# Patient Record
Sex: Female | Born: 1950
Health system: Southern US, Community
[De-identification: ages and names within clinical notes are randomized; demographics above are authoritative.]

## PROBLEM LIST (undated history)

## (undated) DIAGNOSIS — H905 Unspecified sensorineural hearing loss: Secondary | ICD-10-CM

## (undated) DIAGNOSIS — Z9889 Other specified postprocedural states: Secondary | ICD-10-CM

## (undated) DIAGNOSIS — K648 Other hemorrhoids: Secondary | ICD-10-CM

## (undated) DIAGNOSIS — K759 Inflammatory liver disease, unspecified: Secondary | ICD-10-CM

## (undated) DIAGNOSIS — K824 Cholesterolosis of gallbladder: Secondary | ICD-10-CM

## (undated) DIAGNOSIS — Z974 Presence of external hearing-aid: Secondary | ICD-10-CM

## (undated) DIAGNOSIS — M1990 Unspecified osteoarthritis, unspecified site: Secondary | ICD-10-CM

## (undated) DIAGNOSIS — K219 Gastro-esophageal reflux disease without esophagitis: Secondary | ICD-10-CM

## (undated) DIAGNOSIS — N8110 Cystocele, unspecified: Secondary | ICD-10-CM

## (undated) DIAGNOSIS — I471 Supraventricular tachycardia, unspecified: Secondary | ICD-10-CM

## (undated) DIAGNOSIS — I10 Essential (primary) hypertension: Secondary | ICD-10-CM

## (undated) DIAGNOSIS — Z86010 Personal history of colonic polyps: Secondary | ICD-10-CM

## (undated) DIAGNOSIS — F419 Anxiety disorder, unspecified: Secondary | ICD-10-CM

## (undated) DIAGNOSIS — G43909 Migraine, unspecified, not intractable, without status migrainosus: Secondary | ICD-10-CM

## (undated) DIAGNOSIS — T7840XA Allergy, unspecified, initial encounter: Secondary | ICD-10-CM

## (undated) DIAGNOSIS — R768 Other specified abnormal immunological findings in serum: Secondary | ICD-10-CM

## (undated) DIAGNOSIS — E162 Hypoglycemia, unspecified: Secondary | ICD-10-CM

## (undated) DIAGNOSIS — R7689 Other specified abnormal immunological findings in serum: Secondary | ICD-10-CM

## (undated) DIAGNOSIS — G4730 Sleep apnea, unspecified: Secondary | ICD-10-CM

## (undated) DIAGNOSIS — N83209 Unspecified ovarian cyst, unspecified side: Secondary | ICD-10-CM

## (undated) DIAGNOSIS — I4729 Other ventricular tachycardia: Secondary | ICD-10-CM

## (undated) DIAGNOSIS — R112 Nausea with vomiting, unspecified: Secondary | ICD-10-CM

## (undated) HISTORY — DX: Unspecified osteoarthritis, unspecified site: M19.90

## (undated) HISTORY — DX: Cholesterolosis of gallbladder: K82.4

## (undated) HISTORY — DX: Supraventricular tachycardia, unspecified: I47.10

## (undated) HISTORY — DX: Other ventricular tachycardia: I47.29

## (undated) HISTORY — DX: Gastro-esophageal reflux disease without esophagitis: K21.9

## (undated) HISTORY — DX: Essential (primary) hypertension: I10

## (undated) HISTORY — DX: Other specified abnormal immunological findings in serum: R76.89

## (undated) HISTORY — DX: Personal history of colonic polyps: Z86.010

## (undated) HISTORY — DX: Sleep apnea, unspecified: G47.30

## (undated) HISTORY — DX: Cystocele, unspecified: N81.10

## (undated) HISTORY — DX: Other hemorrhoids: K64.8

## (undated) HISTORY — DX: Allergy, unspecified, initial encounter: T78.40XA

## (undated) HISTORY — DX: Migraine, unspecified, not intractable, without status migrainosus: G43.909

## (undated) HISTORY — DX: Unspecified ovarian cyst, unspecified side: N83.209

## (undated) HISTORY — PX: SIGMOIDOSCOPY: SUR1295

## (undated) HISTORY — PX: OTHER SURGICAL HISTORY: SHX169

## (undated) HISTORY — DX: Other specified abnormal immunological findings in serum: R76.8

## (undated) HISTORY — PX: COLONOSCOPY: SHX174

---

## 1998-08-03 ENCOUNTER — Encounter: Admission: RE | Admit: 1998-08-03 | Discharge: 1998-11-01 | Payer: Self-pay | Admitting: Family Medicine

## 1999-04-02 ENCOUNTER — Emergency Department (HOSPITAL_COMMUNITY): Admission: EM | Admit: 1999-04-02 | Discharge: 1999-04-03 | Payer: Self-pay

## 1999-04-03 ENCOUNTER — Encounter: Payer: Self-pay | Admitting: Emergency Medicine

## 1999-04-03 DIAGNOSIS — N83209 Unspecified ovarian cyst, unspecified side: Secondary | ICD-10-CM

## 1999-04-03 HISTORY — DX: Unspecified ovarian cyst, unspecified side: N83.209

## 2000-01-16 HISTORY — PX: OOPHORECTOMY: SHX86

## 2000-02-05 ENCOUNTER — Encounter: Admission: RE | Admit: 2000-02-05 | Discharge: 2000-02-05 | Payer: Self-pay | Admitting: *Deleted

## 2000-02-14 ENCOUNTER — Encounter: Admission: RE | Admit: 2000-02-14 | Discharge: 2000-02-14 | Payer: Self-pay | Admitting: *Deleted

## 2000-09-23 ENCOUNTER — Other Ambulatory Visit: Admission: RE | Admit: 2000-09-23 | Discharge: 2000-09-23 | Payer: Self-pay | Admitting: *Deleted

## 2001-08-04 ENCOUNTER — Encounter: Admission: RE | Admit: 2001-08-04 | Discharge: 2001-08-04 | Payer: Self-pay | Admitting: *Deleted

## 2001-09-25 ENCOUNTER — Other Ambulatory Visit: Admission: RE | Admit: 2001-09-25 | Discharge: 2001-09-25 | Payer: Self-pay | Admitting: *Deleted

## 2002-08-14 ENCOUNTER — Encounter: Admission: RE | Admit: 2002-08-14 | Discharge: 2002-08-14 | Payer: Self-pay | Admitting: *Deleted

## 2003-01-06 ENCOUNTER — Other Ambulatory Visit: Admission: RE | Admit: 2003-01-06 | Discharge: 2003-01-06 | Payer: Self-pay | Admitting: *Deleted

## 2004-12-04 ENCOUNTER — Encounter: Payer: Self-pay | Admitting: Cardiovascular Disease

## 2005-03-22 ENCOUNTER — Encounter: Payer: Self-pay | Admitting: Cardiovascular Disease

## 2007-07-18 ENCOUNTER — Encounter: Payer: Self-pay | Admitting: Cardiovascular Disease

## 2009-11-24 ENCOUNTER — Encounter: Admission: RE | Admit: 2009-11-24 | Discharge: 2009-11-24 | Payer: Self-pay | Admitting: Obstetrics & Gynecology

## 2009-11-29 ENCOUNTER — Encounter: Admission: RE | Admit: 2009-11-29 | Discharge: 2009-11-29 | Payer: Self-pay | Admitting: Obstetrics & Gynecology

## 2010-05-03 ENCOUNTER — Encounter: Payer: Self-pay | Admitting: *Deleted

## 2010-05-04 ENCOUNTER — Ambulatory Visit (INDEPENDENT_AMBULATORY_CARE_PROVIDER_SITE_OTHER): Payer: BC Managed Care – PPO | Admitting: Cardiovascular Disease

## 2010-05-04 ENCOUNTER — Encounter: Payer: Self-pay | Admitting: Cardiovascular Disease

## 2010-05-04 DIAGNOSIS — R079 Chest pain, unspecified: Secondary | ICD-10-CM | POA: Insufficient documentation

## 2010-05-04 NOTE — Patient Instructions (Signed)
Your physician has requested that you have a stress echocardiogram. For further information please visit [email or web address]. Please follow instruction sheet as given.   

## 2010-05-04 NOTE — Progress Notes (Signed)
60 yo seen at the request of Shaune Pollack for SSCP.  Reviewed extensive records from [facility].  CRF;s elevated lipids.  Does not want to be on statins because of history of hep C and previously on Zetia.  No trying to manage with diet and exercise.  Indicates family history of CAD but fathers CABG was in 70s/80s.  Records indicate normal funtional study in 2005, 2006, 2009 including cardiac CT 2007 with left dominant circulation and calcium score of 0.  Pain is atypical right sided radiating to neck.  Occurs usually at rest.  Rides a bicycle and no pains.  Has had lots of anxiety in post regarding caring for family members and settling an estate in [place].  No dyspnea, palpiations, syncope or edema.    ROS: Denies fever, malais, weight loss, blurry vision, decreased visual acuity, cough, sputum, SOB, hemoptysis, pleuritic pain, palpitaitons, heartburn, abdominal pain, melena, lower extremity edema, claudication, or rash.   General: Affect appropriate Healthy:  appears stated age HEENT: normal Neck supple with no adenopathy JVP normal no bruits no thyromegaly Lungs clear with no wheezing and good diaphragmatic motion Heart:  S1/S2 no murmur,rub, gallop or click PMI normal Abdomen: benighn, BS positve, no tenderness, no AAA no bruit.  No HSM or HJR Distal pulses intact with no bruits No edema Neuro non-focal Skin warm and dry No muscular weakness  Medications Current Outpatient Prescriptions  Medication Sig Dispense Refill  . acyclovir (ZOVIRAX) 200 MG capsule Take by mouth as needed.        . ALPRAZolam (XANAX) 0.5 MG tablet Take 0.5 mg by mouth at bedtime as needed.        Marland Kitchen aspirin 81 MG tablet Take 81 mg by mouth daily.        Marland Kitchen atenolol (TENORMIN) 50 MG tablet 1/2 tab po bid      . celecoxib (CELEBREX) 100 MG capsule Take 100 mg by mouth daily.        . Flaxseed, Linseed, (FLAX SEEDS) POWD As directed       . Omega-3 Fatty Acids (FISH OIL) 1000 MG CAPS 1 tab po qd       .  SUMAtriptan (IMITREX) 100 MG tablet Take 100 mg by mouth every 2 (two) hours as needed.        . traZODone (DESYREL) 150 MG tablet Take 150 mg by mouth as needed.        . zolpidem (AMBIEN) 10 MG tablet Take 10 mg by mouth at bedtime as needed.        Marland Kitchen DISCONTD: b complex vitamins tablet Take 1 tablet by mouth daily.        Marland Kitchen DISCONTD: Bioflavonoid Products (ESTER C PO) Take by mouth daily.        Marland Kitchen DISCONTD: Coenzyme Q10 (CO Q 10 PO) Take by mouth daily.        Marland Kitchen DISCONTD: FOLIC ACID PO Take by mouth daily.        Marland Kitchen DISCONTD: GARLIC PO Take by mouth daily.        Marland Kitchen DISCONTD: levOCARNitine (CARNITOR) 330 MG tablet Take 330 mg by mouth daily.        Marland Kitchen DISCONTD: milk thistle 175 MG tablet Take 175 mg by mouth daily.        Marland Kitchen DISCONTD: MILK THISTLE PO Take by mouth daily.        Marland Kitchen DISCONTD: Multiple Vitamins-Minerals (ZINC PO) Take by mouth.        . DISCONTD:  Red Yeast Rice Extract (RED YEAST RICE PO) Take by mouth daily.        Marland Kitchen DISCONTD: TRYPTOPHAN PO Take by mouth daily.         Single: No family in area [place] sister.  Mom and dad passed Not working  Exercises regularly.   Former smoker No ETOH  History of anxiety  Allergies Statins  Family History: History reviewed. No pertinent family history.  Social History:   Electrocardiogram:  Sinus bradycardia 55 normal ECG   Assessment and Plan

## 2010-05-04 NOTE — Progress Notes (Signed)
Addended by: Deliah Goody on: 05/04/2010 03:26 PM   Modules accepted: Orders

## 2010-05-04 NOTE — Progress Notes (Signed)
Addended by: Deliah Goody on: 05/04/2010 03:00 PM   Modules accepted: Orders

## 2010-05-04 NOTE — Assessment & Plan Note (Signed)
Atypical pain, with normal ECG.  Multiple previous normal functional studies including CT, myovue and stress echo.  Try to enroll in Promise trial.  If not in CT arm consdier stress echo or ETT

## 2010-05-05 LAB — BASIC METABOLIC PANEL
GFR: 94.57 mL/min (ref >60.00)
Potassium: 3.9 mEq/L (ref 3.5–5.1)
Sodium: 137 mEq/L (ref 135–145)

## 2010-05-08 ENCOUNTER — Other Ambulatory Visit: Payer: Self-pay | Admitting: Cardiovascular Disease

## 2010-05-10 ENCOUNTER — Encounter: Payer: Self-pay | Admitting: Cardiovascular Disease

## 2010-05-19 ENCOUNTER — Encounter: Payer: Self-pay | Admitting: Cardiovascular Disease

## 2010-05-22 ENCOUNTER — Inpatient Hospital Stay (HOSPITAL_COMMUNITY): Admission: RE | Admit: 2010-05-22 | Payer: BC Managed Care – PPO | Source: Ambulatory Visit

## 2010-06-06 ENCOUNTER — Other Ambulatory Visit (HOSPITAL_COMMUNITY): Payer: BC Managed Care – PPO | Admitting: Radiology

## 2010-06-06 ENCOUNTER — Ambulatory Visit (HOSPITAL_COMMUNITY)
Admission: RE | Admit: 2010-06-06 | Discharge: 2010-06-06 | Disposition: A | Discharge status: Home / Self Care | Payer: BC Managed Care – PPO | Source: Ambulatory Visit | Attending: Cardiovascular Disease | Admitting: Cardiovascular Disease

## 2010-06-06 DIAGNOSIS — R079 Chest pain, unspecified: Secondary | ICD-10-CM

## 2010-06-06 DIAGNOSIS — R0989 Other specified symptoms and signs involving the circulatory and respiratory systems: Secondary | ICD-10-CM

## 2010-06-06 MED ORDER — IOHEXOL 350 MG/ML SOLN
80.0000 mL | Freq: Once | INTRAVENOUS | Status: AC | PRN
  Administered 2010-06-06: 80 mL via INTRAVENOUS

## 2010-06-08 ENCOUNTER — Other Ambulatory Visit (HOSPITAL_COMMUNITY): Payer: BC Managed Care – PPO | Admitting: Radiology

## 2011-02-12 ENCOUNTER — Encounter: Payer: Self-pay | Admitting: *Deleted

## 2011-02-21 ENCOUNTER — Ambulatory Visit (INDEPENDENT_AMBULATORY_CARE_PROVIDER_SITE_OTHER): Payer: BC Managed Care – PPO | Admitting: Internal Medicine

## 2011-02-21 ENCOUNTER — Encounter: Payer: Self-pay | Admitting: Internal Medicine

## 2011-02-21 ENCOUNTER — Other Ambulatory Visit (INDEPENDENT_AMBULATORY_CARE_PROVIDER_SITE_OTHER): Payer: BC Managed Care – PPO

## 2011-02-21 DIAGNOSIS — R1319 Other dysphagia: Secondary | ICD-10-CM

## 2011-02-21 DIAGNOSIS — K219 Gastro-esophageal reflux disease without esophagitis: Secondary | ICD-10-CM

## 2011-02-21 LAB — COMPREHENSIVE METABOLIC PANEL
Albumin: 4.2 g/dL (ref 3.5–5.2)
Alkaline Phosphatase: 63 U/L (ref 39–117)
BUN: 22 mg/dL (ref 6–23)
Creatinine, Ser: 0.8 mg/dL (ref 0.4–1.2)
Glucose, Bld: 98 mg/dL (ref 70–99)
Total Bilirubin: 0.3 mg/dL (ref 0.3–1.2)

## 2011-02-21 LAB — CBC WITH DIFFERENTIAL/PLATELET
Basophils Relative: 0.5 % (ref 0.0–3.0)
Eosinophils Relative: 6.3 % — ABNORMAL HIGH (ref 0.0–5.0)
HCT: 37.3 % (ref 36.0–46.0)
MCV: 90.6 fl (ref 78.0–100.0)
Monocytes Absolute: 0.5 10*3/uL (ref 0.1–1.0)
Neutrophils Relative %: 47.7 % (ref 43.0–77.0)
RBC: 4.11 Mil/uL (ref 3.87–5.11)
WBC: 6 10*3/uL (ref 4.5–10.5)

## 2011-02-21 MED ORDER — PEG-KCL-NACL-NASULF-NA ASC-C 100 G PO SOLR: 1.0000 | Freq: Once | ORAL | Status: DC

## 2011-02-21 NOTE — Patient Instructions (Signed)
You have been scheduled for an endoscopy and colonoscopy with propofol. Please follow the written instructions given to you at your visit today. Please pick up your prep at the pharmacy within the next 2-3 days. Your physician has requested that you go to the basement for the following lab work before leaving today: Celiac Panel, CMET, CBC CC: Dr Shaune Pollack

## 2011-02-21 NOTE — Progress Notes (Signed)
Alexis Braun 05/01/1950 MRN [id number]   History of Present Illness:  This is a 61 year old white female who has chronic gastroesophageal reflux for which she takes over-the-counter antacids, Prilosec and ranitidine. She has occasional dysphagia. Her brother-in-law was just diagnosed with stage 3 esophageal cancer and the patient wanted to have an endoscopy to rule out Barrett's esophagus. She used to take up to 6-8 Advil a day while doing her sports. She has crampy abdominal pain and her bowel habits are regular. She thinks she may have irritable bowel syndrome because of excess gas. She takes psyilium fiber daily. She had a colonoscopy in [place] in 2008 but the exam was incomplete per her report. Her reflux occurs daily and is mostly postprandially. She has occasional hoarseness and coughing. Wine and coffee make her symptoms worse. She has a history of hepatitis C at age 4. She has positive hepatitis C antibody but no active virus. Her liver function tests have been repeatedly normal.  Past Medical History  Diagnosis Date  . Chest pain   . Migraines   . Ovarian cyst 04/03/1999  . Positive hepatitis C antibody test     at age 83; has been told that she "self cured" No current or past treatment.  Marland Kitchen GERD (gastroesophageal reflux disease)    Past Surgical History  Procedure Date  . Oophorectomy     Right     reports that she has never smoked. She has never used smokeless tobacco. She reports that she drinks alcohol. She reports that she does not use illicit drugs. family history is negative for Colon cancer. Allergies  Allergen Reactions  . Statins     History of Hepatitis C Patient will not take them        Review of Systems: Positive for intermittent dysphagia. Chest pain. Positive for abdominal pain negative for rectal bleeding  The remainder of the 10 point ROS is negative except as outlined in H&P   Physical Exam: General appearance  Well developed, in no distress. Eyes-  non icteric. HEENT nontraumatic, normocephalic. Mouth no lesions, tongue papillated, no cheilosis. Neck supple without adenopathy, thyroid not enlarged, no carotid bruits, no JVD. Lungs Clear to auscultation bilaterally. Cor normal S1, normal S2, regular rhythm, no murmur,  quiet precordium. Abdomen: Soft, nontender abdomen with normoactive bowel sounds. No distention. Liver edge at costal margin. Rectal: Soft Hemoccult negative stool Extremities no pedal edema. Skin no lesions. No stigmata of chronic liver disease Neurological alert and oriented x 3. Psychological normal mood and affect.  Assessment and Plan:  Problem #1 Chronic gastroesophageal reflux with suggestion of dysphagia may be due to esophagitis or possibly a mild esophageal stricture. She has not taken her medications consistently. It is appropriate to go ahead with an upper endoscopy and biopsies to rule out Barrett's esophagus. I have given her samples of Nexium 40 mg daily. We will plan to check for H. pylori and will also complete small bowel biopsies for villous atrophy.  Problem #2 Colorectal screening. She has symptoms suggestive of irritable bowel syndrome. Because of her age of 32 and history of incomplete colonoscopy, we will proceed with a screening colonoscopy. She will continue her fiber supplements.   02/21/2011 Lina Sar

## 2011-02-22 ENCOUNTER — Telehealth: Payer: Self-pay | Admitting: *Deleted

## 2011-02-22 LAB — CELIAC PANEL 10
Endomysial Screen: NEGATIVE
Gliadin IgG: 4.2 U/mL (ref <20)
IgA: 73 mg/dL (ref 69–380)
Tissue Transglutaminase Ab, IgA: 2.7 U/mL (ref <20)

## 2011-02-22 NOTE — Telephone Encounter (Signed)
Message copied by Daphine Deutscher on Thu Feb 22, 2011  2:36 PM ------      Message from: Hart Carwin      Created: Wed Feb 21, 2011 11:22 PM       Please call pt with normal blood tests

## 2011-02-22 NOTE — Telephone Encounter (Signed)
Spoke with patient and gave her results.

## 2011-02-23 ENCOUNTER — Telehealth: Payer: Self-pay | Admitting: *Deleted

## 2011-02-23 NOTE — Telephone Encounter (Signed)
Message copied by Daphine Deutscher on Fri Feb 23, 2011  3:09 PM ------      Message from: Hart Carwin      Created: Thu Feb 22, 2011  5:54 PM       Please call pt with negative celiac panel

## 2011-02-23 NOTE — Telephone Encounter (Signed)
Patient notified of results as per Dr. Brodie. 

## 2011-03-14 ENCOUNTER — Telehealth: Payer: Self-pay | Admitting: Internal Medicine

## 2011-03-14 NOTE — Telephone Encounter (Signed)
Spoke with patient and advised her that this is okay, just no more of those things until after her procedure on Friday. Patient verbalizes understanding.

## 2011-03-16 ENCOUNTER — Ambulatory Visit (AMBULATORY_SURGERY_CENTER): Payer: BC Managed Care – PPO | Admitting: Internal Medicine

## 2011-03-16 ENCOUNTER — Encounter: Payer: Self-pay | Admitting: Internal Medicine

## 2011-03-16 DIAGNOSIS — R1319 Other dysphagia: Secondary | ICD-10-CM

## 2011-03-16 DIAGNOSIS — K2100 Gastro-esophageal reflux disease with esophagitis, without bleeding: Secondary | ICD-10-CM

## 2011-03-16 DIAGNOSIS — Z1211 Encounter for screening for malignant neoplasm of colon: Secondary | ICD-10-CM

## 2011-03-16 DIAGNOSIS — K219 Gastro-esophageal reflux disease without esophagitis: Secondary | ICD-10-CM

## 2011-03-16 MED ORDER — SODIUM CHLORIDE 0.9 % IV SOLN: 500.0000 mL | INTRAVENOUS | Status: DC

## 2011-03-16 MED ORDER — ESOMEPRAZOLE MAGNESIUM 40 MG PO CPDR: DELAYED_RELEASE_CAPSULE | ORAL | Status: DC

## 2011-03-16 NOTE — Progress Notes (Signed)
Pt passed large amount of air while in the RR  Patient did not experience any of the following events: a burn prior to discharge; a fall within the facility; wrong site/side/patient/procedure/implant event; or a hospital transfer or hospital admission upon discharge from the facility. [phone]) Patient did not have preoperative order for IV antibiotic SSI prophylaxis. [phone])

## 2011-03-16 NOTE — Progress Notes (Signed)
Propofol was administered by Shon Hough, CRNA. Maw  The pt tolerated the egd with dilatation very well. Maw  Pt's heart rate dropped to 32 per [facility], CRNA and she gave the pt Robinal 0.2mg  IV.  Pt's heart increased with in couple of mins to upper 40's. Maw  [phone] hung second bag on normal saline 0.9% 500 ml. Maw  The pt tolerated the colonoscopy very well. Maw

## 2011-03-16 NOTE — Patient Instructions (Signed)
YOU HAD AN ENDOSCOPIC PROCEDURE TODAY AT THE [facility]: Refer to the procedure report that was given to you for any specific questions about what was found during the examination.  If the procedure report does not answer your questions, please call your gastroenterologist to clarify.  If you requested that your care partner not be given the details of your procedure findings, then the procedure report has been included in a sealed envelope for you to review at your convenience later.  YOU SHOULD EXPECT: Some feelings of bloating in the abdomen. Passage of more gas than usual.  Walking can help get rid of the air that was put into your GI tract during the procedure and reduce the bloating. If you had a lower endoscopy (such as a colonoscopy or flexible sigmoidoscopy) you may notice spotting of blood in your stool or on the toilet paper. If you underwent a bowel prep for your procedure, then you may not have a normal bowel movement for a few days.  DIET: FOLLOW DILATION DIET TODAY- SEE HANDOUT.  Drink plenty of fluids but you should avoid alcoholic beverages for 24 hours.  ACTIVITY: Your care partner should take you home directly after the procedure.  You should plan to take it easy, moving slowly for the rest of the day.  You can resume normal activity the day after the procedure however you should NOT DRIVE or use heavy machinery for 24 hours (because of the sedation medicines used during the test).    SYMPTOMS TO REPORT IMMEDIATELY: A gastroenterologist can be reached at any hour.  During normal business hours, 8:30 AM to 5:00 PM Monday through Friday, call [phone].  After hours and on weekends, please call the GI answering service at [phone] who will take a message and have the physician on call contact you.   Following lower endoscopy (colonoscopy or flexible sigmoidoscopy):  Excessive amounts of blood in the stool  Significant tenderness or worsening of abdominal  pains  Swelling of the abdomen that is new, acute  Fever of 100F or higher  Following upper endoscopy (EGD)  Vomiting of blood or coffee ground material  New chest pain or pain under the shoulder blades  Painful or persistently difficult swallowing  New shortness of breath  Fever of 100F or higher  Black, tarry-looking stools  FOLLOW UP: If any biopsies were taken you will be contacted by phone or by letter within the next 1-3 weeks.  Call your gastroenterologist if you have not heard about the biopsies in 3 weeks.  Our staff will call the home number listed on your records the next business day following your procedure to check on you and address any questions or concerns that you may have at that time regarding the information given to you following your procedure. This is a courtesy call and so if there is no answer at the home number and we have not heard from you through the emergency physician on call, we will assume that you have returned to your regular daily activities without incident.  SIGNATURES/CONFIDENTIALITY: You and/or your care partner have signed paperwork which will be entered into your electronic medical record.  These signatures attest to the fact that that the information above on your After Visit Summary has been reviewed and is understood.  Full responsibility of the confidentiality of this discharge information lies with you and/or your care-partner.   Follow up colonoscopy in 10 years  Follow dilation diet today

## 2011-03-16 NOTE — Op Note (Signed)
[facility] [address] [place], [place]  [address]  ENDOSCOPY PROCEDURE REPORT  PATIENT:  Alexis Braun, Alexis Braun  MR#:  [id number] BIRTHDATE:  05-18-1950, 60 yrs. old  GENDER:  female  ENDOSCOPIST:  Hedwig Morton. Juanda Chance, MD Referred by:  Shaune Pollack, M.D.  PROCEDURE DATE:  03/16/2011 PROCEDURE:  EGD with biopsy, 43239, Maloney Dilation of Esophagus ASA CLASS:  Class I INDICATIONS:  dysphagia, cough, heartburn refractory to PPI  MEDICATIONS:   MAC sedation, administered by CRNA, propofol (Diprivan) 200 mg TOPICAL ANESTHETIC:  none  DESCRIPTION OF PROCEDURE:   After the risks benefits and alternatives of the procedure were thoroughly explained, informed consent was obtained.  The LB [id number]-H180 [id number] endoscope was introduced through the mouth and advanced to the second portion of the duodenum, without limitations.  The instrument was slowly withdrawn as the mucosa was fully examined. <<PROCEDUREIMAGES>>  Esophagitis was found in the distal esophagus. Grade 1 esophagitis, 3 mm erosions at g-e junction With standard forceps, a biopsy was obtained and sent to pathology (see image2, image6, and image7).  A hiatal hernia was found (see image1 and image2). 2 cm hh, reducible maloney dilator 84F Maloney dil passed without difficulty  Otherwise the examination was normal (see image3, image4, and image5).    Retroflexed views revealed no abnormalities.    The scope was then withdrawn from the patient and the procedure completed.  COMPLICATIONS:  None  ENDOSCOPIC IMPRESSION: 1) Esophagitis in the distal esophagus 2) Hiatal hernia 3) Otherwise normal examination Grade 1 esophagitis, no stricture, s/p passage of 84F Maloney dilator RECOMMENDATIONS: 1) Anti-reflux regimen to be follow 2) Await biopsy results Nexiem 40 mg 1 po qd  REPEAT EXAM:  In 0 year(s) for.  ______________________________ Hedwig Morton. Juanda Chance, MD  CC:  n. eSIGNED:   Hedwig Morton. Kilani Joffe at 03/16/2011 10:00  AM  Signe Colt, [phone]

## 2011-03-16 NOTE — Op Note (Addendum)
[facility] [address] [place], [place]  [address]  COLONOSCOPY PROCEDURE REPORT  PATIENT:  Alexis, Braun  MR#:  [id number] BIRTHDATE:  07-20-1950, 60 yrs. old  GENDER:  female ENDOSCOPIST:  Hedwig Morton. Juanda Chance, MD REF. BY:  Shaune Pollack, M.D. PROCEDURE DATE:  03/16/2011 PROCEDURE:  Colonoscopy 62952 ASA CLASS:  Class I INDICATIONS:  colorectal cancer screening, average risk pt had an incomplete colon in 2008 in [place] MEDICATIONS:   MAC sedation, administered by CRNA, propofol (Diprivan) 300 mg, Robinul .2 mg IV  DESCRIPTION OF PROCEDURE:   After the risks and benefits and of the procedure were explained, informed consent was obtained. Digital rectal exam was performed and revealed no rectal masses. The LB [id number] endoscope was introduced through the anus and advanced to the cecum, which was identified by both the appendix and ileocecal valve.  The quality of the prep was good, using MoviPrep.  The instrument was then slowly withdrawn as the colon was fully examined. <<PROCEDUREIMAGES>>  FINDINGS:  No polyps or cancers were seen (see image1, image2, image3, image4, and image5).   Retroflexed views in the rectum revealed no abnormalities.    The scope was then withdrawn from the patient and the procedure completed.  COMPLICATIONS:  None ENDOSCOPIC IMPRESSION: 1) No polyps or cancers 2) Normal colonoscopy RECOMMENDATIONS: 1) High fiber diet.  REPEAT EXAM:  In 10 year(s) for.  ______________________________ Hedwig Morton. Juanda Chance, MD  CC:  n. REVISED:  03/16/2011 10:27 AM eSIGNED:   Hedwig Morton. Merton Wadlow at 03/16/2011 10:27 AM  Signe Colt, [phone]

## 2011-03-19 ENCOUNTER — Telehealth: Payer: Self-pay

## 2011-03-19 NOTE — Telephone Encounter (Signed)
Left message on answering machine. 

## 2011-03-27 ENCOUNTER — Encounter: Payer: Self-pay | Admitting: Internal Medicine

## 2011-04-22 ENCOUNTER — Telehealth: Payer: Self-pay | Admitting: Physician Assistant

## 2011-04-22 NOTE — Telephone Encounter (Signed)
Pt called because she had some chest pain. It started after exertion and lasted about 5 minutes. She took an aspirin and the pain resolved without further intervention. She was concerned about her pain and wanted to know if she should come to the ER.  Advised pt that with a recent calcium score of zero and a cardiac CT that did not show any stenosis, the likelihood this is cardiac pain is almost zero. Advised her that pain should be treated and if she wanted further evaluation, she should come to the ER but it would be hard to determine the cause of her pain since it has resolved. Discussed risk factors for DVT/PE but the pt is at very low risk for these. Pt was reassured that her pain was unlikely to be coming from anything life-threatening but still would appreciate a call in am.

## 2011-04-26 ENCOUNTER — Telehealth: Payer: Self-pay | Admitting: Cardiovascular Disease

## 2011-04-26 NOTE — Telephone Encounter (Signed)
Patient called, stating she has been having rt sided chest pain off and on since yesterday 04/25/11.States chest pain is a dull pain and sometimes radiates up into jaw.States she walked 3 miles this morning and noticed chest pain x 2.No sob.States has appointment with Dr.Nishan 05/01/11.No chest pain at present.Advised to keep appointment with Dr.Nishan 05/01/11.Advised to go to ER if needed.

## 2011-04-26 NOTE — Telephone Encounter (Signed)
New msg Pt said she is having chest pain

## 2011-05-01 ENCOUNTER — Encounter: Payer: Self-pay | Admitting: Cardiovascular Disease

## 2011-05-01 ENCOUNTER — Ambulatory Visit (INDEPENDENT_AMBULATORY_CARE_PROVIDER_SITE_OTHER): Payer: BC Managed Care – PPO | Admitting: Cardiovascular Disease

## 2011-05-01 DIAGNOSIS — R079 Chest pain, unspecified: Secondary | ICD-10-CM

## 2011-05-01 DIAGNOSIS — E782 Mixed hyperlipidemia: Secondary | ICD-10-CM | POA: Insufficient documentation

## 2011-05-01 NOTE — Patient Instructions (Signed)
Your physician wants you to follow-up in: YEAR WITH DR Haywood Filler will receive a reminder letter in the mail two months in advance. If you don't receive a letter, please call our office to schedule the follow-up appointment. Your physician recommends that you continue on your current medications as directed. Please refer to the Current Medication list given to you today. Your physician recommends that you return for lab work in: FASTING LIPID LIVER  WILL DO  AT [facility] OFF[facility]  DX 272.4 [phone]

## 2011-05-01 NOTE — Assessment & Plan Note (Signed)
Will get labs this week.  She has chose not to be on statins in the past

## 2011-05-01 NOTE — Assessment & Plan Note (Signed)
Atypical.  Multiple previously normal nuclear studies and cardiac CT most recent 4/12.  ECG reviewed today normal  Observe

## 2011-05-01 NOTE — Progress Notes (Signed)
61 yo seen at the request of Shaune Pollack for SSCP. Reviewed extensive records from [facility]. CRF;s elevated lipids. Does not want to be on statins because of history of hep C and previously on Zetia. No trying to manage with diet and exercise. Indicates family history of CAD but fathers CABG was in 70s/80s. Records indicate normal funtional study in 2005, 2006, 2009 including cardiac CT 2007 with left dominant circulation and calcium score of 0.    Had cardiac CT here 4/12 normal left dominant circulation with calcium score of 0  ROS: Denies fever, malais, weight loss, blurry vision, decreased visual acuity, cough, sputum, SOB, hemoptysis, pleuritic pain, palpitaitons, heartburn, abdominal pain, melena, lower extremity edema, claudication, or rash.  All other systems reviewed and negative  General: Affect appropriate Healthy:  appears stated age HEENT: normal Neck supple with no adenopathy JVP normal no bruits no thyromegaly Lungs clear with no wheezing and good diaphragmatic motion Heart:  S1/S2 no murmur, no rub, gallop or click PMI normal Abdomen: benighn, BS positve, no tenderness, no AAA no bruit.  No HSM or HJR Distal pulses intact with no bruits No edema Neuro non-focal Skin warm and dry No muscular weakness   Current Outpatient Prescriptions  Medication Sig Dispense Refill  . acyclovir (ZOVIRAX) 200 MG capsule Take by mouth as needed.        . ALPRAZolam (XANAX) 0.5 MG tablet Take 0.5 mg by mouth at bedtime as needed.        Marland Kitchen aspirin 81 MG tablet Take 81 mg by mouth daily.        Marland Kitchen atenolol (TENORMIN) 50 MG tablet 1/2 tab po bid      . desoximetasone (TOPICORT) 0.05 % cream Apply topically as directed.      Marland Kitchen esomeprazole (NEXIUM) 40 MG capsule Nexium as directed  30 capsule  10  . estradiol (ESTRACE) 0.1 MG/GM vaginal cream Place vaginally as directed.       . Flaxseed, Linseed, (FLAX SEEDS) POWD Take 1 tablet by mouth daily. As directed      . Omega-3 Fatty Acids  (FISH OIL) 1000 MG CAPS 1 tab po qd       . SUMAtriptan (IMITREX) 100 MG tablet Take 100 mg by mouth as directed.       . traZODone (DESYREL) 50 MG tablet Take 50 mg by mouth as needed.       . zolpidem (AMBIEN) 10 MG tablet Take 10 mg by mouth at bedtime as needed.          Allergies  Statins and Sulfa antibiotics  Electrocardiogram:  NSR rate 61 normal ECG  Assessment and Plan

## 2011-06-04 ENCOUNTER — Other Ambulatory Visit: Payer: Self-pay | Admitting: Dermatology

## 2011-09-21 ENCOUNTER — Other Ambulatory Visit: Payer: Self-pay | Admitting: Obstetrics & Gynecology

## 2011-09-21 DIAGNOSIS — Z1231 Encounter for screening mammogram for malignant neoplasm of breast: Secondary | ICD-10-CM

## 2011-09-24 ENCOUNTER — Ambulatory Visit
Admission: RE | Admit: 2011-09-24 | Discharge: 2011-09-24 | Disposition: A | Discharge status: Home / Self Care | Payer: BC Managed Care – PPO | Source: Ambulatory Visit | Attending: Obstetrics & Gynecology | Admitting: Obstetrics & Gynecology

## 2011-09-24 DIAGNOSIS — Z1231 Encounter for screening mammogram for malignant neoplasm of breast: Secondary | ICD-10-CM

## 2012-01-04 ENCOUNTER — Other Ambulatory Visit: Payer: Self-pay | Admitting: Family Medicine

## 2012-01-04 DIAGNOSIS — R109 Unspecified abdominal pain: Secondary | ICD-10-CM

## 2012-01-16 DIAGNOSIS — K824 Cholesterolosis of gallbladder: Secondary | ICD-10-CM

## 2012-01-16 HISTORY — DX: Cholesterolosis of gallbladder: K82.4

## 2012-01-24 ENCOUNTER — Ambulatory Visit
Admission: RE | Admit: 2012-01-24 | Discharge: 2012-01-24 | Disposition: A | Discharge status: Home / Self Care | Payer: BC Managed Care – PPO | Source: Ambulatory Visit | Attending: Family Medicine | Admitting: Family Medicine

## 2012-01-24 DIAGNOSIS — R109 Unspecified abdominal pain: Secondary | ICD-10-CM

## 2012-01-25 ENCOUNTER — Telehealth: Payer: Self-pay | Admitting: Internal Medicine

## 2012-01-25 NOTE — Telephone Encounter (Signed)
Left a message for patient to call me. 

## 2012-01-29 NOTE — Telephone Encounter (Signed)
Spoke with patient and she is willing to see an extender for abdominal pain, polyp in gall bladder. Scheduled with Mike Gip, PA on 01/29/14 at 10:00 AM

## 2012-01-30 ENCOUNTER — Ambulatory Visit: Payer: BC Managed Care – PPO

## 2012-01-30 ENCOUNTER — Ambulatory Visit (INDEPENDENT_AMBULATORY_CARE_PROVIDER_SITE_OTHER): Payer: BC Managed Care – PPO | Admitting: Physician Assistant

## 2012-01-30 ENCOUNTER — Encounter: Payer: Self-pay | Admitting: Physician Assistant

## 2012-01-30 VITALS — BP 120/70 | HR 68 | Ht 65.0 in | Wt 195.0 lb

## 2012-01-30 DIAGNOSIS — Z8619 Personal history of other infectious and parasitic diseases: Secondary | ICD-10-CM

## 2012-01-30 DIAGNOSIS — K219 Gastro-esophageal reflux disease without esophagitis: Secondary | ICD-10-CM

## 2012-01-30 NOTE — Patient Instructions (Addendum)
Please go to the basement level to have your labs drawn.   We will get back to you with the results.  Make a follow up with Dr. Juanda Chance in 6 months out, ( July 2014) for repeat Ultrasound.

## 2012-01-30 NOTE — Progress Notes (Signed)
Reviewed, it seems like pt ought to be on an daily acid suppressing meds. I will see her in follow up.

## 2012-01-30 NOTE — Progress Notes (Signed)
Subjective:    Patient ID: Alexis Braun, female    DOB: 10-07-1950, 62 y.o.   MRN: [id number]  HPI Alexis Braun is a pleasant 62 year old white female known to Dr. Lina Sar who had undergone colonoscopy in March of 2013 for screening and this was a normal exam. She also had upper endoscopy done to 2 complaints of dysphagia and heartburn was found to have a 2 cm hiatal hernia and mild distal esophagitis with tiny erosions. No stricture was seen but dilated #48 Jerene Dilling due  complaints of dysphagia. Patient took a course of Nexium but does not want  to stay on Nexium due to potential long-term side effects and says she has not been bothered by any heartburn or indigestion recently . She says she has made some lifestyle changes and feels that that has helped. She was seen by her primary care provider within the past month due to complaints of change in her bowel habits with loose her stools change in color of her stool with yellow her stool gas and some urgency For the most part her symptoms have resolved at this point. As part of that workup she underwent an upper abdominal ultrasound which showed a 6 mm gallbladder polyp versus non-shadowing adherent stone was no evidence of gallbladder wall thickening common bile duct normal at 3 mm liver appears normal, pancreas incompletely visualized. Patient also reports that she had history of hepatitis C as a teenager, was not treated as there was no treatment available at that time but had been told many years ago that she cleared th virus. She says she has not had any antibody testing etc. for at least 20 years and is concerned about her liver. She reports that she did have a metabolic panel done about a month ago and was told that she had normal liver enzymes. She is concerned about the possible gallbladder polyp and whether she needs any further treatment    Review of Systems  Constitutional: Negative.   HENT: Negative.   Eyes: Negative.   Respiratory:  Negative.   Cardiovascular: Negative.   Gastrointestinal: Negative.   Genitourinary: Negative.   Musculoskeletal: Negative.   Neurological: Negative.   Hematological: Negative.   Psychiatric/Behavioral: Negative.    Outpatient Encounter Prescriptions as of 01/30/2012  Medication Sig Dispense Refill  . acyclovir (ZOVIRAX) 200 MG capsule Take by mouth as needed.        . ALPRAZolam (XANAX) 0.5 MG tablet Take 0.5 mg by mouth at bedtime as needed.        Marland Kitchen aspirin 81 MG tablet Take 81 mg by mouth daily.        Marland Kitchen atenolol (TENORMIN) 50 MG tablet 1/2 tab po bid      . desoximetasone (TOPICORT) 0.05 % cream Apply topically as directed.      Marland Kitchen esomeprazole (NEXIUM) 40 MG capsule Nexium as directed  30 capsule  10  . estradiol (ESTRACE) 0.1 MG/GM vaginal cream Place vaginally as directed.       . Flaxseed, Linseed, (FLAX SEEDS) POWD Take 1 tablet by mouth daily. As directed      . Omega-3 Fatty Acids (FISH OIL) 1000 MG CAPS 1 tab po qd       . SUMAtriptan (IMITREX) 100 MG tablet Take 100 mg by mouth as directed.       . traZODone (DESYREL) 50 MG tablet Take 50 mg by mouth as needed.       . zolpidem (AMBIEN) 10 MG tablet Take 10  mg by mouth at bedtime as needed.         Allergies  Allergen Reactions  . Statins     History of Hepatitis C Patient will not take them  . Sulfa Antibiotics Other (See Comments)    Pt. Does not remember what happened, just that she had a "reaction"   Patient Active Problem List  Diagnosis  . Chest pain  . Mixed hyperlipidemia  . GERD (gastroesophageal reflux disease)   History  Substance Use Topics  . Smoking status: Never Smoker   . Smokeless tobacco: Never Used  . Alcohol Use: Yes     Comment: occ   family history is negative for Colon cancer.     Objective:   Physical Exam well-developed older white female in no acute distress, pleasant blood pressure 120/70 pulse 68 height 5 foot 5 weight 195. HEENT; nontraumatic normocephalic EOMI PERRLA sclera  anicteric,Neck; Supple; no JVD, Cardiovascular ;regular rate and rhythm with S1-S2, Pulmonary clear bilaterally, Abdomen ;soft nontender nondistended bowel sounds are active there is no palpable mass or hepatosplenomegaly, Rectal ;exam not done, Extremities; no clubbing cyanosis or edema skin warm dry, Psych; mood and affect normal and appropriate        Assessment & Plan:  #31 62 year old female with asymptomatic 6 mm gallbladder polyp versus adherent non-shadowing gallstone. #2 history of GERD with erosive esophagitis currently asymptomatic and opposed to long-term PPI therapy #3 remote history of probable hepatitis C  Plan; discussed management of intermittent reflux symptoms. She is following an antireflux diet and has the head of her bed elevated. We discussed periodic use of. PPI and also alternative of using H2 blocker such as Pepcid or Zantac as needed.  We'll check hepatitis C antibody and hep C PCR Quant  today Plan repeat ultrasound in 6 months to differentiate polyp versus gallstone and assure stability of polyp size if indeed this is a gallbladder polyp Low fat diet Patient will followup with Dr. Juanda Chance in 4-6 months for repeat ultrasound.

## 2012-02-04 LAB — HEPATITIS C RNA QUANTITATIVE: HCV Quantitative: NOT DETECTED IU/mL (ref <15)

## 2012-05-15 ENCOUNTER — Encounter (HOSPITAL_COMMUNITY): Payer: Self-pay | Admitting: *Deleted

## 2012-05-15 ENCOUNTER — Emergency Department (HOSPITAL_COMMUNITY)
Admission: EM | Admit: 2012-05-15 | Discharge: 2012-05-15 | Disposition: A | Discharge status: Home / Self Care | Payer: BC Managed Care – PPO | Attending: Emergency Medicine | Admitting: Emergency Medicine

## 2012-05-15 DIAGNOSIS — G43109 Migraine with aura, not intractable, without status migrainosus: Secondary | ICD-10-CM | POA: Insufficient documentation

## 2012-05-15 DIAGNOSIS — Z8739 Personal history of other diseases of the musculoskeletal system and connective tissue: Secondary | ICD-10-CM | POA: Insufficient documentation

## 2012-05-15 DIAGNOSIS — Z79899 Other long term (current) drug therapy: Secondary | ICD-10-CM | POA: Insufficient documentation

## 2012-05-15 DIAGNOSIS — I10 Essential (primary) hypertension: Secondary | ICD-10-CM | POA: Insufficient documentation

## 2012-05-15 DIAGNOSIS — Z7982 Long term (current) use of aspirin: Secondary | ICD-10-CM | POA: Insufficient documentation

## 2012-05-15 DIAGNOSIS — Z8742 Personal history of other diseases of the female genital tract: Secondary | ICD-10-CM | POA: Insufficient documentation

## 2012-05-15 DIAGNOSIS — Z8619 Personal history of other infectious and parasitic diseases: Secondary | ICD-10-CM | POA: Insufficient documentation

## 2012-05-15 DIAGNOSIS — Z8719 Personal history of other diseases of the digestive system: Secondary | ICD-10-CM | POA: Insufficient documentation

## 2012-05-15 LAB — CBC
HCT: 37.4 % (ref 36.0–46.0)
MCV: 84.6 fL (ref 78.0–100.0)
Platelets: 280 10*3/uL (ref 150–400)
RBC: 4.42 MIL/uL (ref 3.87–5.11)
WBC: 7.2 10*3/uL (ref 4.0–10.5)

## 2012-05-15 LAB — BASIC METABOLIC PANEL
CO2: 25 mEq/L (ref 19–32)
Chloride: 104 mEq/L (ref 96–112)
Creatinine, Ser: 0.73 mg/dL (ref 0.50–1.10)
GFR calc Af Amer: >90 mL/min (ref >90)
Potassium: 4.5 mEq/L (ref 3.5–5.1)

## 2012-05-15 MED ORDER — KETOROLAC TROMETHAMINE 30 MG/ML IJ SOLN
30.0000 mg | Freq: Once | INTRAMUSCULAR | Status: AC
  Administered 2012-05-15: 30 mg via INTRAVENOUS
  Filled 2012-05-15: qty 1

## 2012-05-15 MED ORDER — SODIUM CHLORIDE 0.9 % IV BOLUS (SEPSIS)
1000.0000 mL | Freq: Once | INTRAVENOUS | Status: AC
  Administered 2012-05-15: 1000 mL via INTRAVENOUS

## 2012-05-15 MED ORDER — METOCLOPRAMIDE HCL 5 MG/ML IJ SOLN
10.0000 mg | Freq: Once | INTRAMUSCULAR | Status: AC
  Administered 2012-05-15: 10 mg via INTRAVENOUS
  Filled 2012-05-15: qty 2

## 2012-05-15 NOTE — ED Notes (Signed)
md at bedside  Pt alert and oriented x4. Respirations even and unlabored, bilateral symmetrical rise and fall of chest. Skin warm and dry. In no acute distress. Denies needs.   

## 2012-05-15 NOTE — ED Notes (Signed)
Pt escorted to discharge window. Pt verbalized understanding discharge instructions. In no acute distress.  

## 2012-05-15 NOTE — ED Provider Notes (Signed)
History     CSN: [id number]  Arrival date & time 05/15/12  1038   First MD Initiated Contact with Patient 05/15/12 1047      Chief Complaint  Patient presents with  . facial numbness   . neuro complaints x1 week      HPI Patient reports a history of migraine headaches they're normal or localized in her right trapezius region.  She now presents with 24 hours of sharp burning electric-like pain in her right scalp.  She reports some mild changes in the right eye vision.  She denies double vision.  No ocular pain.  No recent falls or trauma.  Nothing else worsens or improves her pain.  She denies weakness of her upper lower extremities.  No chest pain or shortness of breath.  She states she feels like her right side of her head is "heavy".  No changes in her speech.  No prior history of stroke   Past Medical History  Diagnosis Date  . Chest pain   . Migraines   . Ovarian cyst 04/03/1999  . Positive hepatitis C antibody test     at age 40; has been told that she "self cured" No current or past treatment.  Marland Kitchen GERD (gastroesophageal reflux disease)   . Allergy     sulda  . Arthritis   . Hypertension     on medication    Past Surgical History  Procedure Laterality Date  . Oophorectomy      Right   . Colonoscopy      2007  . Sigmoidoscopy      1999    Family History  Problem Relation Age of Onset  . Colon cancer Neg Hx     History  Substance Use Topics  . Smoking status: Never Smoker   . Smokeless tobacco: Never Used  . Alcohol Use: Yes     Comment: occ    OB History   Grav Para Term Preterm Abortions TAB SAB Ect Mult Living                  Review of Systems  All other systems reviewed and are negative.    Allergies  Statins and Sulfa antibiotics  Home Medications   Current Outpatient Rx  Name  Route  Sig  Dispense  Refill  . ALPRAZolam (XANAX) 0.5 MG tablet   Oral   Take 0.5 mg by mouth at bedtime as needed.           Marland Kitchen aspirin 81 MG tablet  Oral   Take 81 mg by mouth daily.           Marland Kitchen atenolol (TENORMIN) 50 MG tablet      1/2 tab po bid         . estradiol (ESTRACE) 0.1 MG/GM vaginal cream   Vaginal   Place vaginally as directed.          . Flaxseed, Linseed, (FLAX SEEDS) POWD   Oral   Take 1 tablet by mouth daily. As directed         . Omega-3 Fatty Acids (FISH OIL) 1000 MG CAPS      1 tab po qd          . traZODone (DESYREL) 50 MG tablet   Oral   Take 50 mg by mouth as needed.            BP 125/93  Pulse 52  Temp(Src) 98.7 F (37.1 C) (Oral)  Resp 17  SpO2 99%  Physical Exam  Nursing note and vitals reviewed. Constitutional: She is oriented to person, place, and time. She appears well-developed and well-nourished. No distress.  HENT:  Head: Normocephalic and atraumatic.  Eyes: EOM are normal. Pupils are equal, round, and reactive to light.  Neck: Normal range of motion.  Cardiovascular: Normal rate, regular rhythm and normal heart sounds.   Pulmonary/Chest: Effort normal and breath sounds normal.  Abdominal: Soft. She exhibits no distension. There is no tenderness.  Musculoskeletal: Normal range of motion.  Neurological: She is alert and oriented to person, place, and time.  5/5 strength in major muscle groups of  bilateral upper and lower extremities. Speech normal. No facial asymetry.  Normal finger to nose bilaterally  Skin: Skin is warm and dry.  Psychiatric: She has a normal mood and affect. Judgment normal.    ED Course  Procedures (including critical care time)   Date: 05/15/2012  Rate: 54  Rhythm: normal sinus rhythm  QRS Axis: normal  Intervals: normal  ST/T Wave abnormalities: normal  Conduction Disutrbances: none  Narrative Interpretation:   Old EKG Reviewed: No prior EKG available    Labs Reviewed  BASIC METABOLIC PANEL - Abnormal; Notable for the following:    Glucose, Bld 101 (*)    All other components within normal limits  CBC   No results found.   No  diagnosis found.    MDM  1:48 PM Complete resolution of all of her symptoms after migraine cocktail.  Discharge home in good condition.  On neurologic exam.  PCP and neurology outpatient followup.        Lyanne Co, MD 05/15/12 1351

## 2012-05-15 NOTE — ED Notes (Signed)
Pt negative for stroke scale. Pt reports 1 week ago felt like "she put her hand in a socket/ electrical tingling in her head". Then pt traveled, approx 5 hours in airplane. For 2-3 days pt has had right sided head "heaviness". Today pt woke up and has had right sided facial tingling since 0800. Denies pain. Smile symmetrical, bil hand grips and leg pushes strong and equal. No slurred speech.

## 2012-05-15 NOTE — ED Notes (Signed)
Bed:WA16<BR> Expected date:<BR> Expected time:<BR> Means of arrival:<BR> Comments:<BR>

## 2012-06-25 ENCOUNTER — Encounter: Payer: Self-pay | Admitting: Cardiovascular Disease

## 2012-07-15 ENCOUNTER — Other Ambulatory Visit: Payer: Self-pay | Admitting: Family Medicine

## 2012-07-15 DIAGNOSIS — K824 Cholesterolosis of gallbladder: Secondary | ICD-10-CM

## 2012-07-23 ENCOUNTER — Other Ambulatory Visit: Payer: Self-pay | Admitting: Family Medicine

## 2012-07-23 ENCOUNTER — Telehealth: Payer: Self-pay | Admitting: Internal Medicine

## 2012-07-23 ENCOUNTER — Ambulatory Visit
Admission: RE | Admit: 2012-07-23 | Discharge: 2012-07-23 | Disposition: A | Discharge status: Home / Self Care | Payer: BC Managed Care – PPO | Source: Ambulatory Visit | Attending: Family Medicine | Admitting: Family Medicine

## 2012-07-23 DIAGNOSIS — K824 Cholesterolosis of gallbladder: Secondary | ICD-10-CM

## 2012-07-23 NOTE — Telephone Encounter (Signed)
Patient is calling for ultrasound results. Please, advise. 

## 2012-07-23 NOTE — Telephone Encounter (Signed)
From: Hart Carwin, MD Sent: 07/23/2012 4:37 PM To: Richardson Chiquito, CMA Please call pt with rwsults of ultrasound: gall bladder polyp again noted but appears larger than on last exm ( 6 to 9 mm)/ Please repeat ultrasound in 6 months ----- Message ----- From: Richardson Chiquito, CMA Sent: 07/23/2012 4:31 PM To: Hart Carwin, MD

## 2012-07-24 ENCOUNTER — Encounter: Payer: Self-pay | Admitting: *Deleted

## 2012-07-24 NOTE — Telephone Encounter (Signed)
Patient given results. She would like to come in and discuss results with Dr. Juanda Chance.She is concerned about the increase in size of GB polyp. Scheduled on 08/05/12 at 8:45 AM.

## 2012-07-24 NOTE — Telephone Encounter (Signed)
OK 

## 2012-08-05 ENCOUNTER — Telehealth (INDEPENDENT_AMBULATORY_CARE_PROVIDER_SITE_OTHER): Payer: Self-pay | Admitting: General Surgery

## 2012-08-05 ENCOUNTER — Encounter: Payer: Self-pay | Admitting: Internal Medicine

## 2012-08-05 ENCOUNTER — Other Ambulatory Visit (INDEPENDENT_AMBULATORY_CARE_PROVIDER_SITE_OTHER): Payer: BC Managed Care – PPO

## 2012-08-05 ENCOUNTER — Ambulatory Visit (INDEPENDENT_AMBULATORY_CARE_PROVIDER_SITE_OTHER): Payer: BC Managed Care – PPO | Admitting: Internal Medicine

## 2012-08-05 ENCOUNTER — Ambulatory Visit (INDEPENDENT_AMBULATORY_CARE_PROVIDER_SITE_OTHER): Payer: BC Managed Care – PPO | Admitting: Surgery

## 2012-08-05 VITALS — BP 132/80 | HR 64 | Ht 65.0 in | Wt 187.8 lb

## 2012-08-05 DIAGNOSIS — R932 Abnormal findings on diagnostic imaging of liver and biliary tract: Secondary | ICD-10-CM

## 2012-08-05 DIAGNOSIS — K824 Cholesterolosis of gallbladder: Secondary | ICD-10-CM

## 2012-08-05 LAB — HEPATIC FUNCTION PANEL
ALT: 21 U/L (ref 0–35)
AST: 21 U/L (ref 0–37)
Alkaline Phosphatase: 76 U/L (ref 39–117)
Total Bilirubin: 0.4 mg/dL (ref 0.3–1.2)

## 2012-08-05 NOTE — Telephone Encounter (Signed)
LMOM for patient to call and ask for [facility] patient needs to R/S

## 2012-08-05 NOTE — Patient Instructions (Addendum)
Your physician has requested that you go to the basement for the following lab work before leaving today: Hepatic Function Panel  CC: Dr Shaune Pollack, Dr Harley Alto Corliss Skains

## 2012-08-05 NOTE — Progress Notes (Signed)
Alexis Braun 05-Aug-1950 MRN [id number]   History of Present Illness:  This is a 62 year old white female who is here to discuss a laparoscopic cholecystectomy. She had a recent upper abdominal ultrasound on 07/15/2012 which showed 8 mm polyps with irregular margins. The gallbladder wall itself was normal and the common bile duct was 3 mm. The liver was borderline enlarged but her splenic size was normal at 6.2 cm. A previous ultrasound in March 2013 showed a 6 mm gallbladder polyp versus non-shadowing stone. She has a history of hepatitis C as a teenager but her hepatitis C RNA by PCR is nondetectable. Her liver function tests and sprue profile were both negative. She has a lot of dyspepsia. She is on a strict low-fat diet to control her symptoms. An upper endoscopy in March 2013 showed 2 cm hiatal hernia and grade 1 esophagitis. She was dilated to 48 [place] dilator. She does not take any acid reducing agents other than Gaviscon. She had a normal colonoscopy in March 2013. She would like to undergo a cholecystectomy. There is no family history of gallbladder disease but she feels that her symptoms are most likely related to her gallbladder.   Past Medical History  Diagnosis Date  . Chest pain   . Migraines   . Ovarian cyst 04/03/1999  . Positive hepatitis C antibody test     at age 15; has been told that she "self cured" No current or past treatment.  Marland Kitchen GERD (gastroesophageal reflux disease)   . Allergy     sulfa  . Arthritis   . Hypertension     on medication  . Gallbladder polyp 2014   Past Surgical History  Procedure Laterality Date  . Oophorectomy      Right   . Colonoscopy      2007  . Sigmoidoscopy      1999    reports that she has never smoked. She has never used smokeless tobacco. She reports that  drinks alcohol. She reports that she does not use illicit drugs. family history includes Heart disease in her father and mother.  There is no history of Colon  cancer. Allergies  Allergen Reactions  . Statins     History of Hepatitis C Patient will not take them  . Sulfa Antibiotics Other (See Comments)    Pt. Does not remember what happened, just that she had a "reaction"        Review of Systems: Negative for dysphagia. Positive for being overweight  The remainder of the 10 point ROS is negative except as outlined in H&P   Physical Exam: General appearance  Well developed, in no distress. Eyes- non icteric. HEENT nontraumatic, normocephalic. Mouth no lesions, tongue papillated, no cheilosis. Neck supple without adenopathy, thyroid not enlarged, no carotid bruits, no JVD. Lungs Clear to auscultation bilaterally. Cor normal S1, normal S2, regular rhythm, no murmur,  quiet precordium. Abdomen: Soft nontender with normoactive bowel sounds. Minimal discomfort over the bladder. No distention. Liver edge at costal margin. Rectal: Not done. Extremities no pedal edema. Skin no lesions. Neurological alert and oriented x 3. Psychological normal mood and affect.  Assessment and Plan:  Problem #54 62 year old white female with a gallbladder polyp versus adherent stone. There has been an enlargement of the lesion  from 6-8 mm by ultrasound. Ultrasound is not always reliable, however, the patient is quite concerned about the possibility of a stone and she is convinced that her symptoms are likely related to the gallbladder. She  wishes to have it removed laparoscopically. She already has an appointment today with the surgeon to discuss that. As far as her reflux is concerned, she will continue Gaviscon and as necessary ranitidine. We discussed a HIDA scan but I'm not sure of the significance of that because she wants to have laparoscopic cholecystectomy regardless.  Problem #2 Recall colonoscopy will be due in March 2023.   08/05/2012 Alexis Braun

## 2012-08-07 ENCOUNTER — Encounter (INDEPENDENT_AMBULATORY_CARE_PROVIDER_SITE_OTHER): Payer: Self-pay

## 2012-08-07 ENCOUNTER — Ambulatory Visit (INDEPENDENT_AMBULATORY_CARE_PROVIDER_SITE_OTHER): Payer: BC Managed Care – PPO | Admitting: Surgery

## 2012-08-12 ENCOUNTER — Encounter: Payer: Self-pay | Admitting: Neurology

## 2012-08-12 ENCOUNTER — Ambulatory Visit (INDEPENDENT_AMBULATORY_CARE_PROVIDER_SITE_OTHER): Payer: BC Managed Care – PPO | Admitting: Neurology

## 2012-08-12 VITALS — BP 110/68 | HR 56 | Temp 97.7°F | Ht 65.5 in | Wt 188.0 lb

## 2012-08-12 DIAGNOSIS — G43009 Migraine without aura, not intractable, without status migrainosus: Secondary | ICD-10-CM

## 2012-08-12 DIAGNOSIS — G4486 Cervicogenic headache: Secondary | ICD-10-CM

## 2012-08-12 DIAGNOSIS — R51 Headache: Secondary | ICD-10-CM

## 2012-08-12 DIAGNOSIS — G43909 Migraine, unspecified, not intractable, without status migrainosus: Secondary | ICD-10-CM

## 2012-08-12 NOTE — Progress Notes (Signed)
Subjective:    Patient ID: Alexis Braun is a 62 y.o. female.  HPI  Huston Foley, MD, PhD [facility] [address], [address] [address] [place], [place] [address]  Dear Dr. Kevan Ny,   I saw your patient, Alexis Braun, upon your kind request in my neurologic clinic today for initial consultation of her headaches. The patient is unaccompanied today. As you know, Alexis Braun is a very pleasant 62 year old right-handed woman with an underlying medical history of reflux disease, hepatitis C, anxiety and congenital hearing loss who has been having recurrent headaches for the past several years. She has been on atenolol and was prescribed Imitrex in the distant past when she was seen at the [facility]. She was recently started on a trial of Flexeril by you, but she did not pick up the Rx. She's currently on acyclovir, baby aspirin, Benadryl as needed, Estrace, Xanax as needed, Imitrex as needed, atenolol 12.5 mg daily, trazodone as needed.  She has had migraines for years. She reports a right headache, which is described as constant ache, starting in the neck area and radiating to the R posterior head. She was seen in the ER on 05/15/12, d/t HA and dizziness and shock like sensations in her scalp. She improved after medication given in the ER. She had some brief R facial tingling as well, which had resolved before the ER visit. She is no sure if she had take Imitrex before. She has occasional nausea, and vomiting. Imitrex 50 mg once or twice helps her Sx. Her HA frequency is erratic, she may go weeks without an attack. She has noted no aura and has noted triggers, including exercise, dehydration, sleep deprivation.   Her Past Medical History Is Significant For: Past Medical History  Diagnosis Date  . Chest pain   . Migraines   . Ovarian cyst 04/03/1999  . Positive hepatitis C antibody test     at age 70; has been told that she "self cured" No current or past treatment.   Marland Kitchen GERD (gastroesophageal reflux disease)   . Allergy     sulfa  . Arthritis   . Hypertension     on medication  . Gallbladder polyp 2014    Her Past Surgical History Is Significant For: Past Surgical History  Procedure Laterality Date  . Oophorectomy  2002    Right   . Colonoscopy      2007  . Sigmoidoscopy      1999    Her Family History Is Significant For: Family History  Problem Relation Age of Onset  . Colon cancer Neg Hx   . Heart disease Father   . Heart attack Father   . Heart disease Mother   . Dementia Mother   . Migraines Mother     Her Social History Is Significant For: History   Social History  . Marital Status: Single    Spouse Name: N/A    Number of Children: 0  . Years of Education: MA   Occupational History  . Retired    Social History Main Topics  . Smoking status: Never Smoker   . Smokeless tobacco: Never Used  . Alcohol Use: Yes     Comment: occ; twice monthly  . Drug Use: No  . Sexually Active: None   Other Topics Concern  . None   Social History Narrative   Patient lives at home alone.   occ caffeine; twice a week     Her Allergies  Are:  Allergies  Allergen Reactions  . Statins     History of Hepatitis C Patient will not take them  . Sulfa Antibiotics Other (See Comments)    Pt. Does not remember what happened, just that she had a "reaction"  :   Her Current Medications Are:  Outpatient Encounter Prescriptions as of 08/12/2012  Medication Sig Dispense Refill  . ALPRAZolam (XANAX) 0.5 MG tablet Take 0.5 mg by mouth at bedtime as needed.        Marland Kitchen aspirin 81 MG tablet Take 81 mg by mouth daily.        Marland Kitchen atenolol (TENORMIN) 50 MG tablet 1/2 tab po bid      . cyclobenzaprine (FLEXERIL) 10 MG tablet Take 1 tablet by mouth daily.      Marland Kitchen estradiol (ESTRACE) 0.1 MG/GM vaginal cream Place vaginally as directed.       . Flaxseed, Linseed, (FLAX SEEDS) POWD Take 1 tablet by mouth daily. As directed      . lidocaine (LIDODERM) 5 %  Place 1 patch onto the skin daily. Remove & Discard patch within 12 hours or as directed by MD      . Omega-3 Fatty Acids (FISH OIL) 1000 MG CAPS 1 tab po qd       . SUMAtriptan (IMITREX) 100 MG tablet Take 50 mg by mouth as needed.      . traZODone (DESYREL) 50 MG tablet Take 50 mg by mouth as needed.        No facility-administered encounter medications on file as of 08/12/2012.  : Review of Systems  Constitutional: Positive for fatigue.  Neurological: Positive for weakness and headaches.  Psychiatric/Behavioral: Positive for sleep disturbance (sleepiness, not enough sleep).    Objective:  Neurologic Exam  Physical Exam Physical Examination:   Filed Vitals:   08/12/12 0837  BP: 110/68  Pulse: 56  Temp: 97.7 F (36.5 C)    General Examination: The patient is a very pleasant 62 y.o. female in no acute distress. She appears well-developed and well-nourished and adequately groomed.   HEENT: Normocephalic, atraumatic, pupils are equal, round and reactive to light and accommodation. Funduscopic exam is normal with sharp disc margins noted. Extraocular tracking is good without limitation to gaze excursion or nystagmus noted. Normal smooth pursuit is noted. Hearing is grossly intact. Tympanic membranes are clear bilaterally. Face is symmetric with normal facial animation and normal facial sensation. Speech is clear with no dysarthria noted. There is no hypophonia. There is no lip, neck/head, jaw or voice tremor. Neck is supple with full range of passive and active motion. There are no carotid bruits on auscultation. Oropharynx exam reveals: mild mouth dryness, adequate dental hygiene and mild airway crowding. Mallampati is class II. Tongue protrudes centrally and palate elevates symmetrically.   Chest: Clear to auscultation without wheezing, rhonchi or crackles noted.  Heart: S1+S2+0, regular and normal without murmurs, rubs or gallops noted.   Abdomen: Soft, non-tender and non-distended  with normal bowel sounds appreciated on auscultation.  Extremities: There is no pitting edema in the distal lower extremities bilaterally. Pedal pulses are intact.  Skin: Warm and dry without trophic changes noted. There are no varicose veins.  Musculoskeletal: exam reveals no obvious joint deformities, tenderness or joint swelling or erythema.   Neurologically:  Mental status: The patient is awake, alert and oriented in all 4 spheres. Her memory, attention, language and knowledge are appropriate. There is no aphasia, agnosia, apraxia or anomia. Speech is clear with normal prosody  and enunciation. Thought process is linear. Mood is congruent and affect is normal.  Cranial nerves are as described above under HEENT exam. In addition, shoulder shrug is normal with equal shoulder height noted. Motor exam: Normal bulk, strength and tone is noted. There is no drift, tremor or rebound. Romberg is negative. Reflexes are 2+ throughout. Toes are downgoing bilaterally. Fine motor skills are intact with normal finger taps, normal hand movements, normal rapid alternating patting, normal foot taps and normal foot agility.  Cerebellar testing shows no dysmetria or intention tremor on finger to nose testing. Heel to shin is unremarkable bilaterally. There is no truncal or gait ataxia.  Sensory exam is intact to light touch, pinprick, vibration, temperature sense and proprioception in the upper and lower extremities.  Gait, station and balance are unremarkable. No veering to one side is noted. No leaning to one side is noted. Posture is age-appropriate and stance is narrow based. No problems turning are noted. She turns en bloc. Tandem walk is unremarkable. Intact toe and heel stance is noted.               Assessment and Plan:   In summary, Alexis Braun is a very pleasant 62 y.o.-year old female with a history of migraines without aura, as well as cervicogenic HAs. Her physical exam is stable and non-focal. She  is doing fairly well at this time and I reassured the patient in that regard.  I had a long chat with the patient about my findings and the diagnosis of migraine and neck related pain. We talked about medical treatments and non-pharmacological approaches, as well as HA triggers. We talked about maintaining a healthy lifestyle in general. I encouraged the patient to eat healthy, exercise daily and keep well hydrated, to keep a scheduled bedtime and wake time routine, to not skip any meals and eat healthy snacks in between meals and to have protein with every meal.   As far as further diagnostic testing is concerned, I suggested the following today: MRI brain and neck wo contrast.  As far as medications are concerned, I recommended the following at this time: no change, in that she can go ahead and fill her Rx for Flexeril which are provided. She is advised that this medication can be quite sedating and she is advised not to drive after taking this until she knows how it affects her. She may be able to take this only at night for neck muscle relaxation. She is furthermore offered a prescription for Phenergan for nausea but she declined this. She can continue with Imitrex 50 mg as needed as prescribed by you. Down the road if her headache frequency or severity increases we may have to talk about a preventative or daily medication which I believe she does not need at this moment. I can see her back on an as-needed basis and we will call her with the test results as they come in. She was in agreement. I answered all her questions today and can see her back on an as-needed basis.   Thank you very much for allowing me to participate in the care of this nice patient. If I can be of any further assistance to you please do not hesitate to call me at [phone].  Sincerely,   Huston Foley, MD, PhD

## 2012-08-12 NOTE — Patient Instructions (Addendum)
I think overall you are doing fairly well but I do want to suggest a few things today:  As far as your medications are concerned, I would like to suggest    As far as diagnostic testing: MRI brain and neck. We will call you for your results.   I can see you back as needed. Please remember, common headache triggers are: sleep deprivation, dehydration, overheating, stress, hypoglycemia or skipping meals, excessive pain medications or excessive alcohol use. Some people have food triggers such as aged cheese, orange juice or chocolate, especially dark chocolate. Try to avoid these headache triggers as much possible. It may be helpful to keep a headache diary to figure out what makes your headaches worse or brings them on. Some people report headache onset after exercise but studies have shown that regular exercise may actually prevent headaches from coming on. If you have exercise-induced headaches, please make sure that you drink plenty of fluid before and after exercising and that you don't over do it.  Our phone number is [phone]. We also have an after hours call service for urgent matters and there is a physician on-call for urgent questions. For any emergencies you know to call 911 or go to the nearest emergency room.

## 2012-08-21 ENCOUNTER — Ambulatory Visit (INDEPENDENT_AMBULATORY_CARE_PROVIDER_SITE_OTHER): Payer: BC Managed Care – PPO

## 2012-08-21 DIAGNOSIS — R51 Headache: Secondary | ICD-10-CM

## 2012-08-21 DIAGNOSIS — G4486 Cervicogenic headache: Secondary | ICD-10-CM

## 2012-08-21 DIAGNOSIS — G43009 Migraine without aura, not intractable, without status migrainosus: Secondary | ICD-10-CM

## 2012-08-22 NOTE — Progress Notes (Signed)
Quick Note:  Please call and advise the patient that the recent brain MRI wo contrast was within normal limits. In particular, there were no acute findings, such as a stroke, or mass or blood products. No further action is required on this test at this time. Please remind patient to keep any upcoming appointments or tests and to call us with any interim questions, concerns, problems or updates. Thanks,  Huston Foley, MD, PhD   ______

## 2012-08-25 NOTE — Progress Notes (Signed)
Quick Note:  Spoke to patient and relayed normal MRI brain results, per Dr. Harlin Rain. ______

## 2012-09-19 ENCOUNTER — Ambulatory Visit (INDEPENDENT_AMBULATORY_CARE_PROVIDER_SITE_OTHER): Payer: BC Managed Care – PPO | Admitting: Surgery

## 2012-09-30 ENCOUNTER — Encounter (INDEPENDENT_AMBULATORY_CARE_PROVIDER_SITE_OTHER): Payer: Self-pay | Admitting: Surgery

## 2012-09-30 ENCOUNTER — Ambulatory Visit (INDEPENDENT_AMBULATORY_CARE_PROVIDER_SITE_OTHER): Payer: BC Managed Care – PPO | Admitting: Surgery

## 2012-09-30 VITALS — BP 136/82 | HR 78 | Temp 99.1°F | Resp 14 | Ht 65.0 in | Wt 186.4 lb

## 2012-09-30 DIAGNOSIS — K824 Cholesterolosis of gallbladder: Secondary | ICD-10-CM

## 2012-09-30 NOTE — Progress Notes (Signed)
Patient ID: Alexis Braun, female   DOB: 1950-06-01, 62 y.o.   MRN: [id number]  Chief Complaint  Patient presents with  . New Evaluation    eval GB polyp    HPI Alexis Braun is a 62 y.o. female.  Referred by Dr. Juanda Chance for an enlarging gallbladder polyp  HPI This is a 62 year old female who presents with a one half year history of persistent nausea, bloating, with some postprandial exacerbation. She denies any significant abdominal pain. She had an ultrasound in 2013 that showed a 6 mm gallbladder polyp. She had a repeat ultrasound this year that showed that this polyp has enlarged slightly. No sign of cholecystitis. Liver function tests are normal. Due to the enlargement of the polyp, and her persistent symptoms, she is referred for evaluation for cholecystectomy. Past Medical History  Diagnosis Date  . Chest pain   . Migraines   . Ovarian cyst 04/03/1999  . Positive hepatitis C antibody test     at age 53; has been told that she "self cured" No current or past treatment.  Marland Kitchen GERD (gastroesophageal reflux disease)   . Allergy     sulfa  . Arthritis   . Hypertension     on medication  . Gallbladder polyp 2014    Past Surgical History  Procedure Laterality Date  . Oophorectomy  2002    Right   . Colonoscopy      2007  . Sigmoidoscopy      1999    Family History  Problem Relation Age of Onset  . Colon cancer Neg Hx   . Heart disease Father   . Heart attack Father   . Heart disease Mother   . Dementia Mother   . Migraines Mother     Social History History  Substance Use Topics  . Smoking status: Former Smoker    Quit date: 01/15/1970  . Smokeless tobacco: Never Used  . Alcohol Use: Yes     Comment: occ; twice monthly    Allergies  Allergen Reactions  . Statins     History of Hepatitis C Patient will not take them  . Sulfa Antibiotics Other (See Comments)    Pt. Does not remember what happened, just that she had a "reaction"    Current Outpatient  Prescriptions  Medication Sig Dispense Refill  . ALPRAZolam (XANAX) 0.5 MG tablet Take 0.5 mg by mouth at bedtime as needed.        Marland Kitchen aspirin 81 MG tablet Take 81 mg by mouth daily.        Marland Kitchen atenolol (TENORMIN) 50 MG tablet 1/2 tab po bid      . cyclobenzaprine (FLEXERIL) 10 MG tablet Take 1 tablet by mouth daily.      Marland Kitchen estradiol (ESTRACE) 0.1 MG/GM vaginal cream Place vaginally as directed.       . Flaxseed, Linseed, (FLAX SEEDS) POWD Take 1 tablet by mouth daily. As directed      . ibuprofen (ADVIL,MOTRIN) 200 MG tablet Take 200 mg by mouth every 6 (six) hours as needed for pain.      . Omega-3 Fatty Acids (FISH OIL) 1000 MG CAPS 1 tab po qd       . SUMAtriptan (IMITREX) 100 MG tablet Take 50 mg by mouth as needed.      . traZODone (DESYREL) 50 MG tablet Take 50 mg by mouth as needed.       . lidocaine (LIDODERM) 5 % Place 1 patch onto the skin  daily. Remove & Discard patch within 12 hours or as directed by MD       No current facility-administered medications for this visit.    Review of Systems Review of Systems  Constitutional: Negative for fever, chills and unexpected weight change.  HENT: Negative for hearing loss, congestion, sore throat, trouble swallowing and voice change.   Eyes: Negative for visual disturbance.  Respiratory: Negative for cough and wheezing.   Cardiovascular: Negative for chest pain, palpitations and leg swelling.  Gastrointestinal: Positive for nausea and abdominal distention. Negative for vomiting, abdominal pain, diarrhea, constipation, blood in stool and anal bleeding.  Genitourinary: Negative for hematuria, vaginal bleeding and difficulty urinating.  Musculoskeletal: Negative for arthralgias.  Skin: Negative for rash and wound.  Neurological: Negative for seizures, syncope and headaches.  Hematological: Negative for adenopathy. Does not bruise/bleed easily.  Psychiatric/Behavioral: Negative for confusion.    Blood pressure 136/82, pulse 78,  temperature 99.1 F (37.3 C), temperature source Temporal, resp. rate 14, height 5\' 5"  (1.651 m), weight 186 lb 6.4 oz (84.55 kg).  Physical Exam Physical Exam WDWN in NAD HEENT:  EOMI, sclera anicteric Neck:  No masses, no thyromegaly Lungs:  CTA bilaterally; normal respiratory effort CV:  Regular rate and rhythm; no murmurs Abd:  +bowel sounds, soft, no masses; mildly tender in lower abdomen Ext:  Well-perfused; no edema Skin:  Warm, dry; no sign of jaundice  Data Reviewed Lab Results  Component Value Date   ALT 21 08/05/2012   AST 21 08/05/2012   ALKPHOS 76 08/05/2012   BILITOT 0.4 08/05/2012   RADIOLOGY REPORT*  Clinical Data: Follow up gallbladder polyp  COMPLETE ABDOMINAL ULTRASOUND  Comparison: 01/24/2012  Findings:  Gallbladder: Suspected 8 mm gallbladder polyp, previously 6 mm,  with mildly irregular margins. No definite focal color Doppler  flow. No gallstones, gallbladder wall thickening, or  pericholecystic fluid. Negative sonographic Murphy's sign.  Common bile duct: Measures 3 mm.  Liver: No focal lesion identified. At the upper limits of normal  for parenchymal echogenicity.  IVC: Appears normal.  Pancreas: Poorly visualized due to overlying bowel gas.  Spleen: Measures 6.2 cm.  Right Kidney: Measures 10.6 cm. No mass or hydronephrosis.  Left Kidney: Measures 11.3 cm. No mass hydronephrosis.  Abdominal aorta: No aneurysm identified.  IMPRESSION:  Suspected 8 mm gallbladder polyp, as described above, mildly  increased.  Given interval increase in size, 14-month follow-up ultrasound is  suggested.  Original Report Authenticated By: Charline Bills, M.D.   Assessment    Enlarging gallbladder polyp - mild symptoms.     Plan    Laparoscopic cholecystectomy with intraoperative cholangiogram. The surgical procedure has been discussed with the patient.  Potential risks, benefits, alternative treatments, and expected outcomes have been explained.  All of the  patient's questions at this time have been answered.  The likelihood of reaching the patient's treatment goal is good.  The patient understand the proposed surgical procedure and wishes to proceed.         Brycin Kille K. 09/30/2012, 10:04 AM

## 2012-10-01 ENCOUNTER — Encounter (HOSPITAL_COMMUNITY): Payer: Self-pay | Admitting: Pharmacy Technician

## 2012-10-06 NOTE — Pre-Procedure Instructions (Signed)
Alexis Braun  10/06/2012   Your procedure is scheduled on: Wednesday, Sept. 24th    Report to [facility]  2 * 3 at 29 Noon   Call this number if you have problems the morning of surgery: [phone]   Remember:   Do not eat food or drink liquids after midnight Tuesday.   Take these medicines the morning of surgery with A SIP OF WATER: Xanax, Atenolol, Zantac   Do not wear jewelry, make-up or nail polish.  Do not wear lotions, powders, or perfumes. You may NOT wear deodorant.   Do not shave underarms & legs 48 hours prior to surgery.   Do not bring valuables to the hospital.  [facility] is not responsible for any belongings or valuables.   Contacts, dentures or bridgework may not be worn into surgery.  Leave suitcase in the car. After surgery it may be brought to your room.  For patients admitted to the hospital, checkout time is 11:00 AM the day of discharge.   Name and phone number of your driver:     Special Instructions: Shower using CHG 2 nights before surgery and the night before surgery.  If you shower the day of surgery use CHG.  Use special wash - you have one bottle of CHG for all showers.  You should use approximately 1/3 of the bottle for each shower.   Please read over the following fact sheets that you were given: Pain Booklet and Surgical Site Infection Prevention

## 2012-10-07 ENCOUNTER — Encounter (HOSPITAL_COMMUNITY): Payer: Self-pay

## 2012-10-07 ENCOUNTER — Encounter (HOSPITAL_COMMUNITY)
Admission: RE | Admit: 2012-10-07 | Discharge: 2012-10-07 | Disposition: A | Discharge status: Home / Self Care | Payer: BC Managed Care – PPO | Source: Ambulatory Visit | Attending: Surgery | Admitting: Surgery

## 2012-10-07 ENCOUNTER — Encounter (HOSPITAL_COMMUNITY)
Admission: RE | Admit: 2012-10-07 | Discharge: 2012-10-07 | Disposition: A | Discharge status: Home / Self Care | Payer: BC Managed Care – PPO | Source: Ambulatory Visit | Attending: Anesthesiology | Admitting: Anesthesiology

## 2012-10-07 HISTORY — DX: Anxiety disorder, unspecified: F41.9

## 2012-10-07 LAB — COMPREHENSIVE METABOLIC PANEL
Albumin: 4.1 g/dL (ref 3.5–5.2)
BUN: 17 mg/dL (ref 6–23)
CO2: 26 mEq/L (ref 19–32)
Calcium: 10 mg/dL (ref 8.4–10.5)
Chloride: 102 mEq/L (ref 96–112)
Creatinine, Ser: 0.84 mg/dL (ref 0.50–1.10)
GFR calc Af Amer: 86 mL/min — ABNORMAL LOW (ref >90)
GFR calc non Af Amer: 74 mL/min — ABNORMAL LOW (ref >90)
Total Bilirubin: 0.3 mg/dL (ref 0.3–1.2)
Total Protein: 7.1 g/dL (ref 6.0–8.3)

## 2012-10-07 LAB — CBC
HCT: 38.8 % (ref 36.0–46.0)
Hemoglobin: 14 g/dL (ref 12.0–15.0)
MCHC: 36.1 g/dL — ABNORMAL HIGH (ref 30.0–36.0)
Platelets: 294 10*3/uL (ref 150–400)
RBC: 4.5 MIL/uL (ref 3.87–5.11)
WBC: 5.4 10*3/uL (ref 4.0–10.5)

## 2012-10-07 MED ORDER — CEFAZOLIN SODIUM-DEXTROSE 2-3 GM-% IV SOLR
2.0000 g | INTRAVENOUS | Status: AC
  Administered 2012-10-08: 2 g via INTRAVENOUS
  Filled 2012-10-07: qty 50

## 2012-10-07 MED ORDER — CHLORHEXIDINE GLUCONATE 4 % EX LIQD: 1.0000 "application " | Freq: Once | CUTANEOUS | Status: DC

## 2012-10-07 NOTE — Progress Notes (Signed)
Primary physician - Dr. Shaune Pollack Has seen Dr. Eden Emms in past but was told to only see on as needed basis approx. 3 years ago. ekg in may 2014 no other cardiac testing recently. Stress test several years ago at [facility]

## 2012-10-08 ENCOUNTER — Encounter (HOSPITAL_COMMUNITY)
Admission: RE | Disposition: A | Discharge status: Home / Self Care | Payer: Self-pay | Source: Ambulatory Visit | Attending: Surgery

## 2012-10-08 ENCOUNTER — Ambulatory Visit (HOSPITAL_COMMUNITY)
Admission: RE | Admit: 2012-10-08 | Discharge: 2012-10-08 | Disposition: A | Discharge status: Home / Self Care | Payer: BC Managed Care – PPO | Source: Ambulatory Visit | Attending: Surgery | Admitting: Surgery

## 2012-10-08 ENCOUNTER — Ambulatory Visit (HOSPITAL_COMMUNITY): Payer: BC Managed Care – PPO

## 2012-10-08 ENCOUNTER — Ambulatory Visit (HOSPITAL_COMMUNITY): Payer: BC Managed Care – PPO | Admitting: Anesthesiology

## 2012-10-08 ENCOUNTER — Encounter (HOSPITAL_COMMUNITY): Payer: Self-pay | Admitting: Anesthesiology

## 2012-10-08 DIAGNOSIS — K824 Cholesterolosis of gallbladder: Secondary | ICD-10-CM

## 2012-10-08 DIAGNOSIS — K810 Acute cholecystitis: Secondary | ICD-10-CM

## 2012-10-08 DIAGNOSIS — K819 Cholecystitis, unspecified: Secondary | ICD-10-CM | POA: Insufficient documentation

## 2012-10-08 DIAGNOSIS — I10 Essential (primary) hypertension: Secondary | ICD-10-CM | POA: Insufficient documentation

## 2012-10-08 HISTORY — PX: CHOLECYSTECTOMY: SHX55

## 2012-10-08 SURGERY — LAPAROSCOPIC CHOLECYSTECTOMY WITH INTRAOPERATIVE CHOLANGIOGRAM
Anesthesia: General | Site: Abdomen | Wound class: Contaminated

## 2012-10-08 SURGERY — LAPAROSCOPIC CHOLECYSTECTOMY WITH INTRAOPERATIVE CHOLANGIOGRAM
Anesthesia: General

## 2012-10-08 MED ORDER — BUPIVACAINE-EPINEPHRINE 0.25% -1:200000 IJ SOLN
INTRAMUSCULAR | Status: DC | PRN
  Administered 2012-10-08: 10 mL

## 2012-10-08 MED ORDER — PROPOFOL 10 MG/ML IV BOLUS
INTRAVENOUS | Status: DC | PRN
  Administered 2012-10-08: 200 mg via INTRAVENOUS

## 2012-10-08 MED ORDER — ONDANSETRON HCL 4 MG/2ML IJ SOLN: 4.0000 mg | INTRAMUSCULAR | Status: DC | PRN

## 2012-10-08 MED ORDER — EPHEDRINE SULFATE 50 MG/ML IJ SOLN
INTRAMUSCULAR | Status: DC | PRN
  Administered 2012-10-08: 10 mg via INTRAVENOUS

## 2012-10-08 MED ORDER — MIDAZOLAM HCL 5 MG/5ML IJ SOLN
INTRAMUSCULAR | Status: DC | PRN
  Administered 2012-10-08: 2 mg via INTRAVENOUS

## 2012-10-08 MED ORDER — MORPHINE SULFATE 2 MG/ML IJ SOLN: 2.0000 mg | INTRAMUSCULAR | Status: DC | PRN

## 2012-10-08 MED ORDER — LACTATED RINGERS IV SOLN
INTRAVENOUS | Status: DC
  Administered 2012-10-08 (×2): via INTRAVENOUS

## 2012-10-08 MED ORDER — FENTANYL CITRATE 0.05 MG/ML IJ SOLN
INTRAMUSCULAR | Status: AC
  Administered 2012-10-08: 50 ug via INTRAVENOUS
  Filled 2012-10-08: qty 2

## 2012-10-08 MED ORDER — BUPIVACAINE-EPINEPHRINE PF 0.25-1:200000 % IJ SOLN
INTRAMUSCULAR | Status: AC
  Filled 2012-10-08: qty 30

## 2012-10-08 MED ORDER — SODIUM CHLORIDE 0.9 % IR SOLN
Status: DC | PRN
  Administered 2012-10-08: 1

## 2012-10-08 MED ORDER — PROMETHAZINE HCL 12.5 MG PO TABS: 12.5000 mg | ORAL_TABLET | Freq: Four times a day (QID) | ORAL | Status: DC | PRN

## 2012-10-08 MED ORDER — SODIUM CHLORIDE 0.9 % IV SOLN
INTRAVENOUS | Status: DC | PRN
  Administered 2012-10-08: 16:00:00

## 2012-10-08 MED ORDER — ONDANSETRON HCL 4 MG/2ML IJ SOLN
4.0000 mg | Freq: Once | INTRAMUSCULAR | Status: DC | PRN
  Filled 2012-10-08: qty 2

## 2012-10-08 MED ORDER — HYDROMORPHONE HCL PF 1 MG/ML IJ SOLN
0.2500 mg | INTRAMUSCULAR | Status: DC | PRN
  Administered 2012-10-08 (×2): 0.5 mg via INTRAVENOUS

## 2012-10-08 MED ORDER — OXYCODONE-ACETAMINOPHEN 5-325 MG PO TABS
ORAL_TABLET | ORAL | Status: AC
  Filled 2012-10-08: qty 1

## 2012-10-08 MED ORDER — NEOSTIGMINE METHYLSULFATE 1 MG/ML IJ SOLN
INTRAMUSCULAR | Status: DC | PRN
  Administered 2012-10-08: 3 mg via INTRAVENOUS

## 2012-10-08 MED ORDER — OXYCODONE-ACETAMINOPHEN 5-325 MG PO TABS: 1.0000 | ORAL_TABLET | ORAL | Status: DC | PRN

## 2012-10-08 MED ORDER — MORPHINE SULFATE 10 MG/ML IJ SOLN
INTRAMUSCULAR | Status: DC | PRN
  Administered 2012-10-08 (×2): 5 mg via INTRAVENOUS

## 2012-10-08 MED ORDER — DEXAMETHASONE SODIUM PHOSPHATE 4 MG/ML IJ SOLN
INTRAMUSCULAR | Status: DC | PRN
  Administered 2012-10-08: 4 mg via INTRAVENOUS

## 2012-10-08 MED ORDER — OXYCODONE-ACETAMINOPHEN 5-325 MG PO TABS
1.0000 | ORAL_TABLET | ORAL | Status: DC | PRN
  Administered 2012-10-08: 1 via ORAL

## 2012-10-08 MED ORDER — HEMOSTATIC AGENTS (NO CHARGE) OPTIME
TOPICAL | Status: DC | PRN
  Administered 2012-10-08: 1 via TOPICAL

## 2012-10-08 MED ORDER — LIDOCAINE HCL (CARDIAC) 20 MG/ML IV SOLN
INTRAVENOUS | Status: DC | PRN
  Administered 2012-10-08: 100 mg via INTRAVENOUS

## 2012-10-08 MED ORDER — FENTANYL CITRATE 0.05 MG/ML IJ SOLN
50.0000 ug | INTRAMUSCULAR | Status: DC | PRN
  Administered 2012-10-08 (×2): 50 ug via INTRAVENOUS

## 2012-10-08 MED ORDER — ONDANSETRON HCL 4 MG/2ML IJ SOLN
INTRAMUSCULAR | Status: DC | PRN
  Administered 2012-10-08: 4 mg via INTRAVENOUS

## 2012-10-08 MED ORDER — SUFENTANIL CITRATE 50 MCG/ML IV SOLN
INTRAVENOUS | Status: DC | PRN
  Administered 2012-10-08 (×3): 10 ug via INTRAVENOUS
  Administered 2012-10-08: 20 ug via INTRAVENOUS

## 2012-10-08 MED ORDER — ROCURONIUM BROMIDE 100 MG/10ML IV SOLN
INTRAVENOUS | Status: DC | PRN
  Administered 2012-10-08: 30 mg via INTRAVENOUS

## 2012-10-08 MED ORDER — GLYCOPYRROLATE 0.2 MG/ML IJ SOLN
INTRAMUSCULAR | Status: DC | PRN
  Administered 2012-10-08: 0.4 mg via INTRAVENOUS

## 2012-10-08 MED ORDER — HYDROMORPHONE HCL PF 1 MG/ML IJ SOLN
INTRAMUSCULAR | Status: AC
  Filled 2012-10-08: qty 1

## 2012-10-08 SURGICAL SUPPLY — 44 items
APPLIER CLIP ROT 10 11.4 M/L (STAPLE) ×2
BENZOIN TINCTURE PRP APPL 2/3 (GAUZE/BANDAGES/DRESSINGS) ×2 IMPLANT
BLADE SURG ROTATE 9660 (MISCELLANEOUS) IMPLANT
CANISTER SUCTION 2500CC (MISCELLANEOUS) ×2 IMPLANT
CHLORAPREP W/TINT 26ML (MISCELLANEOUS) ×2 IMPLANT
CLIP APPLIE ROT 10 11.4 M/L (STAPLE) ×1 IMPLANT
CLOTH BEACON ORANGE TIMEOUT ST (SAFETY) ×2 IMPLANT
COVER MAYO STAND STRL (DRAPES) ×2 IMPLANT
COVER SURGICAL LIGHT HANDLE (MISCELLANEOUS) ×2 IMPLANT
DECANTER SPIKE VIAL GLASS SM (MISCELLANEOUS) ×4 IMPLANT
DRAPE C-ARM 42X72 X-RAY (DRAPES) ×2 IMPLANT
DRAPE UTILITY 15X26 W/TAPE STR (DRAPE) ×4 IMPLANT
DRSG TEGADERM 2-3/8X2-3/4 SM (GAUZE/BANDAGES/DRESSINGS) ×6 IMPLANT
DRSG TEGADERM 4X4.75 (GAUZE/BANDAGES/DRESSINGS) ×2 IMPLANT
ELECT REM PT RETURN 9FT ADLT (ELECTROSURGICAL) ×2
ELECTRODE REM PT RTRN 9FT ADLT (ELECTROSURGICAL) ×1 IMPLANT
FILTER SMOKE EVAC LAPAROSHD (FILTER) ×2 IMPLANT
GAUZE SPONGE 2X2 8PLY STRL LF (GAUZE/BANDAGES/DRESSINGS) ×1 IMPLANT
GLOVE BIO SURGEON STRL SZ7 (GLOVE) ×2 IMPLANT
GLOVE BIO SURGEON STRL SZ7.5 (GLOVE) ×2 IMPLANT
GLOVE BIOGEL PI IND STRL 6.5 (GLOVE) ×1 IMPLANT
GLOVE BIOGEL PI IND STRL 7.5 (GLOVE) ×2 IMPLANT
GLOVE BIOGEL PI INDICATOR 6.5 (GLOVE) ×1
GLOVE BIOGEL PI INDICATOR 7.5 (GLOVE) ×2
GOWN STRL NON-REIN LRG LVL3 (GOWN DISPOSABLE) ×8 IMPLANT
HEMOSTAT SNOW SURGICEL 2X4 (HEMOSTASIS) ×2 IMPLANT
KIT BASIN OR (CUSTOM PROCEDURE TRAY) ×2 IMPLANT
KIT ROOM TURNOVER OR (KITS) ×2 IMPLANT
NS IRRIG 1000ML POUR BTL (IV SOLUTION) ×2 IMPLANT
PAD ARMBOARD 7.5X6 YLW CONV (MISCELLANEOUS) ×2 IMPLANT
POUCH SPECIMEN RETRIEVAL 10MM (ENDOMECHANICALS) ×2 IMPLANT
SCISSORS LAP 5X35 DISP (ENDOMECHANICALS) ×2 IMPLANT
SET CHOLANGIOGRAPH 5 50 .035 (SET/KITS/TRAYS/PACK) ×2 IMPLANT
SET IRRIG TUBING LAPAROSCOPIC (IRRIGATION / IRRIGATOR) ×2 IMPLANT
SLEEVE ENDOPATH XCEL 5M (ENDOMECHANICALS) ×2 IMPLANT
SPECIMEN JAR SMALL (MISCELLANEOUS) ×2 IMPLANT
SPONGE GAUZE 2X2 STER 10/PKG (GAUZE/BANDAGES/DRESSINGS) ×1
SUT MNCRL AB 4-0 PS2 18 (SUTURE) ×2 IMPLANT
TOWEL OR 17X24 6PK STRL BLUE (TOWEL DISPOSABLE) ×2 IMPLANT
TOWEL OR 17X26 10 PK STRL BLUE (TOWEL DISPOSABLE) ×2 IMPLANT
TRAY LAPAROSCOPIC (CUSTOM PROCEDURE TRAY) ×2 IMPLANT
TROCAR XCEL BLUNT TIP 100MML (ENDOMECHANICALS) ×2 IMPLANT
TROCAR XCEL NON-BLD 11X100MML (ENDOMECHANICALS) ×2 IMPLANT
TROCAR XCEL NON-BLD 5MMX100MML (ENDOMECHANICALS) ×2 IMPLANT

## 2012-10-08 NOTE — Op Note (Signed)
Laparoscopic Cholecystectomy with IOC Procedure Note  Indications: This patient presents with symptomatic gallbladder disease and will undergo laparoscopic cholecystectomy.  Pre-operative Diagnosis: Gallbladder polyp  Post-operative Diagnosis: Same  Surgeon: Nakiya Rallis K.   Assistants: none  Anesthesia: General endotracheal anesthesia  ASA Class: 2  Procedure Details  The patient was seen again in the Holding Room. The risks, benefits, complications, treatment options, and expected outcomes were discussed with the patient. The possibilities of reaction to medication, pulmonary aspiration, perforation of viscus, bleeding, recurrent infection, finding a normal gallbladder, the need for additional procedures, failure to diagnose a condition, the possible need to convert to an open procedure, and creating a complication requiring transfusion or operation were discussed with the patient. The likelihood of improving the patient's symptoms with return to their baseline status is good.  The patient and/or family concurred with the proposed plan, giving informed consent. The site of surgery properly noted. The patient was taken to Operating Room, identified as Alexis Braun and the procedure verified as Laparoscopic Cholecystectomy with Intraoperative Cholangiogram. A Time Out was held and the above information confirmed.  Prior to the induction of general anesthesia, antibiotic prophylaxis was administered. General endotracheal anesthesia was then administered and tolerated well. After the induction, the abdomen was prepped with Chloraprep and draped in the sterile fashion. The patient was positioned in the supine position.  Local anesthetic agent was injected into the skin near the umbilicus and an incision made. We dissected down to the abdominal fascia with blunt dissection.  The fascia was incised vertically and we entered the peritoneal cavity bluntly.  A pursestring suture of 0-Vicryl was  placed around the fascial opening.  The Hasson cannula was inserted and secured with the stay suture.  Pneumoperitoneum was then created with CO2 and tolerated well without any adverse changes in the patient's vital signs. I inserted the laparoscope and encountered a large amount of omental adhesions around the umbilicus.  We were able to visualize the epigastrium.  An 11-mm port was placed in the subxiphoid position.  Two 5-mm ports were placed in the right upper quadrant. All skin incisions were infiltrated with a local anesthetic agent before making the incision and placing the trocars.  The scope was moved to the epigastric port site. I used cautery scissors to take the omental adhesions around the umbilicus.  We positioned the patient in reverse Trendelenburg, tilted slightly to the patient's left.  We moved the scope back to the umbilicus.  The gallbladder was identified, the fundus grasped and retracted cephalad. Adhesions were lysed bluntly and with the electrocautery where indicated, taking care not to injure any adjacent organs or viscus. The infundibulum was grasped and retracted laterally, exposing the peritoneum overlying the triangle of Calot. This was then divided and exposed in a blunt fashion. A critical view of the cystic duct and cystic artery was obtained.  The cystic duct was clearly identified and bluntly dissected circumferentially. The cystic duct was ligated with a clip distally.   An incision was made in the cystic duct and the [facility] cholangiogram catheter introduced. The catheter was secured using a clip. A cholangiogram was then obtained which showed good visualization of the distal and proximal biliary tree with two very mobile filling defects but no sign of obstruction.  Contrast flowed easily into the duodenum.  I positioned the patient in reverse Trendelenberg, and we repeated the cholangiogram.  The filling defects were gone, which likely represented air bubbles. The catheter was  then removed.  The cystic duct was then ligated with clips and divided. The cystic artery was identified, dissected free, ligated with clips and divided as well.   The gallbladder was dissected from the liver bed in retrograde fashion with the electrocautery. The gallbladder was removed and placed in an Endocatch sac. Some bile was spilled but no stones were noted. The liver bed was irrigated and inspected. Hemostasis was achieved with the electrocautery. Copious irrigation was utilized and was repeatedly aspirated until clear.  The gallbladder and Endocatch sac were then removed through the umbilical port site.  The pursestring suture was used to close the umbilical fascia.    We again inspected the right upper quadrant for hemostasis.  Pneumoperitoneum was released as we removed the trocars.  4-0 Monocryl was used to close the skin.   Benzoin, steri-strips, and clean dressings were applied. The patient was then extubated and brought to the recovery room in stable condition. Instrument, sponge, and needle counts were correct at closure and at the conclusion of the case.   Findings: Cholecystitis without Cholelithiasis  Estimated Blood Loss: Minimal         Drains: none         Specimens: Gallbladder           Complications: None; patient tolerated the procedure well.         Disposition: PACU - hemodynamically stable.         Condition: stable  Wilmon Arms. Corliss Skains, MD, [facility] Surgery  General/ Trauma Surgery  10/08/2012 4:05 PM

## 2012-10-08 NOTE — Transfer of Care (Signed)
Immediate Anesthesia Transfer of Care Note  Patient: Alexis Braun  Procedure(s) Performed: Procedure(s): LAPAROSCOPIC CHOLECYSTECTOMY WITH INTRAOPERATIVE CHOLANGIOGRAM (N/A)  Patient Location: PACU  Anesthesia Type:General  Level of Consciousness: awake, alert  and oriented  Airway & Oxygen Therapy: Patient Spontanous Breathing and Patient connected to nasal cannula oxygen  Post-op Assessment: Report given to PACU RN, Post -op Vital signs reviewed and stable and Patient moving all extremities  Post vital signs: Reviewed and stable  Complications: No apparent anesthesia complications

## 2012-10-08 NOTE — H&P (View-Only) (Signed)
Patient ID: Alexis Braun, female   DOB: 06/10/1950, 62 y.o.   MRN: [id number]  Chief Complaint  Patient presents with  . New Evaluation    eval GB polyp    HPI Alexis Braun is a 62 y.o. female.  Referred by Dr. Brodie for an enlarging gallbladder polyp  HPI This is a 62-year-old female who presents with a one half year history of persistent nausea, bloating, with some postprandial exacerbation. She denies any significant abdominal pain. She had an ultrasound in 2013 that showed a 6 mm gallbladder polyp. She had a repeat ultrasound this year that showed that this polyp has enlarged slightly. No sign of cholecystitis. Liver function tests are normal. Due to the enlargement of the polyp, and her persistent symptoms, she is referred for evaluation for cholecystectomy. Past Medical History  Diagnosis Date  . Chest pain   . Migraines   . Ovarian cyst 04/03/1999  . Positive hepatitis C antibody test     at age 18; has been told that she "self cured" No current or past treatment.  . GERD (gastroesophageal reflux disease)   . Allergy     sulfa  . Arthritis   . Hypertension     on medication  . Gallbladder polyp 2014    Past Surgical History  Procedure Laterality Date  . Oophorectomy  2002    Right   . Colonoscopy      2007  . Sigmoidoscopy      1999    Family History  Problem Relation Age of Onset  . Colon cancer Neg Hx   . Heart disease Father   . Heart attack Father   . Heart disease Mother   . Dementia Mother   . Migraines Mother     Social History History  Substance Use Topics  . Smoking status: Former Smoker    Quit date: 01/15/1970  . Smokeless tobacco: Never Used  . Alcohol Use: Yes     Comment: occ; twice monthly    Allergies  Allergen Reactions  . Statins     History of Hepatitis C Patient will not take them  . Sulfa Antibiotics Other (See Comments)    Pt. Does not remember what happened, just that she had a "reaction"    Current Outpatient  Prescriptions  Medication Sig Dispense Refill  . ALPRAZolam (XANAX) 0.5 MG tablet Take 0.5 mg by mouth at bedtime as needed.        . aspirin 81 MG tablet Take 81 mg by mouth daily.        . atenolol (TENORMIN) 50 MG tablet 1/2 tab po bid      . cyclobenzaprine (FLEXERIL) 10 MG tablet Take 1 tablet by mouth daily.      . estradiol (ESTRACE) 0.1 MG/GM vaginal cream Place vaginally as directed.       . Flaxseed, Linseed, (FLAX SEEDS) POWD Take 1 tablet by mouth daily. As directed      . ibuprofen (ADVIL,MOTRIN) 200 MG tablet Take 200 mg by mouth every 6 (six) hours as needed for pain.      . Omega-3 Fatty Acids (FISH OIL) 1000 MG CAPS 1 tab po qd       . SUMAtriptan (IMITREX) 100 MG tablet Take 50 mg by mouth as needed.      . traZODone (DESYREL) 50 MG tablet Take 50 mg by mouth as needed.       . lidocaine (LIDODERM) 5 % Place 1 patch onto the skin   daily. Remove & Discard patch within 12 hours or as directed by MD       No current facility-administered medications for this visit.    Review of Systems Review of Systems  Constitutional: Negative for fever, chills and unexpected weight change.  HENT: Negative for hearing loss, congestion, sore throat, trouble swallowing and voice change.   Eyes: Negative for visual disturbance.  Respiratory: Negative for cough and wheezing.   Cardiovascular: Negative for chest pain, palpitations and leg swelling.  Gastrointestinal: Positive for nausea and abdominal distention. Negative for vomiting, abdominal pain, diarrhea, constipation, blood in stool and anal bleeding.  Genitourinary: Negative for hematuria, vaginal bleeding and difficulty urinating.  Musculoskeletal: Negative for arthralgias.  Skin: Negative for rash and wound.  Neurological: Negative for seizures, syncope and headaches.  Hematological: Negative for adenopathy. Does not bruise/bleed easily.  Psychiatric/Behavioral: Negative for confusion.    Blood pressure 136/82, pulse 78,  temperature 99.1 F (37.3 C), temperature source Temporal, resp. rate 14, height 5' 5" (1.651 m), weight 186 lb 6.4 oz (84.55 kg).  Physical Exam Physical Exam WDWN in NAD HEENT:  EOMI, sclera anicteric Neck:  No masses, no thyromegaly Lungs:  CTA bilaterally; normal respiratory effort CV:  Regular rate and rhythm; no murmurs Abd:  +bowel sounds, soft, no masses; mildly tender in lower abdomen Ext:  Well-perfused; no edema Skin:  Warm, dry; no sign of jaundice  Data Reviewed Lab Results  Component Value Date   ALT 21 08/05/2012   AST 21 08/05/2012   ALKPHOS 76 08/05/2012   BILITOT 0.4 08/05/2012   RADIOLOGY REPORT*  Clinical Data: Follow up gallbladder polyp  COMPLETE ABDOMINAL ULTRASOUND  Comparison: 01/24/2012  Findings:  Gallbladder: Suspected 8 mm gallbladder polyp, previously 6 mm,  with mildly irregular margins. No definite focal color Doppler  flow. No gallstones, gallbladder wall thickening, or  pericholecystic fluid. Negative sonographic Murphy's sign.  Common bile duct: Measures 3 mm.  Liver: No focal lesion identified. At the upper limits of normal  for parenchymal echogenicity.  IVC: Appears normal.  Pancreas: Poorly visualized due to overlying bowel gas.  Spleen: Measures 6.2 cm.  Right Kidney: Measures 10.6 cm. No mass or hydronephrosis.  Left Kidney: Measures 11.3 cm. No mass hydronephrosis.  Abdominal aorta: No aneurysm identified.  IMPRESSION:  Suspected 8 mm gallbladder polyp, as described above, mildly  increased.  Given interval increase in size, 6-month follow-up ultrasound is  suggested.  Original Report Authenticated By: Sriyesh Krishnan, M.D.   Assessment    Enlarging gallbladder polyp - mild symptoms.     Plan    Laparoscopic cholecystectomy with intraoperative cholangiogram. The surgical procedure has been discussed with the patient.  Potential risks, benefits, alternative treatments, and expected outcomes have been explained.  All of the  patient's questions at this time have been answered.  The likelihood of reaching the patient's treatment goal is good.  The patient understand the proposed surgical procedure and wishes to proceed.         Bryli Mantey K. 09/30/2012, 10:04 AM    

## 2012-10-08 NOTE — Anesthesia Preprocedure Evaluation (Signed)
Anesthesia Evaluation  Patient identified by MRN, date of birth, ID band Patient awake    Reviewed: Allergy & Precautions, H&P , NPO status , Patient's Chart, lab work & pertinent test results  Airway Mallampati: I TM Distance: >3 FB Neck ROM: full    Dental   Pulmonary former smoker,          Cardiovascular hypertension, Rhythm:regular Rate:Normal     Neuro/Psych  Headaches,    GI/Hepatic GERD-  ,(+) Hepatitis -, C  Endo/Other    Renal/GU      Musculoskeletal   Abdominal   Peds  Hematology   Anesthesia Other Findings   Reproductive/Obstetrics                           Anesthesia Physical Anesthesia Plan  ASA: II  Anesthesia Plan: General   Post-op Pain Management:    Induction: Intravenous  Airway Management Planned: Oral ETT  Additional Equipment:   Intra-op Plan:   Post-operative Plan: Extubation in OR  Informed Consent: I have reviewed the patients History and Physical, chart, labs and discussed the procedure including the risks, benefits and alternatives for the proposed anesthesia with the patient or authorized representative who has indicated his/her understanding and acceptance.     Plan Discussed with: CRNA, Anesthesiologist and Surgeon  Anesthesia Plan Comments:         Anesthesia Quick Evaluation

## 2012-10-08 NOTE — Preoperative (Signed)
Beta Blockers   Reason not to administer Beta Blockers:Not Applicable 

## 2012-10-08 NOTE — Interval H&P Note (Signed)
History and Physical Interval Note:  10/08/2012 12:20 PM  Alexis Braun  has presented today for surgery, with the diagnosis of gallbladder polyp  The various methods of treatment have been discussed with the patient and family. After consideration of risks, benefits and other options for treatment, the patient has consented to  Procedure(s): LAPAROSCOPIC CHOLECYSTECTOMY WITH INTRAOPERATIVE CHOLANGIOGRAM (N/A) as a surgical intervention .  The patient's history has been reviewed, patient examined, no change in status, stable for surgery.  I have reviewed the patient's chart and labs.  Questions were answered to the patient's satisfaction.     Marshall Kampf K.

## 2012-10-08 NOTE — Anesthesia Postprocedure Evaluation (Signed)
Anesthesia Post Note  Patient: Alexis Braun  Procedure(s) Performed: Procedure(s) (LRB): LAPAROSCOPIC CHOLECYSTECTOMY WITH INTRAOPERATIVE CHOLANGIOGRAM (N/A)  Anesthesia type: General  Patient location: PACU  Post pain: Pain level controlled and Adequate analgesia  Post assessment: Post-op Vital signs reviewed, Patient's Cardiovascular Status Stable, Respiratory Function Stable, Patent Airway and Pain level controlled  Last Vitals:  Filed Vitals:   10/08/12 1730  BP:   Pulse: 46  Temp:   Resp: 17    Post vital signs: Reviewed and stable  Level of consciousness: awake, alert  and oriented  Complications: No apparent anesthesia complications

## 2012-10-09 ENCOUNTER — Telehealth (INDEPENDENT_AMBULATORY_CARE_PROVIDER_SITE_OTHER): Payer: Self-pay | Admitting: General Surgery

## 2012-10-09 ENCOUNTER — Emergency Department (HOSPITAL_COMMUNITY)
Admission: EM | Admit: 2012-10-09 | Discharge: 2012-10-09 | Disposition: A | Discharge status: Home / Self Care | Payer: BC Managed Care – PPO | Attending: Emergency Medicine | Admitting: Emergency Medicine

## 2012-10-09 ENCOUNTER — Encounter (HOSPITAL_COMMUNITY): Payer: Self-pay | Admitting: Emergency Medicine

## 2012-10-09 DIAGNOSIS — Z888 Allergy status to other drugs, medicaments and biological substances status: Secondary | ICD-10-CM | POA: Insufficient documentation

## 2012-10-09 DIAGNOSIS — Z8742 Personal history of other diseases of the female genital tract: Secondary | ICD-10-CM | POA: Insufficient documentation

## 2012-10-09 DIAGNOSIS — R142 Eructation: Secondary | ICD-10-CM | POA: Insufficient documentation

## 2012-10-09 DIAGNOSIS — R112 Nausea with vomiting, unspecified: Secondary | ICD-10-CM | POA: Insufficient documentation

## 2012-10-09 DIAGNOSIS — Z8719 Personal history of other diseases of the digestive system: Secondary | ICD-10-CM | POA: Insufficient documentation

## 2012-10-09 DIAGNOSIS — K219 Gastro-esophageal reflux disease without esophagitis: Secondary | ICD-10-CM | POA: Insufficient documentation

## 2012-10-09 DIAGNOSIS — R109 Unspecified abdominal pain: Secondary | ICD-10-CM | POA: Insufficient documentation

## 2012-10-09 DIAGNOSIS — R141 Gas pain: Secondary | ICD-10-CM | POA: Insufficient documentation

## 2012-10-09 DIAGNOSIS — Z9889 Other specified postprocedural states: Secondary | ICD-10-CM | POA: Insufficient documentation

## 2012-10-09 DIAGNOSIS — Z4801 Encounter for change or removal of surgical wound dressing: Secondary | ICD-10-CM | POA: Insufficient documentation

## 2012-10-09 DIAGNOSIS — Z7982 Long term (current) use of aspirin: Secondary | ICD-10-CM | POA: Insufficient documentation

## 2012-10-09 DIAGNOSIS — M25519 Pain in unspecified shoulder: Secondary | ICD-10-CM | POA: Insufficient documentation

## 2012-10-09 DIAGNOSIS — F411 Generalized anxiety disorder: Secondary | ICD-10-CM | POA: Insufficient documentation

## 2012-10-09 DIAGNOSIS — M129 Arthropathy, unspecified: Secondary | ICD-10-CM | POA: Insufficient documentation

## 2012-10-09 DIAGNOSIS — Z79899 Other long term (current) drug therapy: Secondary | ICD-10-CM | POA: Insufficient documentation

## 2012-10-09 DIAGNOSIS — Z8669 Personal history of other diseases of the nervous system and sense organs: Secondary | ICD-10-CM | POA: Insufficient documentation

## 2012-10-09 DIAGNOSIS — Z882 Allergy status to sulfonamides status: Secondary | ICD-10-CM | POA: Insufficient documentation

## 2012-10-09 DIAGNOSIS — I10 Essential (primary) hypertension: Secondary | ICD-10-CM | POA: Insufficient documentation

## 2012-10-09 DIAGNOSIS — Z87891 Personal history of nicotine dependence: Secondary | ICD-10-CM | POA: Insufficient documentation

## 2012-10-09 LAB — CBC WITH DIFFERENTIAL/PLATELET
Basophils Absolute: 0 10*3/uL (ref 0.0–0.1)
Eosinophils Relative: 0 % (ref 0–5)
HCT: 36.6 % (ref 36.0–46.0)
Hemoglobin: 13.3 g/dL (ref 12.0–15.0)
Lymphocytes Relative: 9 % — ABNORMAL LOW (ref 12–46)
Lymphs Abs: 1.2 10*3/uL (ref 0.7–4.0)
MCHC: 36.3 g/dL — ABNORMAL HIGH (ref 30.0–36.0)
MCV: 85.5 fL (ref 78.0–100.0)
Monocytes Absolute: 1 10*3/uL (ref 0.1–1.0)
Monocytes Relative: 8 % (ref 3–12)
Neutro Abs: 10.8 10*3/uL — ABNORMAL HIGH (ref 1.7–7.7)
Neutrophils Relative %: 83 % — ABNORMAL HIGH (ref 43–77)
RBC: 4.28 MIL/uL (ref 3.87–5.11)

## 2012-10-09 LAB — COMPREHENSIVE METABOLIC PANEL
AST: 42 U/L — ABNORMAL HIGH (ref 0–37)
Albumin: 4.4 g/dL (ref 3.5–5.2)
Alkaline Phosphatase: 81 U/L (ref 39–117)
BUN: 12 mg/dL (ref 6–23)
CO2: 25 mEq/L (ref 19–32)
Chloride: 99 mEq/L (ref 96–112)
Creatinine, Ser: 0.67 mg/dL (ref 0.50–1.10)
GFR calc Af Amer: >90 mL/min (ref >90)
GFR calc non Af Amer: >90 mL/min (ref >90)
Glucose, Bld: 114 mg/dL — ABNORMAL HIGH (ref 70–99)
Potassium: 3.6 mEq/L (ref 3.5–5.1)
Total Bilirubin: 0.4 mg/dL (ref 0.3–1.2)

## 2012-10-09 LAB — AMYLASE: Amylase: 41 U/L (ref 0–105)

## 2012-10-09 LAB — LIPASE, BLOOD: Lipase: 27 U/L (ref 11–59)

## 2012-10-09 MED ORDER — KETOROLAC TROMETHAMINE 30 MG/ML IJ SOLN
30.0000 mg | Freq: Once | INTRAMUSCULAR | Status: AC
  Administered 2012-10-09: 30 mg via INTRAVENOUS
  Filled 2012-10-09: qty 1

## 2012-10-09 MED ORDER — PROMETHAZINE HCL 25 MG/ML IJ SOLN
12.5000 mg | Freq: Once | INTRAMUSCULAR | Status: AC
  Administered 2012-10-09: 12.5 mg via INTRAVENOUS
  Filled 2012-10-09: qty 1

## 2012-10-09 MED ORDER — ONDANSETRON HCL 4 MG PO TABS: 4.0000 mg | ORAL_TABLET | Freq: Four times a day (QID) | ORAL | Status: DC

## 2012-10-09 MED ORDER — ONDANSETRON HCL 4 MG/2ML IJ SOLN
4.0000 mg | Freq: Once | INTRAMUSCULAR | Status: AC
  Administered 2012-10-09: 4 mg via INTRAVENOUS
  Filled 2012-10-09: qty 2

## 2012-10-09 MED ORDER — SODIUM CHLORIDE 0.9 % IV BOLUS (SEPSIS)
1000.0000 mL | Freq: Once | INTRAVENOUS | Status: AC
  Administered 2012-10-09: 1000 mL via INTRAVENOUS

## 2012-10-09 MED ORDER — PROMETHAZINE HCL 25 MG RE SUPP: 25.0000 mg | Freq: Four times a day (QID) | RECTAL | Status: DC | PRN

## 2012-10-09 MED ORDER — MORPHINE SULFATE 4 MG/ML IJ SOLN
4.0000 mg | INTRAMUSCULAR | Status: DC | PRN
  Administered 2012-10-09: 4 mg via INTRAVENOUS
  Filled 2012-10-09: qty 1

## 2012-10-09 NOTE — Telephone Encounter (Signed)
Pt called stating severe PONV.  She is having trouble keeping liquids down.  i recommended a scopolamine patch for now and hold her percocet, given no pharmacies around her are open.  I would like her to call back in the morning.  She may need a antiemetic rx or to be seen in urgent clinic.

## 2012-10-09 NOTE — Telephone Encounter (Signed)
I gave her Phenergan yesterday - it went straight to her pharmacy.  She must not have picked it up.  Let her know that it was prescribed yesterday.

## 2012-10-09 NOTE — ED Notes (Signed)
Pt presents with N/V  X 24 hours since gall bladder removal yesterday. Incision sites clean, dry, intact. Minimal bleeding noted. No S/S infection. Pt reports abdominal cramping.

## 2012-10-09 NOTE — ED Notes (Signed)
States had GB out last  Yesterday and when she got home she vomited and has not stopped

## 2012-10-09 NOTE — Telephone Encounter (Signed)
LMOM for patient on both phones/ home and cell that her Rx for her Phenergan is at her pharmacy [facility] on [address]. It was sented yesterday ( 10-08-2012)  through epic per Dr Corliss Skains

## 2012-10-09 NOTE — ED Notes (Signed)
MD at bedside. 

## 2012-10-09 NOTE — Telephone Encounter (Signed)
Pt called stating she had called the on-call MD with severe nausea and vomiting since getting home from surgery.  She states she cannot even hold down sips of water.  Has developed pain in the RUQ, probably from vomiting.  First, instructed her to be on strict NPO for 3 hours, to allow her vomiting spasms to subside.  Second, upon chart review instructed her to take the Phenergan that was e-scribed last night; pt states she is unaware of that.  There are two pharmacies in her demographics, so will call to locate the Rx.  Pt also states she has developed a migraine.

## 2012-10-09 NOTE — ED Notes (Signed)
Pt requested Ginger Ale and crackers. Asked and was authorized by DR Fayrene Fearing to give pt Po drink and crackers.

## 2012-10-09 NOTE — ED Provider Notes (Addendum)
CSN: [id number]     Arrival date & time 10/09/12  4098 History   First MD Initiated Contact with Patient 10/09/12 1108     Chief Complaint  Patient presents with  . Emesis    HPI  Alexis Braun has a history of a gallbladder polyp. She is having intermittent pain. She underwent laparoscopic cholecystectomy yesterday with Dr.Tsuei.  This was uncomplicated. She went home. She started having some nausea. She called the office either left-sided this morning. Some Phenergan with codeine for her. She states she was too nauseated to uncomfortable and vomiting too much to get it. Her friend drove her to the hospital this morning with continued vomiting it is heme-negative nonbilious emesis. She has some incisional pain. She states yesterday she had some shoulder pain she states they told her what be present from the pneumoperitoneum.  This has resolved. She is having flatus and bowel movements. She is not short of breath. She is not febrile. She did have an intraoperative cholangiogram that did not show biliary obstruction.  Past Medical History  Diagnosis Date  . Chest pain   . Migraines   . Ovarian cyst 04/03/1999  . Positive hepatitis C antibody test     at age 58; has been told that she "self cured" No current or past treatment.  Marland Kitchen GERD (gastroesophageal reflux disease)   . Allergy     sulfa  . Arthritis   . Hypertension     on medication  . Gallbladder polyp 2014  . Anxiety    Past Surgical History  Procedure Laterality Date  . Oophorectomy  2002    Right   . Colonoscopy      2007  . Sigmoidoscopy      1999   Family History  Problem Relation Age of Onset  . Colon cancer Neg Hx   . Heart disease Father   . Heart attack Father   . Heart disease Mother   . Dementia Mother   . Migraines Mother    History  Substance Use Topics  . Smoking status: Former Smoker    Quit date: 01/15/1970  . Smokeless tobacco: Never Used  . Alcohol Use: Yes     Comment: occ; twice monthly   OB History    Grav Para Term Preterm Abortions TAB SAB Ect Mult Living                 Review of Systems  Constitutional: Negative for fever, chills, diaphoresis, appetite change and fatigue.  HENT: Negative for sore throat, mouth sores and trouble swallowing.   Eyes: Negative for visual disturbance.  Respiratory: Negative for cough, chest tightness, shortness of breath and wheezing.   Cardiovascular: Negative for chest pain.  Gastrointestinal: Positive for nausea, vomiting and abdominal pain. Negative for diarrhea and abdominal distention.  Endocrine: Negative for polydipsia, polyphagia and polyuria.  Genitourinary: Negative for dysuria, frequency and hematuria.  Musculoskeletal: Negative for gait problem.  Skin: Positive for wound. Negative for color change, pallor and rash.  Neurological: Negative for dizziness, syncope, light-headedness and headaches.  Hematological: Does not bruise/bleed easily.  Psychiatric/Behavioral: Negative for behavioral problems and confusion.    Allergies  Sulfa antibiotics and Statins  Home Medications   Current Outpatient Rx  Name  Route  Sig  Dispense  Refill  . ALPRAZolam (XANAX) 0.5 MG tablet   Oral   Take 0.5 mg by mouth at bedtime as needed for sleep.         Marland Kitchen aspirin EC 81 MG  tablet   Oral   Take 81 mg by mouth daily.         Marland Kitchen atenolol (TENORMIN) 50 MG tablet   Oral   Take 25 mg by mouth daily.          . cyclobenzaprine (FLEXERIL) 10 MG tablet   Oral   Take 10 mg by mouth daily as needed for muscle spasms.         Marland Kitchen estradiol (ESTRACE) 0.1 MG/GM vaginal cream   Vaginal   Place vaginally as directed.          . Flaxseed, Linseed, (FLAX SEED OIL PO)   Oral   Take 1 capsule by mouth daily.         Marland Kitchen ibuprofen (ADVIL,MOTRIN) 200 MG tablet   Oral   Take 200-400 mg by mouth daily as needed for pain.          . Omega-3 Fatty Acids (FISH OIL) 1000 MG CAPS   Oral   Take 1 capsule by mouth daily.          Marland Kitchen  oxyCODONE-acetaminophen (PERCOCET/ROXICET) 5-325 MG per tablet   Oral   Take 1 tablet by mouth every 4 (four) hours as needed for pain.   40 tablet   0   . promethazine (PHENERGAN) 12.5 MG tablet   Oral   Take 1 tablet (12.5 mg total) by mouth every 6 (six) hours as needed for nausea.   30 tablet   0   . ranitidine (ZANTAC) 150 MG tablet   Oral   Take 150 mg by mouth daily as needed for heartburn.          . SUMAtriptan (IMITREX) 100 MG tablet   Oral   Take 100 mg by mouth daily as needed for migraine. May repeat in 2 hours if headache persists or recurs.         . traZODone (DESYREL) 50 MG tablet   Oral   Take 50 mg by mouth at bedtime as needed for sleep.          Marland Kitchen ondansetron (ZOFRAN) 4 MG tablet   Oral   Take 1 tablet (4 mg total) by mouth every 6 (six) hours.   12 tablet   0   . promethazine (PHENERGAN) 25 MG suppository   Rectal   Place 1 suppository (25 mg total) rectally every 6 (six) hours as needed for nausea.   12 each   0    BP 153/70  Pulse 73  Temp(Src) 98.2 F (36.8 C) (Oral)  Resp 20  SpO2 100% Physical Exam  Constitutional: She is oriented to person, place, and time. She appears well-developed and well-nourished. No distress.  She appears pale and is holding an emesis base.  HENT:  Head: Normocephalic.  Eyes: Conjunctivae are normal. Pupils are equal, round, and reactive to light. No scleral icterus.  Neck: Normal range of motion. Neck supple. No thyromegaly present.  Cardiovascular: Normal rate and regular rhythm.  Exam reveals no gallop and no friction rub.   No murmur heard. Pulmonary/Chest: Effort normal and breath sounds normal. No respiratory distress. She has no wheezes. She has no rales.  Abdominal: Soft. Bowel sounds are normal. She exhibits no distension. There is no tenderness. There is no rebound.  Her multiple abdominal incisions appear intact. The lower most, periumbilical incision that has some blood under the dressing.  Wound itself is intact. Her tenderness is very incisional. She hasn't have diffuse tenderness. She denies shoulder pain  at this time. She has a non-peritoneal abdomen.  Musculoskeletal: Normal range of motion.  Neurological: She is alert and oriented to person, place, and time.  Skin: Skin is warm and dry. No rash noted.  Psychiatric: She has a normal mood and affect. Her behavior is normal.    ED Course  Procedures (including critical care time) Labs Review Labs Reviewed  CBC WITH DIFFERENTIAL - Abnormal; Notable for the following:    WBC 13.0 (*)    MCHC 36.3 (*)    Neutrophils Relative % 83 (*)    Neutro Abs 10.8 (*)    Lymphocytes Relative 9 (*)    All other components within normal limits  COMPREHENSIVE METABOLIC PANEL - Abnormal; Notable for the following:    Glucose, Bld 114 (*)    AST 42 (*)    ALT 43 (*)    All other components within normal limits  LIPASE, BLOOD  AMYLASE  CG4 I-STAT (LACTIC ACID)   Imaging Review Dg Cholangiogram Operative  10/08/2012   CLINICAL DATA:  Cholecystectomy.  EXAM: INTRAOPERATIVE CHOLANGIOGRAM  TECHNIQUE: Cholangiographic images from the C-arm fluoroscopic device were submitted for interpretation post-operatively. Please see the procedural report for the amount of contrast and the fluoroscopy time utilized.  COMPARISON:  None.  FINDINGS: Common bile duct is not dilated. No filling defect identified. Common bile duct is patent to the duodenum. Negative for biliary leak.  IMPRESSION: Negative   Electronically Signed   By: Marlan Palau M.D.   On: 10/08/2012 16:13    MDM   1. Postoperative nausea and vomiting    Placed a call to him and spoke with Dr. Corliss Skains. He returned the call immediately. I described a non-peritoneal abdomen, and  reassuring labs to him. The patient was feeling better after IV fluids, pain medication, and antiemetics. Given prescription for Zofran and Phenergan suppositories. Discharged home. Clear liquids advancing her diet.  Follow with Dr. Corliss Skains  if not improving.  Doubt bile leak but otherwise normal labs and benign exam. No hypoxemia or shortness of breath last doubt pneumonia or pulmonary embolus. Is having flatus, stool and urine output. No ileus.    Roney Marion, MD 10/09/12 1350  Roney Marion, MD 10/09/12 1350  Roney Marion, MD 10/09/12 [phone]

## 2012-10-13 ENCOUNTER — Telehealth (INDEPENDENT_AMBULATORY_CARE_PROVIDER_SITE_OTHER): Payer: Self-pay

## 2012-10-13 NOTE — Telephone Encounter (Signed)
The pt called stating she had lap chole 11-07-2022.  She has had a death in the family.  She has to fly to [place] by herself.  She will drive to [place] and then board a plane.  She will stay for 3 days and has to carry her own bag.  I told her it is pretty close to surgery and she did go back to the ER postop.  There is a concern for if she has another complication while out of town.   She is very sore and is just taking Tylenol.  I told her I will page Dr Corliss Skains and see what he thinks.  I will ask if she can travel and what she can take for her pain.  She is eating ok now and moving her bowels.  I paged Dr Corliss Skains and he feels she is far enough out that it should be ok.  She should have someone carry her bags for her.  She can take Tylenol while driving and then take her pain medicine once she gets to her destination.  I notified the pt.  I advised she should have airline staff help her with her bags when possible and use the carts they offer.

## 2012-10-21 ENCOUNTER — Telehealth (INDEPENDENT_AMBULATORY_CARE_PROVIDER_SITE_OTHER): Payer: Self-pay | Admitting: *Deleted

## 2012-10-21 NOTE — Telephone Encounter (Signed)
She has a follow-up with me next Tuesday.  If she looks fine next week, I will give her a letter then.  In the meantime, she can have massages if she is laying on her back, just not on her abdomen.

## 2012-10-21 NOTE — Telephone Encounter (Signed)
Called and left message for patient to update her with Dr. Fatima Sanger message below.  Explained for patient to give me a call back if she wants a letter written before next Tuesday with the restriction of only laying on her back, otherwise if there are no complications next week Dr. Corliss Skains may be able to release her fully next Tuesday.

## 2012-10-21 NOTE — Telephone Encounter (Signed)
Patient called to ask about getting a letter for release sent to [facility].  Patient reports that she goes for weekly [place] massages however they have told her since she had surgery (Lap chole 10/08/12) they will not do a massage on her for 6 weeks or until they receive a release note from her surgeon.  Explained that I will send a message to Dr. Corliss Skains to ask if he thinks it would be ok to send them a letter to allow her to get her weekly massages.  Patient states understanding and agreeable with plan.

## 2012-10-28 ENCOUNTER — Encounter (INDEPENDENT_AMBULATORY_CARE_PROVIDER_SITE_OTHER): Payer: Self-pay | Admitting: Surgery

## 2012-10-28 ENCOUNTER — Ambulatory Visit (INDEPENDENT_AMBULATORY_CARE_PROVIDER_SITE_OTHER): Payer: BC Managed Care – PPO | Admitting: Surgery

## 2012-10-28 VITALS — BP 130/74 | HR 70 | Temp 97.7°F | Resp 16 | Ht 65.0 in | Wt 184.8 lb

## 2012-10-28 DIAGNOSIS — K824 Cholesterolosis of gallbladder: Secondary | ICD-10-CM

## 2012-10-28 NOTE — Progress Notes (Signed)
Status post left upper cholecystectomy on 10/08/12. Her  Pathology showed a cholesterol polyp in the wall for gallbladder but no other sign of significant disease. The patient feels much better. Appetite and bowel movements are normal. The postprandial bloating is much improved. She feels great at this point. Her incisions are well-healed with no sign of infection. She may resume full activity including massage therapy. Followup as needed.  Wilmon Arms. Corliss Skains, MD, [facility]  General/ Trauma Surgery  10/28/2012 10:23 AM

## 2012-11-13 ENCOUNTER — Other Ambulatory Visit: Payer: Self-pay

## 2012-11-13 DIAGNOSIS — Z1231 Encounter for screening mammogram for malignant neoplasm of breast: Secondary | ICD-10-CM

## 2012-12-23 ENCOUNTER — Ambulatory Visit: Payer: BC Managed Care – PPO

## 2013-01-02 ENCOUNTER — Ambulatory Visit
Admission: RE | Admit: 2013-01-02 | Discharge: 2013-01-02 | Disposition: A | Discharge status: Home / Self Care | Payer: BC Managed Care – PPO | Source: Ambulatory Visit

## 2013-01-02 DIAGNOSIS — Z1231 Encounter for screening mammogram for malignant neoplasm of breast: Secondary | ICD-10-CM

## 2014-03-10 ENCOUNTER — Other Ambulatory Visit: Payer: Self-pay | Admitting: Orthopedic Surgery

## 2014-03-25 ENCOUNTER — Encounter (HOSPITAL_BASED_OUTPATIENT_CLINIC_OR_DEPARTMENT_OTHER): Payer: Self-pay | Admitting: *Deleted

## 2014-03-25 NOTE — Progress Notes (Signed)
To come in for ekg-bmet 

## 2014-03-29 ENCOUNTER — Encounter (HOSPITAL_BASED_OUTPATIENT_CLINIC_OR_DEPARTMENT_OTHER)
Admission: RE | Admit: 2014-03-29 | Discharge: 2014-03-29 | Disposition: A | Discharge status: Home / Self Care | Payer: BLUE CROSS/BLUE SHIELD | Source: Ambulatory Visit | Attending: Orthopedic Surgery | Admitting: Orthopedic Surgery

## 2014-03-29 DIAGNOSIS — M2241 Chondromalacia patellae, right knee: Secondary | ICD-10-CM | POA: Diagnosis not present

## 2014-03-29 DIAGNOSIS — Z9049 Acquired absence of other specified parts of digestive tract: Secondary | ICD-10-CM | POA: Diagnosis not present

## 2014-03-29 DIAGNOSIS — K219 Gastro-esophageal reflux disease without esophagitis: Secondary | ICD-10-CM | POA: Diagnosis not present

## 2014-03-29 DIAGNOSIS — F419 Anxiety disorder, unspecified: Secondary | ICD-10-CM | POA: Diagnosis not present

## 2014-03-29 DIAGNOSIS — M23200 Derangement of unspecified lateral meniscus due to old tear or injury, right knee: Secondary | ICD-10-CM | POA: Diagnosis not present

## 2014-03-29 DIAGNOSIS — M94261 Chondromalacia, right knee: Secondary | ICD-10-CM | POA: Diagnosis not present

## 2014-03-29 DIAGNOSIS — M23221 Derangement of posterior horn of medial meniscus due to old tear or injury, right knee: Secondary | ICD-10-CM | POA: Diagnosis not present

## 2014-03-29 DIAGNOSIS — B1920 Unspecified viral hepatitis C without hepatic coma: Secondary | ICD-10-CM | POA: Diagnosis not present

## 2014-03-29 DIAGNOSIS — G43909 Migraine, unspecified, not intractable, without status migrainosus: Secondary | ICD-10-CM | POA: Diagnosis not present

## 2014-03-29 DIAGNOSIS — Z888 Allergy status to other drugs, medicaments and biological substances status: Secondary | ICD-10-CM | POA: Diagnosis not present

## 2014-03-29 DIAGNOSIS — Z882 Allergy status to sulfonamides status: Secondary | ICD-10-CM | POA: Diagnosis not present

## 2014-03-29 DIAGNOSIS — I10 Essential (primary) hypertension: Secondary | ICD-10-CM | POA: Diagnosis not present

## 2014-03-29 LAB — BASIC METABOLIC PANEL
Anion gap: 7 (ref 5–15)
BUN: 16 mg/dL (ref 6–23)
CHLORIDE: 104 mmol/L (ref 96–112)
CO2: 24 mmol/L (ref 19–32)
CREATININE: 0.69 mg/dL (ref 0.50–1.10)
Calcium: 9.6 mg/dL (ref 8.4–10.5)
GFR calc non Af Amer: >90 mL/min (ref >90)
GLUCOSE: 115 mg/dL — AB (ref 70–99)
Potassium: 4.3 mmol/L (ref 3.5–5.1)
Sodium: 135 mmol/L (ref 135–145)

## 2014-03-30 NOTE — H&P (Signed)
Alexis Braun is an 64 y.o. female.   Chief Complaint: Right knee Pain  HPI: Patient is here today for followup on right knee pain.  This patient was last seen on 02/26/14 at which time she received a cortisone injection by Dr. Rip Harbour.  She also received an aspiration.  Patient states that she has had significant relief with the cortisone injection however, she continues to have swelling.  She denies any new injury.  She denies any fevers chills night sweats or other signs of infection.  She does use meloxicam off and on as needed.  She also complains of back pain for many years and she was examined by Dr. Mayer Camel for her back pain on 07/07/13.  She notices right thigh numbness or and she has done therapy for the back in the past with only minimal relief.  She denies any change in bowel or bladder function.    Past Medical History  Diagnosis Date  . Chest pain   . Migraines   . Ovarian cyst 04/03/1999  . Positive hepatitis C antibody test     at age 79; has been told that she "self cured" No current or past treatment.  Marland Kitchen GERD (gastroesophageal reflux disease)   . Allergy     sulfa  . Arthritis   . Hypertension     on medication  . Gallbladder polyp 2014  . Anxiety   . PONV (postoperative nausea and vomiting)   . Congenital hearing loss     right  . Hepatitis     as teen-iv drugs  . Wears hearing aid     right    Past Surgical History  Procedure Laterality Date  . Oophorectomy  2002    Right   . Colonoscopy      2007  . Sigmoidoscopy      1999  . Cholecystectomy N/A 10/08/2012    Procedure: LAPAROSCOPIC CHOLECYSTECTOMY WITH INTRAOPERATIVE CHOLANGIOGRAM;  Surgeon: Imogene Burn. Georgette Dover, MD;  Location: [facility];  Service: General;  Laterality: N/A;    Family History  Problem Relation Age of Onset  . Colon cancer Neg Hx   . Heart disease Father   . Heart attack Father   . Heart disease Mother   . Dementia Mother   . Migraines Mother    Social History:  reports that she has never  smoked. She has never used smokeless tobacco. She reports that she drinks alcohol. She reports that she does not use illicit drugs.  Allergies:  Allergies  Allergen Reactions  . Sulfa Antibiotics Anaphylaxis and Swelling    Nose & throat swells  . Statins Other (See Comments)    History of Hepatitis C Patient will not take them    No prescriptions prior to admission    Results for orders placed or performed during the hospital encounter of 03/31/14 (from the past 48 hour(s))  Basic metabolic panel     Status: Abnormal   Collection Time: 03/29/14 11:15 AM  Result Value Ref Range   Sodium 135 135 - 145 mmol/L   Potassium 4.3 3.5 - 5.1 mmol/L   Chloride 104 96 - 112 mmol/L   CO2 24 19 - 32 mmol/L   Glucose, Bld 115 (H) 70 - 99 mg/dL   BUN 16 6 - 23 mg/dL   Creatinine, Ser 0.69 0.50 - 1.10 mg/dL   Calcium 9.6 8.4 - 10.5 mg/dL   GFR calc non Af Amer >90 >90 mL/min   GFR calc Af Amer >90 >90  mL/min    Comment: (NOTE) The eGFR has been calculated using the CKD EPI equation. This calculation has not been validated in all clinical situations. eGFR's persistently <90 mL/min signify possible Chronic Kidney Disease.    Anion gap 7 5 - 15   No results found.  Review of Systems  Constitutional: Negative.   HENT: Negative.   Eyes: Negative.   Respiratory: Negative.   Cardiovascular: Negative.   Gastrointestinal: Negative.   Genitourinary: Negative.   Musculoskeletal: Positive for myalgias and joint pain.  Skin: Negative.   Neurological: Negative.   Psychiatric/Behavioral: Negative.     Height _0  (1.651 m), weight 83.462 kg (184 lb). Physical Exam  Constitutional: She is oriented to person, place, and time. She appears well-developed and well-nourished.  HENT:  Head: Normocephalic and atraumatic.  Eyes: Pupils are equal, round, and reactive to light.  Neck: Normal range of motion. Neck supple.  Cardiovascular: Intact distal pulses.   Respiratory: Effort normal.   Musculoskeletal: She exhibits tenderness.  Today, the patient has a moderate effusion.  No noticeable erythema or warmth.  She has mild crepitance with range of motion.  She does have mild pain with extremes of flexion and lacks proximally and 5 of terminal flexion and pared to the left knee.  She has minimal pain with flexion extension rotation and lateral bend of the lumbar spine.  She does have altered sensation over the right lateral thigh.  Deep tendon reflexes are 1+ bilaterally.  Negative clonus and negative Babinski.  She has good strength bilaterally in her lower extremities.  She is neurovascularly intact distally.  Neurological: She is alert and oriented to person, place, and time.  Skin: Skin is warm and dry.  Psychiatric: She has a normal mood and affect. Her behavior is normal. Judgment and thought content normal.     Assessment/Plan Assess: Right knee pain with concerns for degenerative medial meniscal tear                Plan: Treatment options are discussed with the patient.   Patient has elected to proceed with a right knee arthroscopy and a posting slip is completed.  The benefits, risks and potential complications of surgery are discussed with the patient.  She is to discuss scheduling with Sandi Raveling.  She is asked followup as needed.  We will discuss results of the MRI once she returns.  Call with any issues.  Lester Platas R 03/30/2014, 7:52 PM

## 2014-03-31 ENCOUNTER — Encounter (HOSPITAL_BASED_OUTPATIENT_CLINIC_OR_DEPARTMENT_OTHER)
Admission: RE | Disposition: A | Discharge status: Home / Self Care | Payer: Self-pay | Source: Ambulatory Visit | Attending: Orthopedic Surgery

## 2014-03-31 ENCOUNTER — Ambulatory Visit (HOSPITAL_BASED_OUTPATIENT_CLINIC_OR_DEPARTMENT_OTHER): Payer: BLUE CROSS/BLUE SHIELD | Admitting: Anesthesiology

## 2014-03-31 ENCOUNTER — Encounter (HOSPITAL_BASED_OUTPATIENT_CLINIC_OR_DEPARTMENT_OTHER): Payer: Self-pay | Admitting: *Deleted

## 2014-03-31 ENCOUNTER — Ambulatory Visit (HOSPITAL_BASED_OUTPATIENT_CLINIC_OR_DEPARTMENT_OTHER)
Admission: RE | Admit: 2014-03-31 | Discharge: 2014-03-31 | Disposition: A | Discharge status: Home / Self Care | Payer: BLUE CROSS/BLUE SHIELD | Source: Ambulatory Visit | Attending: Orthopedic Surgery | Admitting: Orthopedic Surgery

## 2014-03-31 DIAGNOSIS — M23221 Derangement of posterior horn of medial meniscus due to old tear or injury, right knee: Secondary | ICD-10-CM | POA: Diagnosis not present

## 2014-03-31 DIAGNOSIS — Z9049 Acquired absence of other specified parts of digestive tract: Secondary | ICD-10-CM | POA: Insufficient documentation

## 2014-03-31 DIAGNOSIS — M94261 Chondromalacia, right knee: Secondary | ICD-10-CM | POA: Insufficient documentation

## 2014-03-31 DIAGNOSIS — Z882 Allergy status to sulfonamides status: Secondary | ICD-10-CM | POA: Insufficient documentation

## 2014-03-31 DIAGNOSIS — G43909 Migraine, unspecified, not intractable, without status migrainosus: Secondary | ICD-10-CM | POA: Insufficient documentation

## 2014-03-31 DIAGNOSIS — I10 Essential (primary) hypertension: Secondary | ICD-10-CM | POA: Insufficient documentation

## 2014-03-31 DIAGNOSIS — Z888 Allergy status to other drugs, medicaments and biological substances status: Secondary | ICD-10-CM | POA: Insufficient documentation

## 2014-03-31 DIAGNOSIS — B1920 Unspecified viral hepatitis C without hepatic coma: Secondary | ICD-10-CM | POA: Insufficient documentation

## 2014-03-31 DIAGNOSIS — M2241 Chondromalacia patellae, right knee: Secondary | ICD-10-CM | POA: Insufficient documentation

## 2014-03-31 DIAGNOSIS — M23200 Derangement of unspecified lateral meniscus due to old tear or injury, right knee: Secondary | ICD-10-CM | POA: Insufficient documentation

## 2014-03-31 DIAGNOSIS — K219 Gastro-esophageal reflux disease without esophagitis: Secondary | ICD-10-CM | POA: Insufficient documentation

## 2014-03-31 DIAGNOSIS — F419 Anxiety disorder, unspecified: Secondary | ICD-10-CM | POA: Insufficient documentation

## 2014-03-31 HISTORY — DX: Presence of external hearing-aid: Z97.4

## 2014-03-31 HISTORY — PX: CHONDROPLASTY: SHX5177

## 2014-03-31 HISTORY — PX: KNEE ARTHROSCOPY WITH MEDIAL MENISECTOMY: SHX5651

## 2014-03-31 HISTORY — DX: Inflammatory liver disease, unspecified: K75.9

## 2014-03-31 HISTORY — DX: Unspecified sensorineural hearing loss: H90.5

## 2014-03-31 HISTORY — PX: KNEE ARTHROSCOPY WITH LATERAL MENISECTOMY: SHX6193

## 2014-03-31 HISTORY — DX: Other specified postprocedural states: Z98.890

## 2014-03-31 HISTORY — DX: Nausea with vomiting, unspecified: R11.2

## 2014-03-31 LAB — GLUCOSE, CAPILLARY: Glucose-Capillary: 101 mg/dL — ABNORMAL HIGH (ref 70–99)

## 2014-03-31 SURGERY — ARTHROSCOPY, KNEE, WITH LATERAL MENISCECTOMY
Anesthesia: General | Site: Knee | Laterality: Right

## 2014-03-31 MED ORDER — CEFAZOLIN SODIUM-DEXTROSE 2-3 GM-% IV SOLR
INTRAVENOUS | Status: AC
  Filled 2014-03-31: qty 50

## 2014-03-31 MED ORDER — MIDAZOLAM HCL 2 MG/2ML IJ SOLN
INTRAMUSCULAR | Status: AC
  Filled 2014-03-31: qty 2

## 2014-03-31 MED ORDER — SCOPOLAMINE 1 MG/3DAYS TD PT72
MEDICATED_PATCH | TRANSDERMAL | Status: AC
  Filled 2014-03-31: qty 1

## 2014-03-31 MED ORDER — HYDROMORPHONE HCL 1 MG/ML IJ SOLN
0.2500 mg | INTRAMUSCULAR | Status: DC | PRN
  Administered 2014-03-31: 0.5 mg via INTRAVENOUS
  Administered 2014-03-31: 0.25 mg via INTRAVENOUS

## 2014-03-31 MED ORDER — HYDROMORPHONE HCL 1 MG/ML IJ SOLN
INTRAMUSCULAR | Status: AC
  Filled 2014-03-31: qty 1

## 2014-03-31 MED ORDER — LACTATED RINGERS IV SOLN
INTRAVENOUS | Status: DC
  Administered 2014-03-31 (×3): via INTRAVENOUS

## 2014-03-31 MED ORDER — FENTANYL CITRATE 0.05 MG/ML IJ SOLN
INTRAMUSCULAR | Status: AC
  Filled 2014-03-31: qty 4

## 2014-03-31 MED ORDER — BUPIVACAINE-EPINEPHRINE 0.5% -1:200000 IJ SOLN
INTRAMUSCULAR | Status: DC | PRN
  Administered 2014-03-31: 20 mL

## 2014-03-31 MED ORDER — MIDAZOLAM HCL 5 MG/5ML IJ SOLN
INTRAMUSCULAR | Status: DC | PRN
  Administered 2014-03-31: 2 mg via INTRAVENOUS

## 2014-03-31 MED ORDER — LIDOCAINE HCL (CARDIAC) 20 MG/ML IV SOLN
INTRAVENOUS | Status: DC | PRN
  Administered 2014-03-31: 50 mg via INTRAVENOUS

## 2014-03-31 MED ORDER — OXYCODONE HCL 5 MG PO TABS: 5.0000 mg | ORAL_TABLET | Freq: Once | ORAL | Status: DC | PRN

## 2014-03-31 MED ORDER — PROPOFOL 10 MG/ML IV BOLUS
INTRAVENOUS | Status: DC | PRN
  Administered 2014-03-31: 200 mg via INTRAVENOUS

## 2014-03-31 MED ORDER — MIDAZOLAM HCL 2 MG/2ML IJ SOLN: 1.0000 mg | INTRAMUSCULAR | Status: DC | PRN

## 2014-03-31 MED ORDER — ONDANSETRON 8 MG PO TBDP
ORAL_TABLET | ORAL | Status: AC
  Filled 2014-03-31: qty 1

## 2014-03-31 MED ORDER — FENTANYL CITRATE 0.05 MG/ML IJ SOLN
INTRAMUSCULAR | Status: DC | PRN
  Administered 2014-03-31: 100 ug via INTRAVENOUS

## 2014-03-31 MED ORDER — SODIUM CHLORIDE 0.9 % IR SOLN
Status: DC | PRN
  Administered 2014-03-31: 3000 mL

## 2014-03-31 MED ORDER — BUPIVACAINE-EPINEPHRINE (PF) 0.5% -1:200000 IJ SOLN
INTRAMUSCULAR | Status: AC
  Filled 2014-03-31: qty 30

## 2014-03-31 MED ORDER — CEFAZOLIN SODIUM-DEXTROSE 2-3 GM-% IV SOLR
2.0000 g | INTRAVENOUS | Status: AC
  Administered 2014-03-31: 2 g via INTRAVENOUS

## 2014-03-31 MED ORDER — GLYCOPYRROLATE 0.2 MG/ML IJ SOLN
INTRAMUSCULAR | Status: DC | PRN
  Administered 2014-03-31: 0.2 mg via INTRAVENOUS

## 2014-03-31 MED ORDER — FENTANYL CITRATE 0.05 MG/ML IJ SOLN: 50.0000 ug | INTRAMUSCULAR | Status: DC | PRN

## 2014-03-31 MED ORDER — OXYCODONE HCL 5 MG/5ML PO SOLN: 5.0000 mg | Freq: Once | ORAL | Status: DC | PRN

## 2014-03-31 MED ORDER — ONDANSETRON HCL 4 MG/2ML IJ SOLN
INTRAMUSCULAR | Status: DC | PRN
  Administered 2014-03-31: 4 mg via INTRAVENOUS

## 2014-03-31 MED ORDER — ONDANSETRON HCL 4 MG/2ML IJ SOLN: 4.0000 mg | Freq: Once | INTRAMUSCULAR | Status: DC | PRN

## 2014-03-31 MED ORDER — HYDROCODONE-ACETAMINOPHEN 5-325 MG PO TABS: 1.0000 | ORAL_TABLET | Freq: Four times a day (QID) | ORAL | Status: DC | PRN

## 2014-03-31 MED ORDER — CHLORHEXIDINE GLUCONATE 4 % EX LIQD: 60.0000 mL | Freq: Once | CUTANEOUS | Status: DC

## 2014-03-31 MED ORDER — ONDANSETRON 8 MG PO TBDP
8.0000 mg | ORAL_TABLET | Freq: Once | ORAL | Status: AC
  Administered 2014-03-31: 8 mg via ORAL

## 2014-03-31 MED ORDER — DEXTROSE-NACL 5-0.45 % IV SOLN: INTRAVENOUS | Status: DC

## 2014-03-31 MED ORDER — DEXAMETHASONE SODIUM PHOSPHATE 10 MG/ML IJ SOLN
INTRAMUSCULAR | Status: DC | PRN
  Administered 2014-03-31: 10 mg via INTRAVENOUS

## 2014-03-31 MED ORDER — SCOPOLAMINE 1 MG/3DAYS TD PT72
1.0000 | MEDICATED_PATCH | TRANSDERMAL | Status: DC
  Administered 2014-03-31: 1.5 mg via TRANSDERMAL

## 2014-03-31 SURGICAL SUPPLY — 43 items
BANDAGE ELASTIC 6 VELCRO ST LF (GAUZE/BANDAGES/DRESSINGS) ×2 IMPLANT
BLADE 4.2CUDA (BLADE) IMPLANT
BLADE CUTTER GATOR 3.5 (BLADE) IMPLANT
BLADE GREAT WHITE 4.2 (BLADE) IMPLANT
BNDG COHESIVE 6X5 TAN STRL LF (GAUZE/BANDAGES/DRESSINGS) ×2 IMPLANT
DRAPE ARTHROSCOPY W/POUCH 114 (DRAPES) ×2 IMPLANT
DURAPREP 26ML APPLICATOR (WOUND CARE) ×2 IMPLANT
ELECT MENISCUS 165MM 90D (ELECTRODE) IMPLANT
ELECT REM PT RETURN 9FT ADLT (ELECTROSURGICAL)
ELECTRODE REM PT RTRN 9FT ADLT (ELECTROSURGICAL) IMPLANT
GAUZE SPONGE 4X4 12PLY STRL (GAUZE/BANDAGES/DRESSINGS) ×2 IMPLANT
GAUZE XEROFORM 1X8 LF (GAUZE/BANDAGES/DRESSINGS) ×2 IMPLANT
GLOVE BIO SURGEON STRL SZ7.5 (GLOVE) ×2 IMPLANT
GLOVE BIO SURGEON STRL SZ8.5 (GLOVE) ×2 IMPLANT
GLOVE BIOGEL PI IND STRL 7.0 (GLOVE) ×1 IMPLANT
GLOVE BIOGEL PI IND STRL 7.5 (GLOVE) ×1 IMPLANT
GLOVE BIOGEL PI IND STRL 8 (GLOVE) ×1 IMPLANT
GLOVE BIOGEL PI IND STRL 9 (GLOVE) ×1 IMPLANT
GLOVE BIOGEL PI INDICATOR 7.0 (GLOVE) ×1
GLOVE BIOGEL PI INDICATOR 7.5 (GLOVE) ×1
GLOVE BIOGEL PI INDICATOR 8 (GLOVE) ×1
GLOVE BIOGEL PI INDICATOR 9 (GLOVE) ×1
GLOVE SURG SS PI 7.5 STRL IVOR (GLOVE) ×2 IMPLANT
GOWN STRL REUS W/ TWL LRG LVL3 (GOWN DISPOSABLE) ×1 IMPLANT
GOWN STRL REUS W/TWL LRG LVL3 (GOWN DISPOSABLE) ×1
GOWN STRL REUS W/TWL XL LVL3 (GOWN DISPOSABLE) ×2 IMPLANT
IV NS IRRIG 3000ML ARTHROMATIC (IV SOLUTION) ×2 IMPLANT
KNEE WRAP E Z 3 GEL PACK (MISCELLANEOUS) ×2 IMPLANT
MANIFOLD NEPTUNE II (INSTRUMENTS) IMPLANT
NDL SAFETY ECLIPSE 18X1.5 (NEEDLE) ×1 IMPLANT
NEEDLE HYPO 18GX1.5 SHARP (NEEDLE) ×1
PACK ARTHROSCOPY DSU (CUSTOM PROCEDURE TRAY) ×2 IMPLANT
PACK BASIN DAY SURGERY FS (CUSTOM PROCEDURE TRAY) ×2 IMPLANT
PAD ALCOHOL SWAB (MISCELLANEOUS) ×2 IMPLANT
PENCIL BUTTON HOLSTER BLD 10FT (ELECTRODE) IMPLANT
SET ARTHROSCOPY TUBING (MISCELLANEOUS) ×1
SET ARTHROSCOPY TUBING LN (MISCELLANEOUS) ×1 IMPLANT
SLEEVE SCD COMPRESS KNEE MED (MISCELLANEOUS) IMPLANT
SYR 3ML 18GX1 1/2 (SYRINGE) IMPLANT
SYR 5ML LL (SYRINGE) ×2 IMPLANT
TOWEL OR 17X24 6PK STRL BLUE (TOWEL DISPOSABLE) ×2 IMPLANT
WAND STAR VAC 90 (SURGICAL WAND) IMPLANT
WATER STERILE IRR 1000ML POUR (IV SOLUTION) ×2 IMPLANT

## 2014-03-31 NOTE — Interval H&P Note (Signed)
History and Physical Interval Note:  03/31/2014 11:43 AM  Alexis Braun  has presented today for surgery, with the diagnosis of RIGHT KNEE MEDIAL MENISCUS TEAR  The various methods of treatment have been discussed with the patient and family. After consideration of risks, benefits and other options for treatment, the patient has consented to  Procedure(s): RIGHT ARTHROSCOPY KNEE (Right) as a surgical intervention .  The patient's history has been reviewed, patient examined, no change in status, stable for surgery.  I have reviewed the patient's chart and labs.  Questions were answered to the patient's satisfaction.     Kerin Salen

## 2014-03-31 NOTE — Discharge Instructions (Addendum)
Arthroscopic Procedure, Knee °An arthroscopic procedure can find what is wrong with your knee. °PROCEDURE °Arthroscopy is a surgical technique that allows your orthopedic surgeon to diagnose and treat your knee injury with accuracy. They will look into your knee through a small instrument. This is almost like a small (pencil sized) telescope. Because arthroscopy affects your knee less than open knee surgery, you can anticipate a more rapid recovery. Taking an active role by following your caregiver's instructions will help with rapid and complete recovery. Use crutches, rest, elevation, ice, and knee exercises as instructed. The length of recovery depends on various factors including type of injury, age, physical condition, medical conditions, and your rehabilitation. °Your knee is the joint between the large bones (femur and tibia) in your leg. Cartilage covers these bone ends which are smooth and slippery and allow your knee to bend and move smoothly. Two menisci, thick, semi-lunar shaped pads of cartilage which form a rim inside the joint, help absorb shock and stabilize your knee. Ligaments bind the bones together and support your knee joint. Muscles move the joint, help support your knee, and take stress off the joint itself. Because of this all programs and physical therapy to rehabilitate an injured or repaired knee require rebuilding and strengthening your muscles. °AFTER THE PROCEDURE °· After the procedure, you will be moved to a recovery area until most of the effects of the medication have worn off. Your caregiver will discuss the test results with you. °· Only take over-the-counter or prescription medicines for pain, discomfort, or fever as directed by your caregiver. °SEEK MEDICAL CARE IF:  °· You have increased bleeding from your wounds. °· You see redness, swelling, or have increasing pain in your wounds. °· You have pus coming from your wound. °· You have an oral temperature above 102° F (38.9°  C). °· You notice a bad smell coming from the wound or dressing. °· You have severe pain with any motion of your knee. °SEEK IMMEDIATE MEDICAL CARE IF:  °· You develop a rash. °· You have difficulty breathing. °· You have any allergic problems. °Document Released: 12/30/1999 Document Revised: 03/26/2011 Document Reviewed: 07/23/2007 °[employer]® Patient Information ©2015 [employer]. This information is not intended to replace advice given to you by your health care provider. Make sure you discuss any questions you have with your health care provider. ° °Post Anesthesia Home Care Instructions ° °Activity: °Get plenty of rest for the remainder of the day. A responsible adult should stay with you for 24 hours following the procedure.  °For the next 24 hours, DO NOT: °-Drive a car °-Operate machinery °-Drink alcoholic beverages °-Take any medication unless instructed by your physician °-Make any legal decisions or sign important papers. ° °Meals: °Start with liquid foods such as gelatin or soup. Progress to regular foods as tolerated. Avoid greasy, spicy, heavy foods. If nausea and/or vomiting occur, drink only clear liquids until the nausea and/or vomiting subsides. Call your physician if vomiting continues. ° °Special Instructions/Symptoms: °Your throat may feel dry or sore from the anesthesia or the breathing tube placed in your throat during surgery. If this causes discomfort, gargle with warm salt water. The discomfort should disappear within 24 hours. ° °

## 2014-03-31 NOTE — Op Note (Addendum)
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 Mayer Camel M.D.  Assist: Kerry Hough. Barton Dubois  (present throughout entire procedure and necessary for timely completion of the procedure) Anes: General LMA  EBL: Minimal  Fluids: 800 cc   Indications: Long history of medial joint line catching and pain as well as posterior joint line pain to the right knee. Recent MRI scan showed medial meniscal tear and chondromalacia.. Pt has failed conservative treatment with anti-inflammatory medicines, physical therapy, and modified activites but did get good temporarily from an intra-articular cortisone injection. Pain has recurred and patient desires elective arthroscopic evaluation and treatment of knee. Risks and benefits of surgery have been discussed and questions answered.  Procedure: Patient identified by arm band and taken to the operating room at the day surgery Center. The appropriate anesthetic monitors were attached, and General LMA anesthesia was induced without difficulty. Lateral post was applied to the table and the lower extremity was prepped and draped in usual sterile fashion from the ankle to the midthigh. Time out procedure was performed. We began the operation by making standard inferior lateral and inferior medial peripatellar portals with a #11 blade allowing introduction of the arthroscope through the inferior lateral portal and the out flow to the inferior medial portal. Pump pressure was set at 100 mmHg and diagnostic  arthroscopy  revealed minimal chondromalacia of the patellofemoral joint grade 2 lightly debrided. On the medial compartment the patient did have degenerative tearing of the medial and posterior horns of the medial meniscus debrided back to a stable margin with a 3.5 mm Gator sucker shaver. There was grade 2 chondromalacia to the medial femoral condyle also lightly debrided and the medial tibial plateau had grade 3 chondromalacia globally and a 1 cm area of focal grade 4 chondromalacia that was debrided back to a stable margin.using the 3.5 mm gator sucker shaver we then performed abrasion arthroplasty ,, resecting back to bleeding bone, to bring in fibrocartilage and healed the defect.. The anterior cruciate ligament was intact thoroughly probed and we did not encounter a cyst on the anterior cruciate ligament. On the lateral side there was degenerative tearing lateral meniscus also debrided back to stable margin inner one third of the rim. The lateral tibial plateau grade 3 chondromalacia globally and was debrided back removing a little over one half of the thickness of the articular cartilage of the lateral tibial plateau. Photographic documentation was made of the pathology and repair.. The knee was irrigated out normal saline solution. 10 mL of half percent Marcaine with epinephrine solution was injected into the knee, another 10 mL into the inferomedial and lateral peripatellar portals. A dressing of xerofoam 4 x 4 dressing sponges, web roll and an Ace wrap was applied. The patient was awakened extubated and taken to the recovery without difficulty.    Signed: Kerin Salen, MD

## 2014-03-31 NOTE — Anesthesia Postprocedure Evaluation (Signed)
  Anesthesia Post-op Note  Patient: Alexis Braun  Procedure(s) Performed: Procedure(s): KNEE ARTHROSCOPY WITH  PARTIAL LATERAL MENISECTOMY (Right) KNEE ARTHROSCOPY WITH PARTIALMEDIAL MENISECTOMY (Right) CHONDROPLASTY (Right)  Patient Location: PACU  Anesthesia Type: General   Level of Consciousness: awake, alert  and oriented  Airway and Oxygen Therapy: Patient Spontanous Breathing  Post-op Pain: mild  Post-op Assessment: Post-op Vital signs reviewed  Post-op Vital Signs: Reviewed  Last Vitals:  Filed Vitals:   03/31/14 1253  BP:   Pulse: 59  Temp:   Resp: 11    Complications: No apparent anesthesia complications

## 2014-03-31 NOTE — Anesthesia Preprocedure Evaluation (Signed)
Anesthesia Evaluation  Patient identified by MRN, date of birth, ID band Patient awake    Reviewed: Allergy & Precautions, NPO status , Patient's Chart, lab work & pertinent test results  History of Anesthesia Complications (+) PONV  Airway Mallampati: I  TM Distance: >3 FB Neck ROM: Full    Dental  (+) Teeth Intact, Dental Advisory Given   Pulmonary  breath sounds clear to auscultation        Cardiovascular hypertension, Pt. on medications Rhythm:Regular Rate:Normal     Neuro/Psych    GI/Hepatic GERD-  ,  Endo/Other    Renal/GU      Musculoskeletal   Abdominal   Peds  Hematology   Anesthesia Other Findings   Reproductive/Obstetrics                             Anesthesia Physical Anesthesia Plan  ASA: II  Anesthesia Plan: General   Post-op Pain Management:    Induction: Intravenous  Airway Management Planned: LMA  Additional Equipment:   Intra-op Plan:   Post-operative Plan: Extubation in OR  Informed Consent: I have reviewed the patients History and Physical, chart, labs and discussed the procedure including the risks, benefits and alternatives for the proposed anesthesia with the patient or authorized representative who has indicated his/her understanding and acceptance.   Dental advisory given  Plan Discussed with: CRNA, Anesthesiologist and Surgeon  Anesthesia Plan Comments:         Anesthesia Quick Evaluation

## 2014-03-31 NOTE — Transfer of Care (Signed)
Immediate Anesthesia Transfer of Care Note  Patient: Alexis Braun  Procedure(s) Performed: Procedure(s): KNEE ARTHROSCOPY WITH  PARTIAL LATERAL MENISECTOMY (Right)  Patient Location: PACU  Anesthesia Type:General  Level of Consciousness: sedated  Airway & Oxygen Therapy: Patient Spontanous Breathing and Patient connected to face mask oxygen  Post-op Assessment: Report given to RN and Post -op Vital signs reviewed and stable  Post vital signs: Reviewed and stable  Last Vitals:  Filed Vitals:   03/31/14 1033  BP: 144/74  Pulse: 51  Temp: 36.6 C  Resp: 16    Complications: No apparent anesthesia complications

## 2014-03-31 NOTE — Anesthesia Procedure Notes (Signed)
Procedure Name: LMA Insertion Date/Time: 03/31/2014 11:50 AM Performed by: Lieutenant Diego Pre-anesthesia Checklist: Patient identified, Emergency Drugs available, Suction available and Patient being monitored Patient Re-evaluated:Patient Re-evaluated prior to inductionOxygen Delivery Method: Circle System Utilized Preoxygenation: Pre-oxygenation with 100% oxygen Intubation Type: IV induction Ventilation: Mask ventilation without difficulty LMA: LMA inserted LMA Size: 4.0 Number of attempts: 1 Airway Equipment and Method: Bite block Placement Confirmation: positive ETCO2 and breath sounds checked- equal and bilateral Tube secured with: Tape Dental Injury: Teeth and Oropharynx as per pre-operative assessment

## 2014-04-01 ENCOUNTER — Encounter (HOSPITAL_BASED_OUTPATIENT_CLINIC_OR_DEPARTMENT_OTHER): Payer: Self-pay | Admitting: Orthopedic Surgery

## 2014-04-04 ENCOUNTER — Emergency Department (HOSPITAL_COMMUNITY)
Admission: EM | Admit: 2014-04-04 | Discharge: 2014-04-04 | Disposition: A | Discharge status: Home / Self Care | Payer: BLUE CROSS/BLUE SHIELD | Attending: Emergency Medicine | Admitting: Emergency Medicine

## 2014-04-04 ENCOUNTER — Encounter (HOSPITAL_COMMUNITY): Payer: Self-pay | Admitting: Emergency Medicine

## 2014-04-04 DIAGNOSIS — Z79899 Other long term (current) drug therapy: Secondary | ICD-10-CM | POA: Diagnosis not present

## 2014-04-04 DIAGNOSIS — Z8619 Personal history of other infectious and parasitic diseases: Secondary | ICD-10-CM | POA: Insufficient documentation

## 2014-04-04 DIAGNOSIS — Z7982 Long term (current) use of aspirin: Secondary | ICD-10-CM | POA: Diagnosis not present

## 2014-04-04 DIAGNOSIS — I10 Essential (primary) hypertension: Secondary | ICD-10-CM | POA: Insufficient documentation

## 2014-04-04 DIAGNOSIS — H905 Unspecified sensorineural hearing loss: Secondary | ICD-10-CM | POA: Insufficient documentation

## 2014-04-04 DIAGNOSIS — G43909 Migraine, unspecified, not intractable, without status migrainosus: Secondary | ICD-10-CM | POA: Diagnosis not present

## 2014-04-04 DIAGNOSIS — M1990 Unspecified osteoarthritis, unspecified site: Secondary | ICD-10-CM | POA: Diagnosis not present

## 2014-04-04 DIAGNOSIS — M7989 Other specified soft tissue disorders: Secondary | ICD-10-CM | POA: Diagnosis not present

## 2014-04-04 DIAGNOSIS — G8918 Other acute postprocedural pain: Secondary | ICD-10-CM | POA: Insufficient documentation

## 2014-04-04 DIAGNOSIS — K219 Gastro-esophageal reflux disease without esophagitis: Secondary | ICD-10-CM | POA: Insufficient documentation

## 2014-04-04 DIAGNOSIS — Z8742 Personal history of other diseases of the female genital tract: Secondary | ICD-10-CM | POA: Insufficient documentation

## 2014-04-04 DIAGNOSIS — M79661 Pain in right lower leg: Secondary | ICD-10-CM | POA: Diagnosis present

## 2014-04-04 DIAGNOSIS — F419 Anxiety disorder, unspecified: Secondary | ICD-10-CM | POA: Insufficient documentation

## 2014-04-04 DIAGNOSIS — Z974 Presence of external hearing-aid: Secondary | ICD-10-CM | POA: Diagnosis not present

## 2014-04-04 DIAGNOSIS — M79609 Pain in unspecified limb: Secondary | ICD-10-CM | POA: Diagnosis not present

## 2014-04-04 NOTE — ED Notes (Signed)
Pt. Stated, I had arthroscopic surgery on Wednesday and then it started to swell.  I went to [facility] and they game me some medicine, but I think I probably have a blood clot.

## 2014-04-04 NOTE — ED Notes (Signed)
Patient advised vascular team has been paged and is on their way.

## 2014-04-04 NOTE — ED Notes (Signed)
Patient returned and hooked up.

## 2014-04-04 NOTE — ED Provider Notes (Signed)
CSN: [id number]     Arrival date & time 04/04/14  1004 History   First MD Initiated Contact with Patient 04/04/14 1052     Chief Complaint  Patient presents with  . Leg Pain     (Consider location/radiation/quality/duration/timing/severity/associated sxs/prior Treatment) HPI The patient is status post knee surgery, arthroscopic 4 days ago. She reports she is noticed some increased swelling in the lower leg and had concern for possible blood clot. She reports that the pain has not gotten significantly worse. She has only just recently removed the compression and started to use a leg a little more. There is been no associated fever or chills. Past Medical History  Diagnosis Date  . Chest pain   . Migraines   . Ovarian cyst 04/03/1999  . Positive hepatitis C antibody test     at age 23; has been told that she "self cured" No current or past treatment.  Marland Kitchen GERD (gastroesophageal reflux disease)   . Allergy     sulfa  . Arthritis   . Hypertension     on medication  . Gallbladder polyp 2014  . Anxiety   . PONV (postoperative nausea and vomiting)   . Congenital hearing loss     right  . Hepatitis     as teen-iv drugs  . Wears hearing aid     right   Past Surgical History  Procedure Laterality Date  . Oophorectomy  2002    Right   . Colonoscopy      2007  . Sigmoidoscopy      1999  . Cholecystectomy N/A 10/08/2012    Procedure: LAPAROSCOPIC CHOLECYSTECTOMY WITH INTRAOPERATIVE CHOLANGIOGRAM;  Surgeon: Imogene Burn. Georgette Dover, MD;  Location: [facility];  Service: General;  Laterality: N/A;  . Knee arthroscopy with lateral menisectomy Right 03/31/2014    Procedure: KNEE ARTHROSCOPY WITH  PARTIAL LATERAL MENISECTOMY;  Surgeon: Frederik Pear, MD;  Location: [facility];  Service: Orthopedics;  Laterality: Right;  . Knee arthroscopy with medial menisectomy Right 03/31/2014    Procedure: KNEE ARTHROSCOPY WITH PARTIALMEDIAL MENISECTOMY;  Surgeon: Frederik Pear, MD;  Location: [facility];  Service: Orthopedics;  Laterality: Right;  . Chondroplasty Right 03/31/2014    Procedure: CHONDROPLASTY;  Surgeon: Frederik Pear, MD;  Location: [facility];  Service: Orthopedics;  Laterality: Right;   Family History  Problem Relation Age of Onset  . Colon cancer Neg Hx   . Heart disease Father   . Heart attack Father   . Heart disease Mother   . Dementia Mother   . Migraines Mother    History  Substance Use Topics  . Smoking status: Never Smoker   . Smokeless tobacco: Never Used  . Alcohol Use: Yes     Comment: occ; twice monthly   OB History    No data available     Review of Systems Constitutional: No fevers no chills Respiratory: No chest pain no shortness of breath no cough.   Allergies  Sulfa antibiotics and Statins  Home Medications   Prior to Admission medications   Medication Sig Start Date End Date Taking? Authorizing Provider  ALPRAZolam Duanne Moron) 0.5 MG tablet Take 0.5 mg by mouth at bedtime as needed for sleep.    Historical Provider, MD  aspirin EC 81 MG tablet Take 81 mg by mouth daily.    Historical Provider, MD  atenolol (TENORMIN) 50 MG tablet Take 25 mg by mouth daily.     Historical Provider, MD  cyclobenzaprine (  FLEXERIL) 10 MG tablet Take 10 mg by mouth daily as needed for muscle spasms.    Historical Provider, MD  estradiol (ESTRACE) 0.1 MG/GM vaginal cream Place vaginally as directed.     Historical Provider, MD  Flaxseed, Linseed, (FLAX SEED OIL PO) Take 1 capsule by mouth daily.    Historical Provider, MD  HYDROcodone-acetaminophen (NORCO) 5-325 MG per tablet Take 1 tablet by mouth every 6 (six) hours as needed. 03/31/14   Leighton Parody, PA-C  ibuprofen (ADVIL,MOTRIN) 200 MG tablet Take 200-400 mg by mouth daily as needed for pain.     Historical Provider, MD  Omega-3 Fatty Acids (FISH OIL) 1000 MG CAPS Take 1 capsule by mouth daily.     Historical Provider, MD  omeprazole (PRILOSEC) 20 MG capsule Take 20 mg by mouth  daily.    Historical Provider, MD  ondansetron (ZOFRAN) 4 MG tablet Take 1 tablet (4 mg total) by mouth every 6 (six) hours. 10/09/12   Tanna Furry, MD  promethazine (PHENERGAN) 12.5 MG tablet Take 1 tablet (12.5 mg total) by mouth every 6 (six) hours as needed for nausea. 10/08/12   Donnie Mesa, MD  promethazine (PHENERGAN) 25 MG suppository Place 1 suppository (25 mg total) rectally every 6 (six) hours as needed for nausea. 10/09/12   Tanna Furry, MD  ranitidine (ZANTAC) 150 MG tablet Take 150 mg by mouth daily as needed for heartburn.     Historical Provider, MD  SUMAtriptan (IMITREX) 100 MG tablet Take 100 mg by mouth daily as needed for migraine. May repeat in 2 hours if headache persists or recurs.    Historical Provider, MD  traZODone (DESYREL) 50 MG tablet Take 50 mg by mouth at bedtime as needed for sleep.     Historical Provider, MD   BP 128/59 mmHg  Pulse 61  Temp(Src) 98.1 F (36.7 C)  Resp 15  Ht 5' 4.5" (1.638 m)  Wt 190 lb (86.183 kg)  BMI 32.12 kg/m2  SpO2 100% Physical Exam  Constitutional: She is oriented to person, place, and time. She appears well-developed and well-nourished. No distress.  HENT:  Head: Normocephalic and atraumatic.  Eyes: EOM are normal.  Cardiovascular: Normal rate, regular rhythm, normal heart sounds and intact distal pulses.   Pulmonary/Chest: Effort normal and breath sounds normal.  Musculoskeletal:  The right lower extremity has 2 small incision sites over the inferior aspect of the knee. There is no surrounding erythema or drainage. There is mild diffuse swelling of the knee. The calf is soft. Distal pulses are 2+.  Neurological: She is alert and oriented to person, place, and time.  Skin: Skin is warm and dry.  Psychiatric: She has a normal mood and affect.    ED Course  Procedures (including critical care time) Labs Review Labs Reviewed - No data to display  Imaging Review No results found.   EKG Interpretation None      MDM    Final diagnoses:  Pain and swelling of lower leg, right   Duplex ultrasound shows no DVT or superficial venous thrombosis present. The patient's wound appears to be healing appropriately. There is no indication currently that there is postoperative infection. At this point she will be discharged to follow-up with her orthopedic provider.    Charlesetta Shanks, MD 04/04/14 1434

## 2014-04-04 NOTE — Progress Notes (Signed)
VASCULAR LAB PRELIMINARY  PRELIMINARY  PRELIMINARY  PRELIMINARY  Bilateral lower extremity venous Dopplers completed.    Preliminary report:  There is no DVT or SVT noted in the bilateral lower extremities.  There is a small Baker's cyst noted in the right popliteal fossa.    Doren Kaspar, RVT 04/04/2014, 1:19 PM

## 2014-04-04 NOTE — Discharge Instructions (Signed)
Continue your knee care as per your postoperative instructions. Follow-up with your orthopedic doctor next week. Return to the emergency department if you develop redness, fever or increasing pain.

## 2014-05-18 ENCOUNTER — Telehealth: Payer: Self-pay | Admitting: Internal Medicine

## 2014-05-18 NOTE — Telephone Encounter (Signed)
Patient had knee surgery and took opoid's. This caused her to have a painful hemorrhoid. Sometimes when she wipes, she has bright, red blood. Patient will try warm soaks, baby wipes and tuck pads. She reports Preparation H did not help. Scheduled with Tye Savoy, NP  On 05/20/14 at 3:00 PM.

## 2014-05-20 ENCOUNTER — Ambulatory Visit (INDEPENDENT_AMBULATORY_CARE_PROVIDER_SITE_OTHER): Payer: BLUE CROSS/BLUE SHIELD | Admitting: Nurse Practitioner

## 2014-05-20 ENCOUNTER — Encounter: Payer: Self-pay | Admitting: Nurse Practitioner

## 2014-05-20 VITALS — BP 130/82 | HR 76 | Ht 64.5 in | Wt 193.4 lb

## 2014-05-20 DIAGNOSIS — K648 Other hemorrhoids: Secondary | ICD-10-CM | POA: Diagnosis not present

## 2014-05-20 MED ORDER — HYDROCORTISONE ACETATE 25 MG RE SUPP: 25.0000 mg | Freq: Every day | RECTAL | Status: DC

## 2014-05-20 NOTE — Patient Instructions (Addendum)
We have sent in your prescription to your pharmacy   Call us if your no better in 10 days

## 2014-05-21 ENCOUNTER — Encounter: Payer: Self-pay | Admitting: Nurse Practitioner

## 2014-05-21 DIAGNOSIS — K648 Other hemorrhoids: Secondary | ICD-10-CM | POA: Insufficient documentation

## 2014-05-21 NOTE — Progress Notes (Signed)
     History of Present Illness:  Patient is a 64 year old female known to Dr. Olevia Braun. She comes in today for evaluation of bleeding hemorrhoids. Patient historically has had regular bowel movements but got constipated from pain medication several weeks ago (knee surgery). Off opioids, bowels back to normal.   Current Medications, Allergies, Past Medical History, Past Surgical History, Family History and Social History were reviewed in [employer] record.   Physical Exam: General: Pleasant, well developed , white female in no acute distress Eyes:  sclerae anicteric, conjunctiva pink  Ears: Normal auditory acuity Abdomen: Soft, non distended, non-tender. No masses, no hepatomegaly. Normal bowel sounds Rectal: no external lesions. On anoscopy there was an inflamed internal hemorrhoid Musculoskeletal: Symmetrical with no gross deformities  Extremities: No edema  Neurological: Alert oriented x 4, grossly nonfocal Psychological:  Alert and cooperative. Normal mood and affect  Assessment and Recommendations:  64 year old female with bleeding internal hemorrhoid following constipation (pain meds). Off opioids, BMs normal now. No polyps on complete colonoscopy March 2013. Will treat with steroid suppositories QHS for 10 days. Patient to call with condition update following treatment.

## 2014-05-21 NOTE — Progress Notes (Signed)
Reviewed and agree.

## 2014-05-31 ENCOUNTER — Other Ambulatory Visit: Payer: Self-pay

## 2014-05-31 ENCOUNTER — Telehealth: Payer: Self-pay | Admitting: Nurse Practitioner

## 2014-05-31 DIAGNOSIS — Z1231 Encounter for screening mammogram for malignant neoplasm of breast: Secondary | ICD-10-CM

## 2014-05-31 NOTE — Telephone Encounter (Signed)
Per pt she saw Alexis Braun and was told to try hemorrhoid medicine and if it did not help to call back for hemorrhoid banding. Dr. Olevia Perches who would you like pt to see for banding in the office? Please advise.

## 2014-06-04 NOTE — Telephone Encounter (Signed)
Alexis Braun patient is still c/o pain and is interested in scheduling a banding .  Alexis Braun please review and advise

## 2014-06-04 NOTE — Telephone Encounter (Signed)
Pt is calling back about her procedure

## 2014-06-04 NOTE — Telephone Encounter (Signed)
Dr. Olevia Perches please advise if you want to set patient up for banding.  Will be a while with Dr. Carlean Purl

## 2014-06-04 NOTE — Telephone Encounter (Signed)
Sheri, Dr. Olevia Perches has referred to [facility] for banding but will wait back to hear ( I assume phone note was sent to [facility]?) If Olevia Perches wants Carlean Purl to do it then will need to forward to him. Thanks

## 2014-06-07 NOTE — Telephone Encounter (Signed)
Has been awaing a call back. Wants Dr Olevia Perches to know hemorroid medicine is not workig. Wanting to have this banding done prior to a 10 hour road trip she has planned on 06-27-14.

## 2014-06-08 NOTE — Telephone Encounter (Signed)
Dr Carlean Purl will band her on 06/09/14 @ 3:15 pm. Patient verbalizes understanding.

## 2014-06-08 NOTE — Telephone Encounter (Signed)
If she has banding  Before the trip ,she will likely have rectal pain for 1 week. The earliest I can do it is next week when I am a hospital doctor. I am not sure it is hemorrhoid vs a fissure. Unless Dr Carlean Purl can do it this week  I can do it next week as long as she know it may be sensitive to sit  For extended period of time while driving.

## 2014-06-09 ENCOUNTER — Encounter: Payer: Self-pay | Admitting: Internal Medicine

## 2014-06-09 ENCOUNTER — Ambulatory Visit (INDEPENDENT_AMBULATORY_CARE_PROVIDER_SITE_OTHER): Payer: BLUE CROSS/BLUE SHIELD | Admitting: Internal Medicine

## 2014-06-09 VITALS — BP 134/82 | HR 64 | Ht 64.5 in | Wt 192.5 lb

## 2014-06-09 DIAGNOSIS — K648 Other hemorrhoids: Secondary | ICD-10-CM

## 2014-06-09 NOTE — Patient Instructions (Addendum)
HEMORRHOID BANDING PROCEDURE    FOLLOW-UP CARE   1. The procedure you have had should have been relatively painless since the banding of the area involved does not have nerve endings and there is no pain sensation.  The rubber band cuts off the blood supply to the hemorrhoid and the band may fall off as soon as 48 hours after the banding (the band may occasionally be seen in the toilet bowl following a bowel movement). You may notice a temporary feeling of fullness in the rectum which should respond adequately to plain Tylenol or Motrin.  2. Following the banding, avoid strenuous exercise that evening and resume full activity the next day.  A sitz bath (soaking in a warm tub) or bidet is soothing, and can be useful for cleansing the area after bowel movements.     3. To avoid constipation, take two tablespoons of natural wheat bran, natural oat bran, flax, Benefiber or any over the counter fiber supplement and increase your water intake to 7-8 glasses daily.    4. Unless you have been prescribed anorectal medication, do not put anything inside your rectum for two weeks: No suppositories, enemas, fingers, etc.  5. Occasionally, you may have more bleeding than usual after the banding procedure.  This is often from the untreated hemorrhoids rather than the treated one.  Don't be concerned if there is a tablespoon or so of blood.  If there is more blood than this, lie flat with your bottom higher than your head and apply an ice pack to the area. If the bleeding does not stop within a half an hour or if you feel faint, call our office at [phone] or go to the emergency room.  6. Problems are not common; however, if there is a substantial amount of bleeding, severe pain, chills, fever or difficulty passing urine (very rare) or other problems, you should call us at [phone] or report to the nearest emergency room.  7. Do not stay seated continuously for more than 2-3 hours for a day or two  after the procedure.  Tighten your buttock muscles 10-15 times every two hours and take 10-15 deep breaths every 1-2 hours.  Do not spend more than a few minutes on the toilet if you cannot empty your bowel; instead re-visit the toilet at a later time.   Call us back in a week with an update.    I appreciate the opportunity to care for you. Silvano Rusk, M.D., [facility]

## 2014-06-11 DIAGNOSIS — K648 Other hemorrhoids: Secondary | ICD-10-CM | POA: Insufficient documentation

## 2014-06-11 NOTE — Progress Notes (Signed)
Patient ID: Alexis Braun, female   DOB: 1950-11-20, 64 y.o.   MRN: [id number]        The patient is anal burning and itching, mild fecal soiling and bleeding sxs.  2-3 stools/day No prolonged stooling, straining or constipation/diarrhea.  Patti Martinique, [place] present.  Anoscopy was performed with the patient in the left lateral decubitus position while a chaperone was present and revealed Gr 2 internal hemorrhoids RP and LL positions, Gr 1 RA.  PROCEDURE NOTE: The patient presents with symptomatic grade 1-2 hemorrhoids, requesting rubber band ligation of his/her hemorrhoidal disease.  All risks, benefits and alternative forms of therapy were described and informed consent was obtained.   The anorectum was pre-medicated with 0.125% NTG and 5% lidoacine The decision was made to band the RP and LL  internal hemorrhoids, and the Gove was used to perform band ligation without complication.  Digital anorectal examination was then performed to assure proper positioning of the band, and to adjust the banded tissue as required.  The patient was discharged home without pain or other issues.  Dietary and behavioral recommendations were given and along with follow-up instructions.     The following adjunctive treatments were recommended:  The patient will call in 1 week with an update to follow-up and consider  possible additional banding as required. No complications were encountered and the patient tolerated the procedure well.  I appreciate the opportunity to care for this patient. AL:PFXTK,WIOXB RUTH, MD

## 2014-06-11 NOTE — Assessment & Plan Note (Addendum)
LL and RP banded To call back with an update in 1 week - consider if needs additional banding RTC prn - headed to [place] in early June - will stay summer there

## 2014-06-22 ENCOUNTER — Ambulatory Visit
Admission: RE | Admit: 2014-06-22 | Discharge: 2014-06-22 | Disposition: A | Discharge status: Home / Self Care | Payer: BLUE CROSS/BLUE SHIELD | Source: Ambulatory Visit

## 2014-06-22 DIAGNOSIS — Z1231 Encounter for screening mammogram for malignant neoplasm of breast: Secondary | ICD-10-CM

## 2014-06-23 ENCOUNTER — Telehealth: Payer: Self-pay | Admitting: Internal Medicine

## 2014-06-23 NOTE — Telephone Encounter (Signed)
Spoke with patient and she is having quiet a bit of rectal discomfort. She had 2 hemorrhoids banded by Dr. Carlean Purl. She was suppose to go out of town until September. She has postponed her trip and will not be leaving until 07/05/14. She is asking if there is any way to have another banding prior to leaving town.

## 2014-06-24 NOTE — Telephone Encounter (Signed)
I left a message for the patient that the only appt time I could do was 06/28/14 at 11:15.  I did put her in that spot and asked that she call back to cancel if this won't work.

## 2014-06-28 ENCOUNTER — Encounter (INDEPENDENT_AMBULATORY_CARE_PROVIDER_SITE_OTHER): Payer: Self-pay

## 2014-06-28 ENCOUNTER — Ambulatory Visit (INDEPENDENT_AMBULATORY_CARE_PROVIDER_SITE_OTHER): Payer: BLUE CROSS/BLUE SHIELD | Admitting: Internal Medicine

## 2014-06-28 ENCOUNTER — Encounter: Payer: Self-pay | Admitting: Internal Medicine

## 2014-06-28 VITALS — BP 134/78 | HR 72 | Ht 64.5 in | Wt 192.4 lb

## 2014-06-28 DIAGNOSIS — K648 Other hemorrhoids: Secondary | ICD-10-CM | POA: Diagnosis not present

## 2014-06-28 NOTE — Patient Instructions (Addendum)
HEMORRHOID BANDING PROCEDURE    FOLLOW-UP CARE   1. The procedure you have had should have been relatively painless since the banding of the area involved does not have nerve endings and there is no pain sensation.  The rubber band cuts off the blood supply to the hemorrhoid and the band may fall off as soon as 48 hours after the banding (the band may occasionally be seen in the toilet bowl following a bowel movement). You may notice a temporary feeling of fullness in the rectum which should respond adequately to plain Tylenol or Motrin.  2. Following the banding, avoid strenuous exercise that evening and resume full activity the next day.  A sitz bath (soaking in a warm tub) or bidet is soothing, and can be useful for cleansing the area after bowel movements.     3. To avoid constipation, take two tablespoons of natural wheat bran, natural oat bran, flax, Benefiber or any over the counter fiber supplement and increase your water intake to 7-8 glasses daily.    4. Unless you have been prescribed anorectal medication, do not put anything inside your rectum for two weeks: No suppositories, enemas, fingers, etc.  5. Occasionally, you may have more bleeding than usual after the banding procedure.  This is often from the untreated hemorrhoids rather than the treated one.  Don't be concerned if there is a tablespoon or so of blood.  If there is more blood than this, lie flat with your bottom higher than your head and apply an ice pack to the area. If the bleeding does not stop within a half an hour or if you feel faint, call our office at [phone] or go to the emergency room.  6. Problems are not common; however, if there is a substantial amount of bleeding, severe pain, chills, fever or difficulty passing urine (very rare) or other problems, you should call us at [phone] or report to the nearest emergency room.  7. Do not stay seated continuously for more than 2-3 hours for a day or two  after the procedure.  Tighten your buttock muscles 10-15 times every two hours and take 10-15 deep breaths every 1-2 hours.  Do not spend more than a few minutes on the toilet if you cannot empty your bowel; instead re-visit the toilet at a later time.    Follow up with Dr Gessner as needed.    I appreciate the opportunity to care for you. Carl Gessner, MD, FACG 

## 2014-06-28 NOTE — Assessment & Plan Note (Signed)
RA banded RTC prn Continue psyllium

## 2014-06-30 NOTE — Progress Notes (Signed)
  Had RP and LL columns Gr 2 banded 05/2514 and noticed significantly less bleeding and anal itching +irritation. Has had slight recurrence. Desires further banding.  Rectal exam with female staff present - NL anoderm, nontender, no mass PROCEDURE NOTE: The patient presents with symptomatic grade 1-2   hemorrhoids, requesting rubber band ligation of his/her hemorrhoidal disease.  All risks, benefits and alternative forms of therapy were described and informed consent was obtained.   The anorectum was pre-medicated with 0.125% NTG and 5% lidocaine The decision was made to band the RA internal hemorrhoid, and the [place] was used to perform band ligation without complication.  Digital anorectal examination was then performed to assure proper positioning of the band, and to adjust the banded tissue as required.  The patient was discharged home without pain or other issues.  Dietary and behavioral recommendations were given and along with follow-up instructions.      The patient will return prn for  follow-up and possible additional banding as required. No complications were encountered and the patient tolerated the procedure well.   Hemorrhoids, internal, with bleeding RA banded RTC prn Continue psyllium     I appreciate the opportunity to care for you. RZ:NBVAP,OLIDC RUTH, MD

## 2014-07-13 ENCOUNTER — Encounter: Payer: BLUE CROSS/BLUE SHIELD | Admitting: Internal Medicine

## 2014-12-15 ENCOUNTER — Telehealth: Payer: Self-pay | Admitting: Internal Medicine

## 2014-12-15 NOTE — Telephone Encounter (Signed)
Left message for patient to call back  

## 2014-12-16 NOTE — Telephone Encounter (Signed)
Patient is having the same symptoms.  She will come for possible banding 01/17/14 3:15

## 2015-01-16 HISTORY — PX: HEMORRHOID BANDING: SHX5850

## 2015-01-21 ENCOUNTER — Encounter: Payer: BLUE CROSS/BLUE SHIELD | Admitting: Internal Medicine

## 2015-03-11 ENCOUNTER — Ambulatory Visit (INDEPENDENT_AMBULATORY_CARE_PROVIDER_SITE_OTHER): Payer: BLUE CROSS/BLUE SHIELD | Admitting: Internal Medicine

## 2015-03-11 ENCOUNTER — Encounter: Payer: Self-pay | Admitting: Internal Medicine

## 2015-03-11 VITALS — BP 120/78 | HR 64 | Ht 64.5 in | Wt 194.4 lb

## 2015-03-11 DIAGNOSIS — K648 Other hemorrhoids: Secondary | ICD-10-CM

## 2015-03-11 NOTE — Progress Notes (Signed)
   The patient has undergone hemorrhoidal banding last year. All 3 columns banded.  She had improvement but since then she's had a little bit of fecal urgency problems and she still having some fecal soiling and bleeding at times. She wanted to be reassessed and considered for treatment. PROCEDURE NOTE: The patient presents with symptomatic grade 2 hemorrhoids, requesting rubber band ligation of his/her hemorrhoidal disease.  All risks, benefits and alternative forms of therapy were described and informed consent was obtained.   The anorectum was pre-medicated with 0.125% NTg and 5% lidocaine In the Left Lateral Decubitus position anoscopic examination revealed grade 2 hemorrhoids in the RP and Grade 1 LL and RA  position(s).   The decision was made to band the RP internal hemorrhoid, and the [place] was used to perform band ligation without complication.  Digital anorectal examination was then performed to assure proper positioning of the band, and to adjust the banded tissue as required.  The patient was discharged home without pain or other issues.  Dietary and behavioral recommendations were given and along with follow-up instructions.       The patient will return PRN for  follow-up and possible additional banding as required. No complications were encountered and the patient tolerated the procedure well.

## 2015-03-11 NOTE — Patient Instructions (Signed)
HEMORRHOID BANDING PROCEDURE    FOLLOW-UP CARE   1. The procedure you have had should have been relatively painless since the banding of the area involved does not have nerve endings and there is no pain sensation.  The rubber band cuts off the blood supply to the hemorrhoid and the band may fall off as soon as 48 hours after the banding (the band may occasionally be seen in the toilet bowl following a bowel movement). You may notice a temporary feeling of fullness in the rectum which should respond adequately to plain Tylenol or Motrin.  2. Following the banding, avoid strenuous exercise that evening and resume full activity the next day.  A sitz bath (soaking in a warm tub) or bidet is soothing, and can be useful for cleansing the area after bowel movements.     3. To avoid constipation, take two tablespoons of natural wheat bran, natural oat bran, flax, Benefiber or any over the counter fiber supplement and increase your water intake to 7-8 glasses daily.    4. Unless you have been prescribed anorectal medication, do not put anything inside your rectum for two weeks: No suppositories, enemas, fingers, etc.  5. Occasionally, you may have more bleeding than usual after the banding procedure.  This is often from the untreated hemorrhoids rather than the treated one.  Don't be concerned if there is a tablespoon or so of blood.  If there is more blood than this, lie flat with your bottom higher than your head and apply an ice pack to the area. If the bleeding does not stop within a half an hour or if you feel faint, call our office at [phone] or go to the emergency room.  6. Problems are not common; however, if there is a substantial amount of bleeding, severe pain, chills, fever or difficulty passing urine (very rare) or other problems, you should call us at [phone] or report to the nearest emergency room.  7. Do not stay seated continuously for more than 2-3 hours for a day or two  after the procedure.  Tighten your buttock muscles 10-15 times every two hours and take 10-15 deep breaths every 1-2 hours.  Do not spend more than a few minutes on the toilet if you cannot empty your bowel; instead re-visit the toilet at a later time.    Follow up with Dr Gessner as needed.    I appreciate the opportunity to care for you. Carl Gessner, MD, FACG 

## 2015-06-10 ENCOUNTER — Other Ambulatory Visit: Payer: Self-pay

## 2015-06-10 DIAGNOSIS — Z1231 Encounter for screening mammogram for malignant neoplasm of breast: Secondary | ICD-10-CM

## 2015-06-17 ENCOUNTER — Ambulatory Visit (INDEPENDENT_AMBULATORY_CARE_PROVIDER_SITE_OTHER): Payer: BLUE CROSS/BLUE SHIELD | Admitting: Internal Medicine

## 2015-06-17 ENCOUNTER — Encounter: Payer: Self-pay | Admitting: Internal Medicine

## 2015-06-17 VITALS — BP 128/88 | HR 72 | Ht 64.5 in | Wt 198.2 lb

## 2015-06-17 DIAGNOSIS — R159 Full incontinence of feces: Secondary | ICD-10-CM | POA: Insufficient documentation

## 2015-06-17 DIAGNOSIS — K648 Other hemorrhoids: Secondary | ICD-10-CM | POA: Diagnosis not present

## 2015-06-17 DIAGNOSIS — L309 Dermatitis, unspecified: Secondary | ICD-10-CM | POA: Diagnosis not present

## 2015-06-17 NOTE — Patient Instructions (Signed)
   Have a nice summer in [place].   Try Calmoseptine or balneol , these are both over the counter.  Consider increasing your fiber intake.   I appreciate the opportunity to care for you. Silvano Rusk, MD, [facility]

## 2015-06-17 NOTE — Assessment & Plan Note (Signed)
Not bleeding now 

## 2015-06-17 NOTE — Progress Notes (Signed)
   Subjective:    Patient ID: Alexis Braun, female    DOB: 04-22-1950, 65 y.o.   MRN: [id number]  HPI Some intermittent mild post-defecatory fecal soiling and some perianal itching ?'s if needs more banding of hemrrhoids - last done 1 year ago - definitely better overall Going to [place] for summer again - "do what you need to before I go"  Medications, allergies, past medical history, past surgical history, family history and social history are reviewed and updated in the EMR  Review of Systems As above    Objective:   Physical Exam BP 128/88 mmHg  Pulse 72  Ht 5' 4.5" (1.638 m)  Wt 198 lb 4 oz (89.926 kg)  BMI 33.52 kg/m2  Patti Martinique, CMA present. Rectal - mild-mod perianal erythema NL resting tone No mass  Anoscopy was performed with the patient in the left lateral decubitus position while a chaperone was present and revealed Gr 1 non inflamed internal hemorrhoids     Assessment & Plan:   Encounter Diagnoses  Name Primary?  . Fecal soiling due to fecal incontinence Yes  . Perianal dermatitis   . Hemorrhoids, internal, with bleeding    Doubt more banding would help Try Calmoseptine or Balneol for the dermatitis Consider increasing fiber See me prn

## 2015-06-18 ENCOUNTER — Encounter: Payer: Self-pay | Admitting: Internal Medicine

## 2015-06-24 ENCOUNTER — Ambulatory Visit: Payer: BLUE CROSS/BLUE SHIELD

## 2015-06-24 ENCOUNTER — Ambulatory Visit
Admission: RE | Admit: 2015-06-24 | Discharge: 2015-06-24 | Disposition: A | Discharge status: Home / Self Care | Payer: BLUE CROSS/BLUE SHIELD | Source: Ambulatory Visit

## 2015-06-24 DIAGNOSIS — Z1231 Encounter for screening mammogram for malignant neoplasm of breast: Secondary | ICD-10-CM

## 2015-06-29 ENCOUNTER — Encounter: Payer: Self-pay | Admitting: Internal Medicine

## 2015-06-29 NOTE — Telephone Encounter (Signed)
A user error has taken place.

## 2015-10-05 ENCOUNTER — Encounter: Payer: Self-pay | Admitting: Cardiovascular Disease

## 2015-10-19 ENCOUNTER — Ambulatory Visit: Payer: BLUE CROSS/BLUE SHIELD | Admitting: Cardiovascular Disease

## 2016-04-17 ENCOUNTER — Encounter: Payer: Self-pay | Admitting: Cardiovascular Disease

## 2016-05-01 NOTE — Progress Notes (Signed)
Cardiology Office Note   Date:  05/02/2016   ID:  Alexis Braun, DOB 12-28-1950, MRN [id number]  PCP:  Marjorie Smolder, MD  Cardiologist:   Jenkins Rouge, MD   Chief Complaint  Patient presents with  . Chest Pain      History of Present Illness: Alexis Braun is a 66 y.o. female who presents for evaluation/consultation atypical chest pain. Referred by Dr [facility].  For a year substernal chest pressure mostly when she's anxious.  She has family stress Seeing therapist Normal lexiscan myovue in 2012 Also complains of some LE edema. Dyspnea walking up hills. Weight is up from lack of exercise. Given lasix for edema.   Reviewed my notes from 2013 did not want to be on statin history of hep C on Zetia Father had CABG in 70's Multiple normal functional studies 2005, 2006 2009 and normal cardiac CT 2007 left dominant calcium score 0  Labs reviewed:  Cr .72 K 4.3 normal LFTls  TC 227 LDL 137 Triglyceride 164 HDL 57   Symptoms clearly worse at rest and at night. She goes to spin class, swims and sculpting with no issues  Some stress dealing with family estate and siblings   Past Medical History:  Diagnosis Date  . Allergy    sulfa  . Anxiety   . Arthritis   . Chest pain   . Congenital hearing loss    right  . Gallbladder polyp 2014  . GERD (gastroesophageal reflux disease)   . Hepatitis    as teen-iv drugs  . Hypertension    on medication  . Internal hemorrhoid   . Migraines   . Ovarian cyst 04/03/1999  . PONV (postoperative nausea and vomiting)   . Positive hepatitis C antibody test    at age 35; has been told that she "self cured" No current or past treatment.  . Wears hearing aid    right    Past Surgical History:  Procedure Laterality Date  . CHOLECYSTECTOMY N/A 10/08/2012   Procedure: LAPAROSCOPIC CHOLECYSTECTOMY WITH INTRAOPERATIVE CHOLANGIOGRAM;  Surgeon: Imogene Burn. Georgette Dover, MD;  Location: [facility];  Service: General;  Laterality: N/A;  . CHONDROPLASTY  Right 03/31/2014   Procedure: CHONDROPLASTY;  Surgeon: Frederik Pear, MD;  Location: [facility] [facility];  Service: Orthopedics;  Laterality: Right;  . COLONOSCOPY     2007  . HEMORRHOID BANDING    . KNEE ARTHROSCOPY WITH LATERAL MENISECTOMY Right 03/31/2014   Procedure: KNEE ARTHROSCOPY WITH  PARTIAL LATERAL MENISECTOMY;  Surgeon: Frederik Pear, MD;  Location: [facility];  Service: Orthopedics;  Laterality: Right;  . KNEE ARTHROSCOPY WITH MEDIAL MENISECTOMY Right 03/31/2014   Procedure: KNEE ARTHROSCOPY WITH PARTIALMEDIAL MENISECTOMY;  Surgeon: Frederik Pear, MD;  Location: [facility];  Service: Orthopedics;  Laterality: Right;  . OOPHORECTOMY  2002   Right   . SIGMOIDOSCOPY     1999     Current Outpatient Prescriptions  Medication Sig Dispense Refill  . ALPRAZolam (XANAX) 0.5 MG tablet Take 0.5 mg by mouth at bedtime as needed for sleep.    Marland Kitchen aspirin EC 81 MG tablet Take 81 mg by mouth daily.    Marland Kitchen atenolol (TENORMIN) 25 MG tablet Take 1 tablet by mouth daily.    . cyclobenzaprine (FLEXERIL) 10 MG tablet Take 10 mg by mouth daily as needed for muscle spasms.    Marland Kitchen estradiol (ESTRACE) 0.1 MG/GM vaginal cream Place vaginally as directed.     . Flaxseed,  Linseed, (FLAX SEED OIL PO) Take 1 capsule by mouth daily.    Marland Kitchen ibuprofen (ADVIL,MOTRIN) 200 MG tablet Take 200-400 mg by mouth daily as needed for pain.     . Omega-3 Fatty Acids (FISH OIL) 1000 MG CAPS Take 1 capsule by mouth daily.     . SUMAtriptan (IMITREX) 100 MG tablet Take 100 mg by mouth daily as needed for migraine. May repeat in 2 hours if headache persists or recurs.    . traZODone (DESYREL) 50 MG tablet Take 50 mg by mouth at bedtime as needed for sleep.      No current facility-administered medications for this visit.     Allergies:   Sulfa antibiotics and Statins    Social History:  The patient  reports that she has never smoked. She has never used smokeless tobacco. She reports that she  drinks alcohol. She reports that she does not use drugs.   Family History:  The patient's family history includes Dementia in her mother; Heart attack in her father; Heart disease in her father and mother; Migraines in her mother.    ROS:  Please see the history of present illness.   Otherwise, review of systems are positive for none.   All other systems are reviewed and negative.    PHYSICAL EXAM: VS:  BP 130/70   Pulse 61   Ht 5' 4.5" (1.638 m)   Wt 176 lb 12.8 oz (80.2 kg)   SpO2 98%   BMI 29.88 kg/m  , BMI Body mass index is 29.88 kg/m. Affect appropriate Healthy:  appears stated age 8: normal Neck supple with no adenopathy JVP normal no bruits no thyromegaly Lungs clear with no wheezing and good diaphragmatic motion Heart:  S1/S2 no murmur, no rub, gallop or click PMI normal Abdomen: benighn, BS positve, no tenderness, no AAA no bruit.  No HSM or HJR Distal pulses intact with no bruits No edema Neuro non-focal Skin warm and dry No muscular weakness    ECG :    SR normal ECG 03/29/14  05/02/16  SR rate 56 normal   Recent Labs: No results found for requested labs within last 8760 hours.    Lipid Panel No results found for: CHOL, TRIG, HDL, CHOLHDL, VLDL, LDLCALC, LDLDIRECT    Wt Readings from Last 3 Encounters:  05/02/16 176 lb 12.8 oz (80.2 kg)  06/17/15 198 lb 4 oz (89.9 kg)  03/11/15 194 lb 6.4 oz (88.2 kg)      Other studies Reviewed: Additional studies/ records that were reviewed today include: Cardiology notes 2012/2013 Cardiac CT Office notes Dr Willy Eddy labs and ECG .    ASSESSMENT AND PLAN:  1.  Chest Pain atypical normal ECG f/u ETT 2. Cholesterol  On statin labs with primary  3. Palpitations  Benign sounding more at rest normal Exam f/u event monitor    Current medicines are reviewed at length with the patient today.  The patient does not have concerns regarding medicines.  The following changes have been made:  no change  Labs/  tests ordered today include: 21 Day event monitor, Echo , ETT   Orders Placed This Encounter  Procedures  . Cardiac event monitor  . EXERCISE TOLERANCE TEST  . EKG 12-Lead  . ECHOCARDIOGRAM COMPLETE     Disposition:   FU with me after testing done      Signed, Jenkins Rouge, MD  05/02/2016 4:47 PM    [address],  [place]  [address] Phone: [phone]; Fax: [phone]

## 2016-05-02 ENCOUNTER — Encounter (INDEPENDENT_AMBULATORY_CARE_PROVIDER_SITE_OTHER): Payer: Self-pay

## 2016-05-02 ENCOUNTER — Encounter: Payer: Self-pay | Admitting: Cardiovascular Disease

## 2016-05-02 ENCOUNTER — Ambulatory Visit (INDEPENDENT_AMBULATORY_CARE_PROVIDER_SITE_OTHER): Payer: BLUE CROSS/BLUE SHIELD | Admitting: Cardiovascular Disease

## 2016-05-02 VITALS — BP 130/70 | HR 61 | Ht 64.5 in | Wt 176.8 lb

## 2016-05-02 DIAGNOSIS — R002 Palpitations: Secondary | ICD-10-CM

## 2016-05-02 DIAGNOSIS — R0789 Other chest pain: Secondary | ICD-10-CM

## 2016-05-02 NOTE — Patient Instructions (Addendum)
Medication Instructions:  Your physician recommends that you continue on your current medications as directed. Please refer to the Current Medication list given to you today.  Labwork: NONE  Testing/Procedures: Your physician has requested that you have an echocardiogram. Echocardiography is a painless test that uses sound waves to create images of your heart. It provides your doctor with information about the size and shape of your heart and how well your heart's chambers and valves are working. This procedure takes approximately one hour. There are no restrictions for this procedure.  Your physician has recommended that you wear a 21 day event monitor. Holter monitors are medical devices that record the heart's electrical activity. Doctors most often use these monitors to diagnose arrhythmias. Arrhythmias are problems with the speed or rhythm of the heartbeat. The monitor is a small, portable device. You can wear one while you do your normal daily activities. This is usually used to diagnose what is causing palpitations/syncope (passing out).  Your physician has requested that you have an exercise tolerance test. For further information please visit [email or web address]. Please also follow instruction sheet, as given.  Follow-Up: Your physician wants you to follow-up with Dr. Johnsie Cancel after test are complete.( 1 to 2 weeks)   If you need a refill on your cardiac medications before your next appointment, please call your pharmacy.

## 2016-05-08 ENCOUNTER — Ambulatory Visit (INDEPENDENT_AMBULATORY_CARE_PROVIDER_SITE_OTHER): Payer: BLUE CROSS/BLUE SHIELD

## 2016-05-08 DIAGNOSIS — R002 Palpitations: Secondary | ICD-10-CM

## 2016-05-08 DIAGNOSIS — R0789 Other chest pain: Secondary | ICD-10-CM

## 2016-05-08 LAB — EXERCISE TOLERANCE TEST
CSEPED: 10 min
CSEPPHR: 139 {beats}/min
Estimated workload: 11.7 METS
Exercise duration (sec): 0 s
MPHR: 157 {beats}/min
Percent HR: 88 %
RPE: 15
Rest HR: 71 {beats}/min

## 2016-05-16 ENCOUNTER — Ambulatory Visit (HOSPITAL_COMMUNITY): Payer: BLUE CROSS/BLUE SHIELD | Attending: Cardiology

## 2016-05-16 ENCOUNTER — Other Ambulatory Visit: Payer: Self-pay

## 2016-05-16 DIAGNOSIS — R002 Palpitations: Secondary | ICD-10-CM

## 2016-05-16 DIAGNOSIS — R0789 Other chest pain: Secondary | ICD-10-CM | POA: Diagnosis not present

## 2016-05-16 DIAGNOSIS — I371 Nonrheumatic pulmonary valve insufficiency: Secondary | ICD-10-CM | POA: Diagnosis not present

## 2016-05-16 DIAGNOSIS — I340 Nonrheumatic mitral (valve) insufficiency: Secondary | ICD-10-CM | POA: Insufficient documentation

## 2016-05-28 ENCOUNTER — Ambulatory Visit: Payer: BLUE CROSS/BLUE SHIELD | Admitting: Cardiovascular Disease

## 2017-02-15 ENCOUNTER — Other Ambulatory Visit: Payer: Self-pay | Admitting: Obstetrics & Gynecology

## 2017-02-15 DIAGNOSIS — Z1231 Encounter for screening mammogram for malignant neoplasm of breast: Secondary | ICD-10-CM

## 2017-03-12 ENCOUNTER — Ambulatory Visit
Admission: RE | Admit: 2017-03-12 | Discharge: 2017-03-12 | Disposition: A | Discharge status: Home / Self Care | Payer: BLUE CROSS/BLUE SHIELD | Source: Ambulatory Visit | Attending: Obstetrics & Gynecology | Admitting: Obstetrics & Gynecology

## 2017-03-12 DIAGNOSIS — Z1231 Encounter for screening mammogram for malignant neoplasm of breast: Secondary | ICD-10-CM

## 2017-06-13 ENCOUNTER — Other Ambulatory Visit: Payer: Self-pay

## 2017-07-03 ENCOUNTER — Ambulatory Visit: Payer: BLUE CROSS/BLUE SHIELD | Admitting: Physician Assistant

## 2017-07-03 ENCOUNTER — Encounter: Payer: Self-pay | Admitting: Physician Assistant

## 2017-07-03 VITALS — BP 118/68 | HR 60 | Ht 64.5 in | Wt 171.0 lb

## 2017-07-03 DIAGNOSIS — R159 Full incontinence of feces: Secondary | ICD-10-CM | POA: Diagnosis not present

## 2017-07-03 DIAGNOSIS — K648 Other hemorrhoids: Secondary | ICD-10-CM

## 2017-07-03 DIAGNOSIS — R194 Change in bowel habit: Secondary | ICD-10-CM

## 2017-07-03 MED ORDER — HYDROCORTISONE ACETATE 25 MG RE SUPP: 25.0000 mg | Freq: Two times a day (BID) | RECTAL | 1 refills | Status: DC

## 2017-07-03 NOTE — Progress Notes (Addendum)
Chief Complaint: Hemorrhoids and rectal irritation  HPI:    Alexis Braun is a 68 year old female with a past medical history as listed below, follows with Dr. Carlean Purl and presents to clinic today with a complaint of hemorrhoids and rectal irritation.    03/16/2011 colonoscopy Dr. Olevia Perches with no polyps or cancers, repeat recommended in 10 years.    06/17/2015 office visit for intermittent mild post defecatory fecal soiling and some perianal itching.  At that time discussed that more banding would likely not help.  She was tried on Calmoseptine or Balneol for the dermatitis and was told to consider increasing fiber. At that time Grade I non-inflamed internal hemorrhoids.    Today, complains of continued fecal incontinence which is now happening even in her sleep.  Patient tells me that she "just cannot feel it pass".  This results in constant irritation around her rectum which she describes as a "diaper rash", sometimes this is bloody and she will see some bright red blood.  She has been putting some Calmoseptine cream on this which does help some but does not relieve the true problem.     Social History positive for going away for the summer.     Denies fever, chills, weight loss, anorexia, nausea, vomiting, abdominal or rectal pain.  Past Medical History:  Diagnosis Date  . Allergy    sulfa  . Anxiety   . Arthritis   . Chest pain   . Congenital hearing loss    right  . Gallbladder polyp 2014  . GERD (gastroesophageal reflux disease)   . Hepatitis    as teen-iv drugs  . Hypertension    on medication  . Internal hemorrhoid   . Migraines   . Ovarian cyst 04/03/1999  . PONV (postoperative nausea and vomiting)   . Positive hepatitis C antibody test    at age 23; has been told that she "self cured" No current or past treatment.  . Wears hearing aid    right    Past Surgical History:  Procedure Laterality Date  . CHOLECYSTECTOMY N/A 10/08/2012   Procedure: LAPAROSCOPIC CHOLECYSTECTOMY WITH  INTRAOPERATIVE CHOLANGIOGRAM;  Surgeon: Imogene Burn. Georgette Dover, MD;  Location: [facility];  Service: General;  Laterality: N/A;  . CHONDROPLASTY Right 03/31/2014   Procedure: CHONDROPLASTY;  Surgeon: Frederik Pear, MD;  Location: [facility];  Service: Orthopedics;  Laterality: Right;  . COLONOSCOPY     2007  . HEMORRHOID BANDING    . KNEE ARTHROSCOPY WITH LATERAL MENISECTOMY Right 03/31/2014   Procedure: KNEE ARTHROSCOPY WITH  PARTIAL LATERAL MENISECTOMY;  Surgeon: Frederik Pear, MD;  Location: [facility];  Service: Orthopedics;  Laterality: Right;  . KNEE ARTHROSCOPY WITH MEDIAL MENISECTOMY Right 03/31/2014   Procedure: KNEE ARTHROSCOPY WITH PARTIALMEDIAL MENISECTOMY;  Surgeon: Frederik Pear, MD;  Location: [facility];  Service: Orthopedics;  Laterality: Right;  . OOPHORECTOMY  2002   Right   . SIGMOIDOSCOPY     1999    Current Outpatient Medications  Medication Sig Dispense Refill  . ALPRAZolam (XANAX) 0.5 MG tablet Take 0.5 mg by mouth at bedtime as needed for sleep.    Marland Kitchen aspirin EC 81 MG tablet Take 81 mg by mouth daily.    Marland Kitchen atenolol (TENORMIN) 25 MG tablet Take 1 tablet by mouth daily.    . cyclobenzaprine (FLEXERIL) 10 MG tablet Take 10 mg by mouth daily as needed for muscle spasms.    Marland Kitchen estradiol (ESTRACE) 0.1 MG/GM vaginal cream Place vaginally  as directed.     . Flaxseed, Linseed, (FLAX SEED OIL PO) Take 1 capsule by mouth daily.    Marland Kitchen ibuprofen (ADVIL,MOTRIN) 200 MG tablet Take 200-400 mg by mouth daily as needed for pain.     . Omega-3 Fatty Acids (FISH OIL) 1000 MG CAPS Take 1 capsule by mouth daily.     . SUMAtriptan (IMITREX) 100 MG tablet Take 100 mg by mouth daily as needed for migraine. May repeat in 2 hours if headache persists or recurs.    . traZODone (DESYREL) 50 MG tablet Take 50 mg by mouth at bedtime as needed for sleep.      No current facility-administered medications for this visit.     Allergies as of 07/03/2017 - Review Complete  05/02/2016  Allergen Reaction Noted  . Sulfa antibiotics Anaphylaxis and Swelling 03/16/2011  . Statins Other (See Comments) 05/04/2010    Family History  Problem Relation Age of Onset  . Heart disease Father   . Heart attack Father   . Heart disease Mother   . Dementia Mother   . Migraines Mother   . Colon cancer Neg Hx   . Breast cancer Neg Hx     Social History   Socioeconomic History  . Marital status: Single    Spouse name: Not on file  . Number of children: 0  . Years of education: MA  . Highest education level: Not on file  Occupational History  . Occupation: Retired  Scientific laboratory technician  . Financial resource strain: Not on file  . Food insecurity:    Worry: Not on file    Inability: Not on file  . Transportation needs:    Medical: Not on file    Non-medical: Not on file  Tobacco Use  . Smoking status: Never Smoker  . Smokeless tobacco: Never Used  Substance and Sexual Activity  . Alcohol use: Yes    Comment: occ; twice monthly  . Drug use: No    Comment: as teen  . Sexual activity: Not on file  Lifestyle  . Physical activity:    Days per week: Not on file    Minutes per session: Not on file  . Stress: Not on file  Relationships  . Social connections:    Talks on phone: Not on file    Gets together: Not on file    Attends religious service: Not on file    Active member of club or organization: Not on file    Attends meetings of clubs or organizations: Not on file    Relationship status: Not on file  . Intimate partner violence:    Fear of current or ex partner: Not on file    Emotionally abused: Not on file    Physically abused: Not on file    Forced sexual activity: Not on file  Other Topics Concern  . Not on file  Social History Narrative   Patient lives at home alone.   occ caffeine; twice a week     Review of Systems:    Constitutional: No weight loss, fever or chills Cardiovascular: No chest pain Respiratory: No SOB Gastrointestinal: See  HPI and otherwise negative   Physical Exam:  Vital signs: BP 118/68   Pulse 60   Ht 5' 4.5" (1.638 m)   Wt 171 lb (77.6 kg)   BMI 28.90 kg/m    Constitutional:   Pleasant Caucasian female appears to be in NAD, Well developed, Well nourished, alert and cooperative Respiratory: Respirations even  and unlabored. Lungs clear to auscultation bilaterally.   No wheezes, crackles, or rhonchi.  Cardiovascular: Normal S1, S2. No MRG. Regular rate and rhythm. No peripheral edema, cyanosis or pallor.  Gastrointestinal:  Soft, nondistended, nontender. No rebound or guarding. Normal bowel sounds. No appreciable masses or hepatomegaly. Rectal:  External: peri-anal erythema; Internal: decreased sphincter tone, no discharge, no ttp, no mass; Anoscopy: Grade I inflamed hemorrhods Psychiatric:Demonstrates good judgement and reason without abnormal affect or behaviors.  No recent labs.  Assessment: 1. Internal Hemorrhoids: Seen today, consider relation to fecal incontinence and change in bowel habits, previous banding in 2016 2.  Fecal incontinence: Likely with above 3.  Change in bowel habits: Patient describes pencil thin stools at times, last colonoscopy 2013; consider relation to above versus polyp or mass  Plan: 1.  Recommend patient schedule for diagnostic colonoscopy with Dr. Carlean Purl in the [facility].  Did discuss risk, benefits, limitations and alternatives and the patient agrees to proceed. She will call us back when she knows her schedule and can pick a date. 2.  Recommend the patient continue her high-fiber diet. 3.  Prescribed Hydrocortisone suppositories twice daily x7 days with 1 refill. 4.  Explained to patient that pending results of colonoscopy would likely recommend she proceed with anal manometry for further evaluation.  From there we will discuss physical therapy with pelvic floor exercises versus surgery 5.  Patient to follow in clinic per recommendations from Dr. Carlean Purl after time of  procedure.  Alexis Newer, PA-C [facility] 07/03/2017, 10:26 AM  Cc: Darcus Austin, MD   Agree with Ms. Lemmon's evaluation and management.  She has not scheduled colonoscopy yet. We need to contact her and remind her/help her with this and also if still having perianal irritation would prescribe Mycolog ointment bid x 2 weeks # 30 g 1 refill apply to perianal skin  Gatha Mayer, MD, [facility]

## 2017-07-03 NOTE — Patient Instructions (Signed)
If you are age 65 or older, your body mass index should be between 23-30. Your Body mass index is 28.9 kg/m. If this is out of the aforementioned range listed, please consider follow up with your Primary Care Provider.  If you are age 49 or younger, your body mass index should be between 19-25. Your Body mass index is 28.9 kg/m. If this is out of the aformentioned range listed, please consider follow up with your Primary Care Provider.   We have sent the following medications to your pharmacy for you to pick up at your convenience: Hydrocortisone Suppositories  Patient to call back and schedule colonoscopy with Dr.Gessner.  Unable to schedule a date today.  Thank you for choosing me and [facility].   Ellouise Newer, PA-C

## 2017-07-22 ENCOUNTER — Telehealth: Payer: Self-pay

## 2017-07-22 NOTE — Telephone Encounter (Signed)
I spoke with the pt and she states she will call back to setup appt for October.  Now is not a good time to schedule any appts.  She will also call if her irritation to the perianal area returns

## 2017-07-22 NOTE — Telephone Encounter (Signed)
Agree with Ms. Alexis Braun evaluation and management.  She has not scheduled colonoscopy yet. We need to contact her and remind her/help her with this and also if still having perianal irritation would prescribe Mycolog ointment bid x 2 weeks # 30 g 1 refill apply to perianal skin  Gatha Mayer, MD, [facility]

## 2017-08-29 ENCOUNTER — Telehealth: Payer: Self-pay | Admitting: Physician Assistant

## 2017-08-29 NOTE — Telephone Encounter (Signed)
The pt would like to know if she can have an ENDO with her colon.  She has GERD symptoms that seem to be getting worse.  Please advise

## 2017-08-29 NOTE — Telephone Encounter (Signed)
Patient calling to schedule colon as discussed in appt on 6.19.2019 but also states she is having acid reflux really bad and wants to know if she can schedule and endo as well. Please advise.

## 2017-08-29 NOTE — Telephone Encounter (Signed)
This patient was seen by Anderson Malta and it does not appear that they discussed any reflux issues at her appointment.  Please check with the physician who is going to be performing the procedure and see if they are okay with adding it on.  May need to be rescheduled to a different date where there is a double slot if this is done.

## 2017-08-30 NOTE — Telephone Encounter (Signed)
Dr Gessner please advise? 

## 2017-09-02 ENCOUNTER — Telehealth: Payer: Self-pay

## 2017-09-02 NOTE — Telephone Encounter (Signed)
Left message on machine to call back  

## 2017-09-02 NOTE — Telephone Encounter (Signed)
Error voice mail full will try later

## 2017-09-02 NOTE — Telephone Encounter (Signed)
It is possible but I have a few ?  1) What are the sxs - heartburn? Pain? Dysphagia 2) Is she taking any medications - antacids, H2 blockers, PPI?

## 2017-09-02 NOTE — Telephone Encounter (Signed)
Called patient today regarding scheduling her colonoscopy with Dr. Carlean Purl since we have not heard back from her.  Her voicemail box was full.  Unable to leave a message.  A letter has been sent regarding contacting the office to set up colonoscopy.

## 2017-09-02 NOTE — Telephone Encounter (Signed)
-----   Message from Alexis Braun, [place] sent at 07/03/2017 11:05 AM EDT ----- Regarding: colonoscopy with Dr. Carlean Purl Pt will call back and schedule colonoscopy.  Unable to schedule a date at office visit.

## 2017-09-03 NOTE — Telephone Encounter (Signed)
I do not think that warrants an EGD  I suggest a reflux diet and taking OTC Zantac bid  Reasons to do an EGD are: Swallowing difficulty Unintentional weight loss Anemia/bleeding  As long as she does not have those just arrange the colonoscopy and I will review the GERD more at that appointment  Thanks

## 2017-09-03 NOTE — Telephone Encounter (Signed)
Line rings busy  

## 2017-09-03 NOTE — Telephone Encounter (Signed)
The pt states she takes OTC Zantac and Omeprazole occasionally.She experiences epigastric burning 3-4 times monthly.  Nothing seems to make it worse.  Taking zantac or omeprazole usually helps.

## 2017-09-04 NOTE — Telephone Encounter (Signed)
Left message on machine to call back  

## 2017-09-05 NOTE — Telephone Encounter (Signed)
Left message on machine to call back  

## 2017-09-06 NOTE — Telephone Encounter (Signed)
The pt has been advised and pre visit and colon scheduled.

## 2017-10-10 ENCOUNTER — Encounter: Payer: Self-pay | Admitting: Internal Medicine

## 2017-11-06 ENCOUNTER — Encounter: Payer: BLUE CROSS/BLUE SHIELD | Admitting: Internal Medicine

## 2017-11-13 ENCOUNTER — Encounter: Payer: Self-pay | Admitting: Internal Medicine

## 2017-11-15 ENCOUNTER — Ambulatory Visit (AMBULATORY_SURGERY_CENTER): Payer: Self-pay

## 2017-11-15 VITALS — Ht 64.5 in | Wt 164.8 lb

## 2017-11-15 DIAGNOSIS — R159 Full incontinence of feces: Secondary | ICD-10-CM

## 2017-11-15 NOTE — Progress Notes (Signed)
Per pt, no allergies to soy or egg products.Pt not taking any weight loss meds or using  O2 at home.  Pt refused emmi video. 

## 2017-11-22 ENCOUNTER — Encounter: Payer: BLUE CROSS/BLUE SHIELD | Admitting: Internal Medicine

## 2017-12-02 ENCOUNTER — Encounter: Payer: Self-pay | Admitting: Internal Medicine

## 2017-12-04 DIAGNOSIS — H903 Sensorineural hearing loss, bilateral: Secondary | ICD-10-CM | POA: Diagnosis not present

## 2017-12-16 ENCOUNTER — Ambulatory Visit (AMBULATORY_SURGERY_CENTER): Payer: Medicare Other | Admitting: Internal Medicine

## 2017-12-16 ENCOUNTER — Encounter: Payer: Self-pay | Admitting: Internal Medicine

## 2017-12-16 VITALS — BP 125/47 | HR 56 | Temp 96.0°F | Resp 19 | Ht 64.5 in | Wt 164.0 lb

## 2017-12-16 DIAGNOSIS — D125 Benign neoplasm of sigmoid colon: Secondary | ICD-10-CM | POA: Diagnosis not present

## 2017-12-16 DIAGNOSIS — N8110 Cystocele, unspecified: Secondary | ICD-10-CM | POA: Insufficient documentation

## 2017-12-16 DIAGNOSIS — D120 Benign neoplasm of cecum: Secondary | ICD-10-CM

## 2017-12-16 DIAGNOSIS — R159 Full incontinence of feces: Secondary | ICD-10-CM

## 2017-12-16 DIAGNOSIS — D122 Benign neoplasm of ascending colon: Secondary | ICD-10-CM

## 2017-12-16 DIAGNOSIS — R194 Change in bowel habit: Secondary | ICD-10-CM | POA: Diagnosis not present

## 2017-12-16 MED ORDER — SODIUM CHLORIDE 0.9 % IV SOLN: 500.0000 mL | Freq: Once | INTRAVENOUS | Status: DC

## 2017-12-16 NOTE — Op Note (Signed)
[facility] Patient Name: Alexis Braun Procedure Date: 12/16/2017 1:34 PM MRN: [id number] Endoscopist: Gatha Mayer , MD Age: 67 Referring MD:  Date of Birth: 01/29/1950 Gender: Female Account #: [id number] Procedure:                Colonoscopy Indications:              Change in bowel habits, Fecal incontinence Medicines:                Propofol per Anesthesia, Monitored Anesthesia Care Procedure:                Pre-Anesthesia Assessment:                           - Prior to the procedure, a History and Physical                            was performed, and patient medications and                            allergies were reviewed. The patient's tolerance of                            previous anesthesia was also reviewed. The risks                            and benefits of the procedure and the sedation                            options and risks were discussed with the patient.                            All questions were answered, and informed consent                            was obtained. Prior Anticoagulants: The patient has                            taken no previous anticoagulant or antiplatelet                            agents. ASA Grade Assessment: II - A patient with                            mild systemic disease. After reviewing the risks                            and benefits, the patient was deemed in                            satisfactory condition to undergo the procedure.                           After obtaining informed consent, the colonoscope  was passed under direct vision. Throughout the                            procedure, the patient's blood pressure, pulse, and                            oxygen saturations were monitored continuously. The                            Colonoscope was introduced through the anus and                            advanced to the the cecum, identified by   appendiceal orifice and ileocecal valve. The                            colonoscopy was performed without difficulty. The                            patient tolerated the procedure well. The quality                            of the bowel preparation was excellent. The                            ileocecal valve, appendiceal orifice, and rectum                            were photographed. The bowel preparation used was                            Miralax. Scope In: 1:42:04 PM Scope Out: 2:03:28 PM Scope Withdrawal Time: 0 hours 16 minutes 46 seconds  Total Procedure Duration: 0 hours 21 minutes 24 seconds  Findings:                 The perianal examination was normal.                           The digital rectal exam findings include decreased                            sphincter tone at rest and voluntary. No abnormal                            descent.                           A 5 mm polyp was found in the sigmoid colon. The                            polyp was sessile. The polyp was removed with a                            cold snare. Resection and retrieval were complete.  Verification of patient identification for the                            specimen was done. Estimated blood loss was minimal.                           Three sessile polyps were found in the ascending                            colon and cecum. The polyps were 1 to 2 mm in size.                            These polyps were removed with a cold biopsy                            forceps. Resection and retrieval were complete.                            Verification of patient identification for the                            specimen was done. Estimated blood loss was minimal.                           The exam was otherwise without abnormality on                            direct and retroflexion views. Complications:            No immediate complications. Estimated Blood Loss:      Estimated blood loss was minimal. Impression:               - Decreased sphincter tone at rest and voluntary.                            No abnormal descent. found on digital rectal exam.                           - One 5 mm polyp in the sigmoid colon, removed with                            a cold snare. Resected and retrieved.                           - Three 1 to 2 mm polyps in the ascending colon and                            in the cecum, removed with a cold biopsy forceps.                            Resected and retrieved.                           -  The examination was otherwise normal on direct                            and retroflexion views. Did see prior hemorrhoid                            banding scars in rectum.                           - Personal history of colonic polyps.                           I think fecal incontinence likely due to weak anal                            sphincter. She has bladder prolapse also which                            indicates pelvic floor weakness. ? if pelvic PT                            valuable in setting of a Pesary ? - not sure                            anorectal manometry will add anything new here -                            might. will discuss. Recommendation:           - Patient has a contact number available for                            emergencies. The signs and symptoms of potential                            delayed complications were discussed with the                            patient. Return to normal activities tomorrow.                            Written discharge instructions were provided to the                            patient.                           - Resume previous diet.                           - Continue present medications.                           - Repeat colonoscopy is recommended. The  colonoscopy date will be determined after pathology                             results from today's exam become available for                            review. Gatha Mayer, MD 12/16/2017 2:17:52 PM This report has been signed electronically.

## 2017-12-16 NOTE — Patient Instructions (Addendum)
I found and removed 4 tiny polyps.  I think your anal sphincter is weak and also suspect pelvic floor weakness since you have a Pesary to hold the bladder up.  We could do some testing of the sphincter called anorectal manometry ut I'm not sure what difference that would make. I want to make an inquiry about that and will get back to you.  Might make sense just to try pelvic floor physical therapy.  I will let you know pathology results and when to have another routine colonoscopy by mail and/or My Chart.  I appreciate the opportunity to care for you. Gatha Mayer, MD, FACG  YOU HAD AN ENDOSCOPIC PROCEDURE TODAY AT [facility]:   Refer to the procedure report that was given to you for any specific questions about what was found during the examination.  If the procedure report does not answer your questions, please call your gastroenterologist to clarify.  If you requested that your care partner not be given the details of your procedure findings, then the procedure report has been included in a sealed envelope for you to review at your convenience later.  YOU SHOULD EXPECT: Some feelings of bloating in the abdomen. Passage of more gas than usual.  Walking can help get rid of the air that was put into your GI tract during the procedure and reduce the bloating. If you had a lower endoscopy (such as a colonoscopy or flexible sigmoidoscopy) you may notice spotting of blood in your stool or on the toilet paper. If you underwent a bowel prep for your procedure, you may not have a normal bowel movement for a few days.  Please Note:  You might notice some irritation and congestion in your nose or some drainage.  This is from the oxygen used during your procedure.  There is no need for concern and it should clear up in a day or so.  SYMPTOMS TO REPORT IMMEDIATELY:   Following lower endoscopy (colonoscopy or flexible sigmoidoscopy):  Excessive amounts of blood in the  stool  Significant tenderness or worsening of abdominal pains  Swelling of the abdomen that is new, acute  Fever of 100F or higher  For urgent or emergent issues, a gastroenterologist can be reached at any hour by calling [phone].   DIET:  We do recommend a small meal at first, but then you may proceed to your regular diet.  Drink plenty of fluids but you should avoid alcoholic beverages for 24 hours.  ACTIVITY:  You should plan to take it easy for the rest of today and you should NOT DRIVE or use heavy machinery until tomorrow (because of the sedation medicines used during the test).    FOLLOW UP: Our staff will call the number listed on your records the next business day following your procedure to check on you and address any questions or concerns that you may have regarding the information given to you following your procedure. If we do not reach you, we will leave a message.  However, if you are feeling well and you are not experiencing any problems, there is no need to return our call.  We will assume that you have returned to your regular daily activities without incident.  If any biopsies were taken you will be contacted by phone or by letter within the next 1-3 weeks.  Please call us at [phone] if you have not heard about the biopsies in 3 weeks.    SIGNATURES/CONFIDENTIALITY:  You and/or your care partner have signed paperwork which will be entered into your electronic medical record.  These signatures attest to the fact that that the information above on your After Visit Summary has been reviewed and is understood.  Full responsibility of the confidentiality of this discharge information lies with you and/or your care-partner.

## 2017-12-16 NOTE — Progress Notes (Signed)
To recovery, report to RN, VSS. 

## 2017-12-16 NOTE — Progress Notes (Signed)
Called to room to assist during endoscopic procedure.  Patient ID and intended procedure confirmed with present staff. Received instructions for my participation in the procedure from the performing physician.  

## 2017-12-17 ENCOUNTER — Telehealth: Payer: Self-pay | Admitting: *Deleted

## 2017-12-17 NOTE — Telephone Encounter (Signed)
  Follow up Call-  Call back number 12/16/2017  Post procedure Call Back phone  # [phone]  Permission to leave phone message Yes  Some recent data might be hidden     Patient questions:  Do you have a fever, pain , or abdominal swelling? No. Pain Score  0 *  Have you tolerated food without any problems? Yes.    Have you been able to return to your normal activities? Yes.    Do you have any questions about your discharge instructions: Diet   No. Medications  No. Follow up visit  No.  Do you have questions or concerns about your Care? No.  Actions: * If pain score is 4 or above: No action needed, pain <4.

## 2017-12-24 ENCOUNTER — Encounter: Payer: Self-pay | Admitting: Internal Medicine

## 2017-12-24 DIAGNOSIS — Z86010 Personal history of colonic polyps: Secondary | ICD-10-CM

## 2017-12-24 DIAGNOSIS — Z860101 Personal history of adenomatous and serrated colon polyps: Secondary | ICD-10-CM

## 2017-12-24 HISTORY — DX: Personal history of colonic polyps: Z86.010

## 2017-12-24 HISTORY — DX: Personal history of adenomatous and serrated colon polyps: Z86.0101

## 2017-12-24 NOTE — Progress Notes (Signed)
[facility] - no letter 3 year recall (2022) colonoscopy  Office  Call her and explain that polyps benign but pre-cancerous so recall in 3 years  Ask her if she has gotten an appt to see Dr. Dellis Filbert of gynecology yet and tell her the more I think about her situation I think she may need a surgical treatment She can schedule an appointment to see me and discuss

## 2018-01-24 ENCOUNTER — Telehealth: Payer: Self-pay | Admitting: Internal Medicine

## 2018-01-24 NOTE — Telephone Encounter (Signed)
Left message for patient to call back  

## 2018-01-24 NOTE — Telephone Encounter (Signed)
Pt called in and advised she was told by Dr. Carlean Purl that she needs pt and when she reached out to a pt they informed her she will need a referral

## 2018-01-24 NOTE — Telephone Encounter (Signed)
I left a detailed  message for the patient

## 2018-01-24 NOTE — Telephone Encounter (Signed)
She was supposed to see GYN and ask about that  Was not sure how much it would help if she needs a Pessary to hold things up

## 2018-01-24 NOTE — Telephone Encounter (Signed)
Were you ordering PT.  Last note says you were going to look into PT in a patient with a pessary.  Please advise

## 2018-01-28 DIAGNOSIS — Z23 Encounter for immunization: Secondary | ICD-10-CM | POA: Diagnosis not present

## 2018-01-28 DIAGNOSIS — F419 Anxiety disorder, unspecified: Secondary | ICD-10-CM | POA: Diagnosis not present

## 2018-01-28 DIAGNOSIS — G4700 Insomnia, unspecified: Secondary | ICD-10-CM | POA: Diagnosis not present

## 2018-01-28 DIAGNOSIS — G43909 Migraine, unspecified, not intractable, without status migrainosus: Secondary | ICD-10-CM | POA: Diagnosis not present

## 2018-01-29 ENCOUNTER — Telehealth: Payer: Self-pay | Admitting: *Deleted

## 2018-01-29 NOTE — Telephone Encounter (Signed)
Patient is a transfer patient from [facility] has annual exam scheduled with you 03/26/17. She had colonoscopy done with Dr.Gessner and patient has pelvic floor issues, she has pessary. Per Dr. Carlean Purl note on 12/16/17 "I think your anal sphincter is weak and also suspect pelvic floor weakness since you have a Pesary to hold the bladder up."  He recommended Physical therapy for pelvic floor, but would like you to refer. This okay? Please advise

## 2018-01-31 NOTE — Telephone Encounter (Signed)
Agree with referral Pelvic Floor PT.

## 2018-02-03 NOTE — Telephone Encounter (Signed)
Patient has appointment on 02/10/18 at [facility] for Alexis Braun Physical therapist. I called and left message to call me.

## 2018-02-03 NOTE — Telephone Encounter (Signed)
Ileana Roup called back telling me to just fax order sheet with patient information attached, this was faxed to [phone]

## 2018-02-07 DIAGNOSIS — N951 Menopausal and female climacteric states: Secondary | ICD-10-CM | POA: Diagnosis not present

## 2018-02-07 DIAGNOSIS — Z72820 Sleep deprivation: Secondary | ICD-10-CM | POA: Diagnosis not present

## 2018-02-07 DIAGNOSIS — R232 Flushing: Secondary | ICD-10-CM | POA: Diagnosis not present

## 2018-02-07 DIAGNOSIS — N898 Other specified noninflammatory disorders of vagina: Secondary | ICD-10-CM | POA: Diagnosis not present

## 2018-02-10 DIAGNOSIS — N819 Female genital prolapse, unspecified: Secondary | ICD-10-CM | POA: Diagnosis not present

## 2018-02-10 DIAGNOSIS — R151 Fecal smearing: Secondary | ICD-10-CM | POA: Diagnosis not present

## 2018-02-10 DIAGNOSIS — M6281 Muscle weakness (generalized): Secondary | ICD-10-CM | POA: Diagnosis not present

## 2018-02-10 DIAGNOSIS — R152 Fecal urgency: Secondary | ICD-10-CM | POA: Diagnosis not present

## 2018-02-11 DIAGNOSIS — R232 Flushing: Secondary | ICD-10-CM | POA: Diagnosis not present

## 2018-02-11 DIAGNOSIS — N898 Other specified noninflammatory disorders of vagina: Secondary | ICD-10-CM | POA: Diagnosis not present

## 2018-02-11 DIAGNOSIS — Z72820 Sleep deprivation: Secondary | ICD-10-CM | POA: Diagnosis not present

## 2018-02-11 DIAGNOSIS — N951 Menopausal and female climacteric states: Secondary | ICD-10-CM | POA: Diagnosis not present

## 2018-02-17 DIAGNOSIS — M6289 Other specified disorders of muscle: Secondary | ICD-10-CM | POA: Diagnosis not present

## 2018-02-17 DIAGNOSIS — M6281 Muscle weakness (generalized): Secondary | ICD-10-CM | POA: Diagnosis not present

## 2018-02-17 DIAGNOSIS — R151 Fecal smearing: Secondary | ICD-10-CM | POA: Diagnosis not present

## 2018-02-17 DIAGNOSIS — M62838 Other muscle spasm: Secondary | ICD-10-CM | POA: Diagnosis not present

## 2018-02-24 DIAGNOSIS — M62838 Other muscle spasm: Secondary | ICD-10-CM | POA: Diagnosis not present

## 2018-02-24 DIAGNOSIS — M6281 Muscle weakness (generalized): Secondary | ICD-10-CM | POA: Diagnosis not present

## 2018-02-24 DIAGNOSIS — R151 Fecal smearing: Secondary | ICD-10-CM | POA: Diagnosis not present

## 2018-02-24 DIAGNOSIS — M6289 Other specified disorders of muscle: Secondary | ICD-10-CM | POA: Diagnosis not present

## 2018-02-25 DIAGNOSIS — D23122 Other benign neoplasm of skin of left lower eyelid, including canthus: Secondary | ICD-10-CM | POA: Diagnosis not present

## 2018-02-25 DIAGNOSIS — D485 Neoplasm of uncertain behavior of skin: Secondary | ICD-10-CM | POA: Diagnosis not present

## 2018-02-25 DIAGNOSIS — H02831 Dermatochalasis of right upper eyelid: Secondary | ICD-10-CM | POA: Diagnosis not present

## 2018-02-25 DIAGNOSIS — H02834 Dermatochalasis of left upper eyelid: Secondary | ICD-10-CM | POA: Diagnosis not present

## 2018-03-05 ENCOUNTER — Encounter: Payer: Self-pay | Admitting: Obstetrics & Gynecology

## 2018-03-11 DIAGNOSIS — M6289 Other specified disorders of muscle: Secondary | ICD-10-CM | POA: Diagnosis not present

## 2018-03-11 DIAGNOSIS — M6281 Muscle weakness (generalized): Secondary | ICD-10-CM | POA: Diagnosis not present

## 2018-03-11 DIAGNOSIS — R151 Fecal smearing: Secondary | ICD-10-CM | POA: Diagnosis not present

## 2018-03-11 DIAGNOSIS — M62838 Other muscle spasm: Secondary | ICD-10-CM | POA: Diagnosis not present

## 2018-03-24 DIAGNOSIS — F4322 Adjustment disorder with anxiety: Secondary | ICD-10-CM | POA: Diagnosis not present

## 2018-03-27 ENCOUNTER — Other Ambulatory Visit: Payer: Self-pay

## 2018-03-27 ENCOUNTER — Ambulatory Visit: Payer: Medicare Other | Admitting: Obstetrics & Gynecology

## 2018-03-27 ENCOUNTER — Encounter: Payer: Self-pay | Admitting: Obstetrics & Gynecology

## 2018-03-27 VITALS — BP 130/70 | Ht 64.5 in | Wt 172.0 lb

## 2018-03-27 DIAGNOSIS — N8110 Cystocele, unspecified: Secondary | ICD-10-CM | POA: Diagnosis not present

## 2018-03-27 DIAGNOSIS — Z13820 Encounter for screening for osteoporosis: Secondary | ICD-10-CM

## 2018-03-27 DIAGNOSIS — Z780 Asymptomatic menopausal state: Secondary | ICD-10-CM | POA: Diagnosis not present

## 2018-03-27 DIAGNOSIS — Z01419 Encounter for gynecological examination (general) (routine) without abnormal findings: Secondary | ICD-10-CM

## 2018-03-27 DIAGNOSIS — R5383 Other fatigue: Secondary | ICD-10-CM

## 2018-03-27 DIAGNOSIS — N952 Postmenopausal atrophic vaginitis: Secondary | ICD-10-CM

## 2018-03-27 MED ORDER — ESTRADIOL 0.1 MG/GM VA CREA: 0.2500 | TOPICAL_CREAM | VAGINAL | 3 refills | Status: DC

## 2018-03-27 NOTE — Progress Notes (Signed)
Alexis Braun 04/21/1950 [id number]   History:    68 y.o. G2P0A2 Single  RP:  Established patient presenting for annual gyn exam   HPI: H/O Rt Oophorectomy.  Menopause on no HRT.  No PMB.  In the last year, feels a decrease in her energy level, not feeling very well.  More hair loss.  Changes in stools.  Abstinent.  Uses Estrace cream twice a week. Cystocele, sometimes using pessary.  Mild SUI.  Doing PT for Pelvic floor.  Breasts normal.  BMI 29.07.  Enjoys swimming.  Health labs with Fam MD.  Past medical history,surgical history, family history and social history were all reviewed and documented in the EPIC chart.  Gynecologic History No LMP recorded. Patient is postmenopausal. Contraception: abstinence and post menopausal status Last Pap: 2015. Results were: normal Last mammogram: 02/2017. Results were: Negative Bone Density: Will schedule here now Colonoscopy: 2019  Obstetric History OB History  Gravida Para Term Preterm AB Living  2       2    SAB TAB Ectopic Multiple Live Births               # Outcome Date GA Lbr Len/2nd Weight Sex Delivery Anes PTL Lv  2 AB           1 AB              ROS: A ROS was performed and pertinent positives and negatives are included in the history.  GENERAL: No fevers or chills. HEENT: No change in vision, no earache, sore throat or sinus congestion. NECK: No pain or stiffness. CARDIOVASCULAR: No chest pain or pressure. No palpitations. PULMONARY: No shortness of breath, cough or wheeze. GASTROINTESTINAL: No abdominal pain, nausea, vomiting or diarrhea, melena or bright red blood per rectum. GENITOURINARY: No urinary frequency, urgency, hesitancy or dysuria. MUSCULOSKELETAL: No joint or muscle pain, no back pain, no recent trauma. DERMATOLOGIC: No rash, no itching, no lesions. ENDOCRINE: No polyuria, polydipsia, no heat or cold intolerance. No recent change in weight. HEMATOLOGICAL: No anemia or easy bruising or bleeding. NEUROLOGIC: No  headache, seizures, numbness, tingling or weakness. PSYCHIATRIC: No depression, no loss of interest in normal activity or change in sleep pattern.     Exam:   BP 136/84   Ht 5' 4.5" (1.638 m)   Wt 172 lb (78 kg)   BMI 29.07 kg/m   Body mass index is 29.07 kg/m.  General appearance : Well developed well nourished female. No acute distress HEENT: Eyes: no retinal hemorrhage or exudates,  Neck supple, trachea midline, no carotid bruits, no thyroidmegaly Lungs: Clear to auscultation, no rhonchi or wheezes, or rib retractions  Heart: Regular rate and rhythm, no murmurs or gallops Breast:Examined in sitting and supine position were symmetrical in appearance, no palpable masses or tenderness,  no skin retraction, no nipple inversion, no nipple discharge, no skin discoloration, no axillary or supraclavicular lymphadenopathy Abdomen: no palpable masses or tenderness, no rebound or guarding Extremities: no edema or skin discoloration or tenderness  Pelvic: Vulva: Normal             Vagina: No gross lesions or discharge  Cervix: No gross lesions or discharge.  Pap reflex done  Uterus  AV, normal size, shape and consistency, non-tender and mobile  Adnexa  Without masses or tenderness  Anus: Normal    Assessment/Plan:  68 y.o. female for annual exam   1. Encounter for routine gynecological examination with Papanicolaou smear of cervix Gynecologic exam  in menopause with a grade 2/4 cystocele.  Pap reflex done.  Breast exam normal.  Last screening mammogram February 2019 was negative, will schedule screening mammogram now.  Health labs with family physician.  Colonoscopy in 2019.  Body mass index 29.07.  Continue with fitness.  2. Postmenopausal Well on no hormone replacement therapy.  No postmenopausal bleeding.  3. Postmenopausal atrophic vaginitis No contraindication to continue with Estrace cream.  Re-prescription sent to pharmacy.  4. Screening for osteoporosis Schedule bone density  here now.  Vitamin D supplements, calcium intake of 1.5 g/day and regular weightbearing physical activity is recommended. - DG Bone Density; Future  5. Baden-Walker grade 2 cystocele Continue with PT and pessary as needed.  Avoid pelvic pressure.  6. Low energy Rule out hypothyroidism with a TSH.  Patient was considering testosterone supplement, will verify levels today. - Testos,Total,Free and SHBG (Female) - TSH  Other orders - estradiol (ESTRACE) 0.1 MG/GM vaginal cream; Place 7.61 Applicatorfuls vaginally 2 (two) times a week.  Counseling on above issues and coordination of care more than 50% for 10 minutes.  Princess Bruins MD, 4:23 PM 03/27/2018

## 2018-03-28 DIAGNOSIS — Z01419 Encounter for gynecological examination (general) (routine) without abnormal findings: Secondary | ICD-10-CM | POA: Diagnosis not present

## 2018-03-30 ENCOUNTER — Encounter: Payer: Self-pay | Admitting: Obstetrics & Gynecology

## 2018-03-30 NOTE — Patient Instructions (Signed)
1. Encounter for routine gynecological examination with Papanicolaou smear of cervix Gynecologic exam in menopause with a grade 2/4 cystocele.  Pap reflex done.  Breast exam normal.  Last screening mammogram February 2019 was negative, will schedule screening mammogram now.  Health labs with family physician.  Colonoscopy in 2019.  Body mass index 29.07.  Continue with fitness.  2. Postmenopausal Well on no hormone replacement therapy.  No postmenopausal bleeding.  3. Postmenopausal atrophic vaginitis No contraindication to continue with Estrace cream.  Re-prescription sent to pharmacy.  4. Screening for osteoporosis Schedule bone density here now.  Vitamin D supplements, calcium intake of 1.5 g/day and regular weightbearing physical activity is recommended. - DG Bone Density; Future  5. Baden-Walker grade 2 cystocele Continue with PT and pessary as needed.  Avoid pelvic pressure.  6. Low energy Rule out hypothyroidism with a TSH.  Patient was considering testosterone supplement, will verify levels today. - Testos,Total,Free and SHBG (Female) - TSH  Other orders - estradiol (ESTRACE) 0.1 MG/GM vaginal cream; Place 4.68 Applicatorfuls vaginally 2 (two) times a week.  Alexis Braun, it was a pleasure seeing you today!  I will inform you of your results as soon as they are available.

## 2018-03-31 LAB — TESTOS,TOTAL,FREE AND SHBG (FEMALE)
Free Testosterone: 1.3 pg/mL (ref 0.1–6.4)
Sex Hormone Binding: 59 nmol/L (ref 14–73)
Testosterone, Total, LC-MS-MS: 16 ng/dL (ref 2–45)

## 2018-03-31 LAB — PAP IG W/ RFLX HPV ASCU

## 2018-03-31 LAB — TSH: TSH: 2.58 mIU/L (ref 0.40–4.50)

## 2018-04-01 DIAGNOSIS — G4733 Obstructive sleep apnea (adult) (pediatric): Secondary | ICD-10-CM | POA: Diagnosis not present

## 2018-04-07 DIAGNOSIS — F4322 Adjustment disorder with anxiety: Secondary | ICD-10-CM | POA: Diagnosis not present

## 2018-04-21 DIAGNOSIS — F4322 Adjustment disorder with anxiety: Secondary | ICD-10-CM | POA: Diagnosis not present

## 2018-05-02 DIAGNOSIS — F4322 Adjustment disorder with anxiety: Secondary | ICD-10-CM | POA: Diagnosis not present

## 2018-05-16 DIAGNOSIS — F4322 Adjustment disorder with anxiety: Secondary | ICD-10-CM | POA: Diagnosis not present

## 2018-05-26 DIAGNOSIS — H524 Presbyopia: Secondary | ICD-10-CM | POA: Diagnosis not present

## 2018-06-02 ENCOUNTER — Other Ambulatory Visit: Payer: Self-pay | Admitting: Obstetrics & Gynecology

## 2018-06-02 ENCOUNTER — Other Ambulatory Visit: Payer: Self-pay

## 2018-06-02 ENCOUNTER — Ambulatory Visit
Admission: RE | Admit: 2018-06-02 | Discharge: 2018-06-02 | Disposition: A | Discharge status: Home / Self Care | Payer: Medicare Other | Source: Ambulatory Visit | Attending: Obstetrics & Gynecology | Admitting: Obstetrics & Gynecology

## 2018-06-02 DIAGNOSIS — Z1231 Encounter for screening mammogram for malignant neoplasm of breast: Secondary | ICD-10-CM | POA: Diagnosis not present

## 2018-07-04 ENCOUNTER — Telehealth: Payer: Self-pay | Admitting: *Deleted

## 2018-07-04 NOTE — Telephone Encounter (Signed)
Patient called requesting new Rx for Valtrex for HSV-2 states she takes 1 pill daily. Patient said you prescribed years ago.  Please advise

## 2018-07-04 NOTE — Telephone Encounter (Signed)
Agree with Valacyclovir 500 mg daily  #90, refill x 3.

## 2018-07-07 MED ORDER — VALACYCLOVIR HCL 500 MG PO TABS: 500.0000 mg | ORAL_TABLET | Freq: Every day | ORAL | 3 refills | Status: DC

## 2018-07-07 MED ORDER — VALACYCLOVIR HCL 500 MG PO TABS: 500.0000 mg | ORAL_TABLET | Freq: Two times a day (BID) | ORAL | 3 refills | Status: DC

## 2018-07-07 NOTE — Telephone Encounter (Signed)
Rx sent 

## 2018-07-16 DIAGNOSIS — R5383 Other fatigue: Secondary | ICD-10-CM | POA: Diagnosis not present

## 2018-07-16 DIAGNOSIS — E559 Vitamin D deficiency, unspecified: Secondary | ICD-10-CM | POA: Diagnosis not present

## 2018-07-16 DIAGNOSIS — E039 Hypothyroidism, unspecified: Secondary | ICD-10-CM | POA: Diagnosis not present

## 2018-07-16 DIAGNOSIS — D509 Iron deficiency anemia, unspecified: Secondary | ICD-10-CM | POA: Diagnosis not present

## 2018-07-16 DIAGNOSIS — D518 Other vitamin B12 deficiency anemias: Secondary | ICD-10-CM | POA: Diagnosis not present

## 2018-07-16 DIAGNOSIS — D688 Other specified coagulation defects: Secondary | ICD-10-CM | POA: Diagnosis not present

## 2018-07-16 DIAGNOSIS — D649 Anemia, unspecified: Secondary | ICD-10-CM | POA: Diagnosis not present

## 2018-07-30 DIAGNOSIS — F4322 Adjustment disorder with anxiety: Secondary | ICD-10-CM | POA: Diagnosis not present

## 2018-09-17 DIAGNOSIS — F4322 Adjustment disorder with anxiety: Secondary | ICD-10-CM | POA: Diagnosis not present

## 2018-10-17 DIAGNOSIS — F4322 Adjustment disorder with anxiety: Secondary | ICD-10-CM | POA: Diagnosis not present

## 2018-10-22 ENCOUNTER — Telehealth: Payer: Self-pay

## 2018-10-22 MED ORDER — VALACYCLOVIR HCL 500 MG PO TABS: 500.0000 mg | ORAL_TABLET | Freq: Every day | ORAL | 3 refills | Status: AC

## 2018-10-22 MED ORDER — ACYCLOVIR 5 % EX OINT: 1.0000 "application " | TOPICAL_OINTMENT | CUTANEOUS | 2 refills | Status: DC

## 2018-10-22 NOTE — Telephone Encounter (Signed)
Patient is with HSV outbreak.  Rx was sent for her in June 2020 but sent to out of state pharmacy and local here does not have record.  She also would like Acyclovir ointment to have on hand.    I reordered the Valtrex to local pharmacy and Rx sent for Acyclovir ointment.

## 2018-10-23 ENCOUNTER — Telehealth: Payer: Self-pay | Admitting: *Deleted

## 2018-10-23 NOTE — Telephone Encounter (Signed)
Pharmacy sent fax stating Acyclovir 5% ointment is not covered by insurance. I called and explained this to patient and patein said she will pay out of pocket for medication.

## 2018-10-28 DIAGNOSIS — G4700 Insomnia, unspecified: Secondary | ICD-10-CM | POA: Diagnosis not present

## 2018-10-28 DIAGNOSIS — Z23 Encounter for immunization: Secondary | ICD-10-CM | POA: Diagnosis not present

## 2018-10-28 DIAGNOSIS — Z6828 Body mass index (BMI) 28.0-28.9, adult: Secondary | ICD-10-CM | POA: Diagnosis not present

## 2018-10-28 DIAGNOSIS — G43909 Migraine, unspecified, not intractable, without status migrainosus: Secondary | ICD-10-CM | POA: Diagnosis not present

## 2018-10-31 ENCOUNTER — Other Ambulatory Visit: Payer: Self-pay | Admitting: Family Medicine

## 2018-10-31 DIAGNOSIS — E049 Nontoxic goiter, unspecified: Secondary | ICD-10-CM

## 2018-11-14 DIAGNOSIS — F4322 Adjustment disorder with anxiety: Secondary | ICD-10-CM | POA: Diagnosis not present

## 2018-12-15 ENCOUNTER — Ambulatory Visit
Admission: RE | Admit: 2018-12-15 | Discharge: 2018-12-15 | Disposition: A | Discharge status: Home / Self Care | Payer: Medicare Other | Source: Ambulatory Visit | Attending: Family Medicine | Admitting: Family Medicine

## 2018-12-15 DIAGNOSIS — E042 Nontoxic multinodular goiter: Secondary | ICD-10-CM | POA: Diagnosis not present

## 2018-12-15 DIAGNOSIS — E049 Nontoxic goiter, unspecified: Secondary | ICD-10-CM

## 2018-12-17 DIAGNOSIS — L821 Other seborrheic keratosis: Secondary | ICD-10-CM | POA: Diagnosis not present

## 2018-12-17 DIAGNOSIS — L720 Epidermal cyst: Secondary | ICD-10-CM | POA: Diagnosis not present

## 2018-12-17 DIAGNOSIS — L298 Other pruritus: Secondary | ICD-10-CM | POA: Diagnosis not present

## 2018-12-24 DIAGNOSIS — G4733 Obstructive sleep apnea (adult) (pediatric): Secondary | ICD-10-CM | POA: Diagnosis not present

## 2018-12-25 DIAGNOSIS — D2261 Melanocytic nevi of right upper limb, including shoulder: Secondary | ICD-10-CM | POA: Diagnosis not present

## 2018-12-25 DIAGNOSIS — Z85828 Personal history of other malignant neoplasm of skin: Secondary | ICD-10-CM | POA: Diagnosis not present

## 2018-12-25 DIAGNOSIS — D1801 Hemangioma of skin and subcutaneous tissue: Secondary | ICD-10-CM | POA: Diagnosis not present

## 2018-12-25 DIAGNOSIS — D225 Melanocytic nevi of trunk: Secondary | ICD-10-CM | POA: Diagnosis not present

## 2019-02-22 ENCOUNTER — Ambulatory Visit: Payer: Medicare Other

## 2019-02-26 ENCOUNTER — Ambulatory Visit: Payer: Medicare Other

## 2019-03-12 ENCOUNTER — Ambulatory Visit: Payer: Medicare Other

## 2019-04-01 ENCOUNTER — Other Ambulatory Visit: Payer: Self-pay

## 2019-04-02 ENCOUNTER — Ambulatory Visit: Payer: Medicare Other | Admitting: Obstetrics & Gynecology

## 2019-04-02 ENCOUNTER — Encounter: Payer: Self-pay | Admitting: Obstetrics & Gynecology

## 2019-04-02 VITALS — BP 132/80

## 2019-04-02 DIAGNOSIS — R1030 Lower abdominal pain, unspecified: Secondary | ICD-10-CM

## 2019-04-02 DIAGNOSIS — Z780 Asymptomatic menopausal state: Secondary | ICD-10-CM | POA: Diagnosis not present

## 2019-04-02 DIAGNOSIS — N904 Leukoplakia of vulva: Secondary | ICD-10-CM

## 2019-04-02 MED ORDER — CLOBETASOL PROPIONATE 0.05 % EX OINT: 1.0000 "application " | TOPICAL_OINTMENT | CUTANEOUS | 4 refills | Status: DC

## 2019-04-02 NOTE — Progress Notes (Signed)
    Alexis Braun [name] [birth year] [id number]        69 y.o.  G2P0020 Single  RP: Intermittent lower abdominal discomfort x a few months  HPI: Intermittent lower abdominal discomfort.  Postmenopausal well on no HRT.  No PMB.  H/O Rt Oophorectomy.  Sexually active, no pain with IC except at anterior vulva with stretching of the skin.  Also feels pain with removing her pessary.  Lichen Sclerosus of the vulva, needs a represcription of Clobetasol.  Using Estrogen cream.  Recent Colonoscopy with removal of 4 precancerous Polyps.  Borderline Hypothyroidism.  Thyroid US showed benign looking nodules.  OB History  Gravida Para Term Preterm AB Living  2       2    SAB TAB Ectopic Multiple Live Births               # Outcome Date GA Lbr Len/2nd Weight Sex Delivery Anes PTL Lv  2 AB           1 AB             Past medical history,surgical history, problem list, medications, allergies, family history and social history were all reviewed and documented in the EPIC chart.   Directed ROS with pertinent positives and negatives documented in the history of present illness/assessment and plan.  Exam:  Vitals:   04/02/19 1001  BP: 132/80   General appearance:  Normal  Abdomen: Normal  Gynecologic exam: Pelvic: Vulva: White atrophy of Lichen Sclerosus, especially anteriorly.             Vagina: No gross lesions or discharge.  Cystocele grade 2/4             Cervix: No gross lesions or discharge.              Uterus  AV, normal size, shape and consistency, non-tender and mobile             Adnexa  Without masses or tenderness             Anus: Normal    Assessment/Plan:  69 y.o. G2P0020   1. Lower abdominal pain Lower abdominal pain with normal gynecologic exam except for stable cystocele grade 2/4.  Unlikely to be gynecologic but will further investigate with a pelvic ultrasound.  Possibly GI in origin.  Will verify relationship of pain and discomfort with different foods. - US Transvaginal  Non-OB; Future  2. Postmenopausal Well on no hormone replacement therapy except for estrogen cream.  We will continue the same.  3. Lichen sclerosus et atrophicus of the vulva Lichen sclerosus, needs a represcription of clobetasol ointment.  Usage known.  Prescription sent to pharmacy.  Other orders - clobetasol ointment (TEMOVATE) 0.05 %; Apply 1 application topically 2 (two) times a week. Thin application on affected vulva.  Princess Bruins MD, 10:26 AM 04/02/2019

## 2019-04-05 ENCOUNTER — Emergency Department (HOSPITAL_COMMUNITY)
Admission: EM | Admit: 2019-04-05 | Discharge: 2019-04-05 | Disposition: A | Discharge status: Home / Self Care | Payer: Medicare Other | Attending: Emergency Medicine | Admitting: Emergency Medicine

## 2019-04-05 ENCOUNTER — Encounter (HOSPITAL_COMMUNITY): Payer: Self-pay

## 2019-04-05 ENCOUNTER — Emergency Department (HOSPITAL_COMMUNITY): Payer: Medicare Other

## 2019-04-05 ENCOUNTER — Other Ambulatory Visit: Payer: Self-pay

## 2019-04-05 DIAGNOSIS — Z7982 Long term (current) use of aspirin: Secondary | ICD-10-CM | POA: Insufficient documentation

## 2019-04-05 DIAGNOSIS — Z9049 Acquired absence of other specified parts of digestive tract: Secondary | ICD-10-CM | POA: Insufficient documentation

## 2019-04-05 DIAGNOSIS — R079 Chest pain, unspecified: Secondary | ICD-10-CM | POA: Diagnosis not present

## 2019-04-05 DIAGNOSIS — Z79899 Other long term (current) drug therapy: Secondary | ICD-10-CM | POA: Diagnosis not present

## 2019-04-05 DIAGNOSIS — R0789 Other chest pain: Secondary | ICD-10-CM | POA: Diagnosis not present

## 2019-04-05 DIAGNOSIS — I10 Essential (primary) hypertension: Secondary | ICD-10-CM | POA: Diagnosis not present

## 2019-04-05 LAB — BASIC METABOLIC PANEL
Anion gap: 9 (ref 5–15)
BUN: 23 mg/dL (ref 8–23)
CO2: 24 mmol/L (ref 22–32)
Calcium: 9.5 mg/dL (ref 8.9–10.3)
Chloride: 104 mmol/L (ref 98–111)
Creatinine, Ser: 0.68 mg/dL (ref 0.44–1.00)
GFR calc Af Amer: >60 mL/min (ref >60)
GFR calc non Af Amer: >60 mL/min (ref >60)
Glucose, Bld: 103 mg/dL — ABNORMAL HIGH (ref 70–99)
Potassium: 4.3 mmol/L (ref 3.5–5.1)
Sodium: 137 mmol/L (ref 135–145)

## 2019-04-05 LAB — CBC
HCT: 36.1 % (ref 36.0–46.0)
Hemoglobin: 12.5 g/dL (ref 12.0–15.0)
MCH: 31.8 pg (ref 26.0–34.0)
MCHC: 34.6 g/dL (ref 30.0–36.0)
MCV: 91.9 fL (ref 80.0–100.0)
Platelets: 279 10*3/uL (ref 150–400)
RBC: 3.93 MIL/uL (ref 3.87–5.11)
RDW: 11.6 % (ref 11.5–15.5)
WBC: 5.3 10*3/uL (ref 4.0–10.5)
nRBC: 0 % (ref 0.0–0.2)

## 2019-04-05 LAB — TROPONIN I (HIGH SENSITIVITY)
Troponin I (High Sensitivity): <2 ng/L (ref <18)
Troponin I (High Sensitivity): 2 ng/L (ref <18)

## 2019-04-05 MED ORDER — SODIUM CHLORIDE 0.9% FLUSH
3.0000 mL | Freq: Once | INTRAVENOUS | Status: AC
  Administered 2019-04-05: 3 mL via INTRAVENOUS

## 2019-04-05 NOTE — ED Provider Notes (Signed)
[place] DEPT Provider Note   CSN: [id number] Arrival date & time: 04/05/19  1211     History Chief Complaint  Patient presents with  . Chest Pain    Alexis Braun is a 69 y.o. female.  HPI She presents for evaluation of chest pain.  Today, she had 30 minutes of pain in her mid chest, "to the right of the sternum and radiating to my right jaw," which resolved spontaneously.  She describes having "recurring," pain like this for a year.  She does not have a shortness of breath with the episodes of chest pain.  The last episode was 1 month ago.  She exercises regularly, swimming, without pain.  The pains have always been at rest.  No prior cardiac diagnosis or evaluation by cardiology.  She is not a smoker.  She has previously been obese but lost 40 pounds, with exercising and diet.  She denies fever, cough, nausea, vomiting, headache or back pain.    Past Medical History:  Diagnosis Date  . Allergy    sulfa  . Anxiety   . Arthritis   . Bladder prolapse, female, acquired   . Chest pain    per pt, over couple years  . Congenital hearing loss    right  . Gallbladder polyp 2014  . GERD (gastroesophageal reflux disease)   . Hepatitis    as teen-iv drugs  . Hx of adenomatous and sessile serrated colonic polyps 12/24/2017  . Hypertension    on medication  . Internal hemorrhoid   . Migraines   . Ovarian cyst 04/03/1999  . PONV (postoperative nausea and vomiting)   . Positive hepatitis C antibody test    at age 89; has been told that she "self cured" No current or past treatment.  . Sleep apnea    uses c-pap  . Wears hearing aid    right    Patient Active Problem List   Diagnosis Date Noted  . Hx of adenomatous and sessile serrated colonic polyps 12/24/2017  . Bladder prolapse, female, acquired   . Fecal soiling due to fecal incontinence 06/17/2015  . Perianal dermatitis 06/17/2015  . Hemorrhoids, internal, with bleeding 06/11/2014  .  Internal hemorrhoids 05/21/2014  . Gallbladder polyp 09/30/2012  . Migraine 08/12/2012  . GERD (gastroesophageal reflux disease) 01/30/2012  . Mixed hyperlipidemia 05/01/2011  . Chest pain 05/04/2010    Past Surgical History:  Procedure Laterality Date  . CHOLECYSTECTOMY N/A 10/08/2012   Procedure: LAPAROSCOPIC CHOLECYSTECTOMY WITH INTRAOPERATIVE CHOLANGIOGRAM;  Surgeon: Imogene Burn. Georgette Dover, MD;  Location: [place];  Service: General;  Laterality: N/A;  . CHONDROPLASTY Right 03/31/2014   Procedure: CHONDROPLASTY;  Surgeon: Frederik Pear, MD;  Location: [place];  Service: Orthopedics;  Laterality: Right;  . COLONOSCOPY     2007  . HEMORRHOID BANDING  2017  . KNEE ARTHROSCOPY WITH LATERAL MENISECTOMY Right 03/31/2014   Procedure: KNEE ARTHROSCOPY WITH  PARTIAL LATERAL MENISECTOMY;  Surgeon: Frederik Pear, MD;  Location: [place];  Service: Orthopedics;  Laterality: Right;  . KNEE ARTHROSCOPY WITH MEDIAL MENISECTOMY Right 03/31/2014   Procedure: KNEE ARTHROSCOPY WITH PARTIALMEDIAL MENISECTOMY;  Surgeon: Frederik Pear, MD;  Location: [place];  Service: Orthopedics;  Laterality: Right;  . OOPHORECTOMY  2002   Right   . SIGMOIDOSCOPY     1999     OB History    Gravida  2   Para      Term  Preterm      AB  2   Living        SAB      TAB      Ectopic      Multiple      Live Births              Family History  Problem Relation Age of Onset  . Heart disease Father   . Heart attack Father   . Heart disease Mother   . Dementia Mother   . Migraines Mother   . Colon cancer Neg Hx   . Breast cancer Neg Hx   . Esophageal cancer Neg Hx   . Stomach cancer Neg Hx   . Rectal cancer Neg Hx     Social History   Tobacco Use  . Smoking status: Never Smoker  . Smokeless tobacco: Never Used  Substance Use Topics  . Alcohol use: Yes    Comment: occ; twice monthly  . Drug use: No    Comment: as teen    Home  Medications Prior to Admission medications   Medication Sig Start Date End Date Taking? Authorizing Provider  ALPRAZolam Duanne Moron) 0.5 MG tablet Take 0.5 mg by mouth at bedtime as needed for sleep.   Yes [provider]  aspirin EC 81 MG tablet Take 81 mg by mouth daily.   Yes [provider]  atenolol (TENORMIN) 25 MG tablet Take 12.5 mg by mouth daily.  05/02/14  Yes [provider]  cyclobenzaprine (FLEXERIL) 10 MG tablet Take 10 mg by mouth 3 (three) times daily as needed for muscle spasms. 02/23/19  Yes [provider]  estradiol (ESTRACE) 0.1 MG/GM vaginal cream Place [id number] Applicatorfuls vaginally 2 (two) times a week. Patient taking differently: Place [id number] Applicatorfuls vaginally daily.  03/27/18  Yes Princess Bruins, MD  Flaxseed, Linseed, (FLAX SEED OIL PO) Take 1 capsule by mouth daily.   Yes [provider]  ibuprofen (ADVIL,MOTRIN) 200 MG tablet Take 200-400 mg by mouth daily as needed for pain.    Yes [provider]  Omega-3 Fatty Acids (FISH OIL) 1000 MG CAPS Take 1 capsule by mouth daily.    Yes [provider]  SUMAtriptan (IMITREX) 100 MG tablet Take 100 mg by mouth daily as needed for migraine. May repeat in 2 hours if headache persists or recurs.   Yes [provider]  traZODone (DESYREL) 50 MG tablet Take 50 mg by mouth at bedtime as needed for sleep.    Yes [provider]  valACYclovir (VALTREX) 500 MG tablet Take 1 tablet (500 mg total) by mouth daily. 10/22/18  Yes Princess Bruins, MD  acyclovir ointment (ZOVIRAX) 5 % Apply 1 application topically every 3 (three) hours. Patient not taking: Reported on 04/05/2019 10/22/18   Princess Bruins, MD  clobetasol ointment (TEMOVATE) [id number] % Apply 1 application topically 2 (two) times a week. Thin application on affected vulva. Patient not taking: Reported on 04/05/2019 04/02/19   Princess Bruins, MD  hydrocortisone (ANUSOL-HC) 25 MG suppository Place  1 suppository (25 mg total) rectally 2 (two) times daily. Use for 7 days Patient not taking: Reported on 04/05/2019 07/03/17   Levin Erp, PA    Allergies    Sulfa antibiotics and Statins  Review of Systems   Review of Systems  All other systems reviewed and are negative.   Physical Exam Updated Vital Signs BP 118/65 (BP Location: Right Arm)   Pulse (!) 50   Temp 98 F (  36.7 C) (Oral)   Resp (!) 7   SpO2 98%   Physical Exam Vitals and nursing note reviewed.  Constitutional:      General: She is not in acute distress.    Appearance: She is well-developed. She is not ill-appearing, toxic-appearing or diaphoretic.  HENT:     Head: Normocephalic and atraumatic.     Right Ear: External ear normal.     Left Ear: External ear normal.     Nose: No congestion or rhinorrhea.  Eyes:     Conjunctiva/sclera: Conjunctivae normal.     Pupils: Pupils are equal, round, and reactive to light.  Neck:     Trachea: Phonation normal.  Cardiovascular:     Rate and Rhythm: Normal rate and regular rhythm.     Heart sounds: Normal heart sounds.  Pulmonary:     Effort: Pulmonary effort is normal. No respiratory distress.     Breath sounds: Normal breath sounds. No stridor.  Chest:     Chest wall: No tenderness.  Abdominal:     General: There is no distension.     Palpations: Abdomen is soft.     Tenderness: There is no abdominal tenderness.  Musculoskeletal:        General: Normal range of motion.     Cervical back: Normal range of motion and neck supple.  Skin:    General: Skin is warm and dry.  Neurological:     Mental Status: She is alert and oriented to person, place, and time.     Cranial Nerves: No cranial nerve deficit.     Sensory: No sensory deficit.     Motor: No abnormal muscle tone.     Coordination: Coordination normal.  Psychiatric:        Mood and Affect: Mood normal.        Behavior: Behavior normal.        Thought Content: Thought content normal.         Judgment: Judgment normal.     ED Results / Procedures / Treatments   Labs (all labs ordered are listed, but only abnormal results are displayed) Labs Reviewed  BASIC METABOLIC PANEL - Abnormal; Notable for the following components:      Result Value   Glucose, Bld 103 (*)    All other components within normal limits  CBC  TROPONIN I (HIGH SENSITIVITY)  TROPONIN I (HIGH SENSITIVITY)    EKG EKG Interpretation  Date/Time:  Sunday April 05 2019 12:18:52 EDT Ventricular Rate:  59 PR Interval:    QRS Duration: 96 QT Interval:  389 QTC Calculation: 386 R Axis:   24 Text Interpretation: Sinus rhythm Since last tracing T wave abnormality resolved Otherwise no significant change Confirmed by Daleen Bo [phone]) on 04/05/2019 12:38:44 PM   Radiology DG Chest 2 View  Result Date: 04/05/2019 CLINICAL DATA:  Chest pain. Additional history provided: Patient reports chest pain that goes through to her back and radiates up into jaw for the past year. EXAM: CHEST - 2 VIEW COMPARISON:  Chest radiograph 10/28/2018 FINDINGS: Heart size within normal limits. Aortic atherosclerosis. There is no airspace consolidation within the lungs. No evidence of pleural effusion or pneumothorax. No acute bony abnormality.  Thoracic spondylosis. IMPRESSION: No evidence of acute cardiopulmonary abnormality. Electronically Signed   By: Kellie Simmering DO   On: 04/05/2019 14:57    Procedures Procedures (including critical care time)  Medications Ordered in ED Medications  sodium chloride flush (NS) 0.9 % injection 3 mL (3  mLs Intravenous Given 04/05/19 1244)    ED Course  I have reviewed the triage vital signs and the nursing notes.  Pertinent labs & imaging results that were available during my care of the patient were reviewed by me and considered in my medical decision making (see chart for details).  Clinical Course as of Apr 05 1650  Sun Apr 05, 2019  1451 Normal  Troponin I (High Sensitivity) [EW]    1451 Normal  Basic metabolic panel(!) [EW]  [id number] Normal  CBC [EW]  1451 No infiltrate or CHF, interpreted by me  DG Chest 2 View [EW]  1603 Normal delta troponin  Troponin I (High Sensitivity) [EW]    Clinical Course User Index [EW] Daleen Bo, MD   MDM Rules/Calculators/A&P                       Patient Vitals for the past 24 hrs:  BP Temp Temp src Pulse Resp SpO2  04/05/19 1400 118/65 -- -- (!) 50 (!) 7 98 %  04/05/19 1330 113/77 -- -- (!) 53 12 98 %  04/05/19 1300 127/71 -- -- (!) 56 12 97 %  04/05/19 1243 (!) 125/47 -- -- (!) 57 13 98 %  04/05/19 1220 113/66 98 F (36.7 C) Oral 60 18 100 %    4:52 PM Reevaluation with update and discussion. After initial assessment and treatment, an updated evaluation reveals she remains comfortable and pain-free.  Findings discussed and questions answered. Daleen Bo   Medical Decision Making: Patient with low risk chest pain, not typical of coronary event.  Doubt ischemia or infarct.  Doubt ACS.  She states she has had a cholesterol test in the past, and it was normal.  Her family members, parents, had cardiac disorders, at advanced age.  Mitsy Burkey Ksiazek was evaluated in Emergency Department on 04/05/2019 for the symptoms described in the history of present illness. She was evaluated in the context of the global COVID-19 pandemic, which necessitated consideration that the patient might be at risk for infection with the SARS-CoV-2 virus that causes COVID-19. Institutional protocols and algorithms that pertain to the evaluation of patients at risk for COVID-19 are in a state of rapid change based on information released by regulatory bodies including the CDC and federal and state organizations. These policies and algorithms were followed during the patient's care in the ED.   CRITICAL CARE-no Performed by: Daleen Bo   Nursing Notes Reviewed/ Care Coordinated Applicable Imaging Reviewed Interpretation of Laboratory Data  incorporated into ED treatment  The patient appears reasonably screened and/or stabilized for discharge and I doubt any other medical condition or other [facility] requiring further screening, evaluation, or treatment in the ED at this time prior to discharge.  Plan: Home Medications-usual; Home Treatments-courage advance diet and activity; return here if the recommended treatment, does not improve the symptoms; Recommended follow up-PCP follow-up for further evaluation and treatment.    Final Clinical Impression(s) / ED Diagnoses Final diagnoses:  Nonspecific chest pain    Rx / DC Orders ED Discharge Orders    None       Daleen Bo, MD 04/05/19 1652

## 2019-04-05 NOTE — ED Triage Notes (Addendum)
Pt presents with c/o chest pain in the center of her chest for approx 40 minutes. Pt reports that she has had this pain off and on for approx one year but that the pain does normally subside. Pt reports that the pain feels like pressure in nature. Pt reports the pain goes through to her back and jaw as well.

## 2019-04-05 NOTE — Discharge Instructions (Addendum)
The testing today, was reassuring.  There does not appear to be anything wrong with your heart at this time.  Since you are low risk for cardiac disease it is safe to go home.  Please follow-up with your primary care doctor for further evaluation and treatment.  She may choose to refer you for specialty evaluation, as well.

## 2019-04-05 NOTE — ED Notes (Signed)
Pt to Xray.

## 2019-04-08 DIAGNOSIS — I700 Atherosclerosis of aorta: Secondary | ICD-10-CM | POA: Diagnosis not present

## 2019-04-08 DIAGNOSIS — E042 Nontoxic multinodular goiter: Secondary | ICD-10-CM | POA: Diagnosis not present

## 2019-04-08 DIAGNOSIS — L820 Inflamed seborrheic keratosis: Secondary | ICD-10-CM | POA: Diagnosis not present

## 2019-04-08 DIAGNOSIS — G4733 Obstructive sleep apnea (adult) (pediatric): Secondary | ICD-10-CM | POA: Diagnosis not present

## 2019-04-08 DIAGNOSIS — L570 Actinic keratosis: Secondary | ICD-10-CM | POA: Diagnosis not present

## 2019-04-08 DIAGNOSIS — R079 Chest pain, unspecified: Secondary | ICD-10-CM | POA: Diagnosis not present

## 2019-04-09 ENCOUNTER — Ambulatory Visit: Payer: Medicare Other | Admitting: Gastroenterology

## 2019-04-09 ENCOUNTER — Encounter: Payer: Self-pay | Admitting: Gastroenterology

## 2019-04-09 VITALS — BP 102/70 | HR 68 | Temp 97.9°F | Ht 64.0 in | Wt 177.0 lb

## 2019-04-09 DIAGNOSIS — R140 Abdominal distension (gaseous): Secondary | ICD-10-CM | POA: Diagnosis not present

## 2019-04-09 DIAGNOSIS — R1032 Left lower quadrant pain: Secondary | ICD-10-CM

## 2019-04-09 NOTE — Patient Instructions (Addendum)
If you are age 67 or older, your body mass index should be between 23-30. Your Body mass index is 30.38 kg/m. If this is out of the aforementioned range listed, please consider follow up with your Primary Care Provider.  If you are age 30 or younger, your body mass index should be between 19-25. Your Body mass index is 30.38 kg/m. If this is out of the aformentioned range listed, please consider follow up with your Primary Care Provider.   You have been scheduled for a CT scan of the abdomen and pelvis at [facility] ([address]---this is in the same building as [employer]).   Increase psyllium husk to twice daily.   You are scheduled on Tuesday 04/14/19 at 11:30 am. You should arrive 15 minutes prior to your appointment time for registration. Please follow the written instructions below on the day of your exam:  WARNING: IF YOU ARE ALLERGIC TO IODINE/X-RAY DYE, PLEASE NOTIFY RADIOLOGY IMMEDIATELY AT [phone]! YOU WILL BE GIVEN A 13 HOUR PREMEDICATION PREP.  1) Do not eat or drink anything after 7:30 am (4 hours prior to your test) 2) You have been given 2 bottles of oral contrast to drink. The solution may taste better if refrigerated, but do NOT add ice or any other liquid to this solution. Shake well before drinking.    Drink 1 bottle of contrast @ 9:30 am (2 hours prior to your exam)  Drink 1 bottle of contrast @ 10:30 am (1 hour prior to your exam)  You may take any medications as prescribed with a small amount of water, if necessary. If you take any of the following medications: METFORMIN, GLUCOPHAGE, GLUCOVANCE, AVANDAMET, RIOMET, FORTAMET, [place] MET, JANUMET, GLUMETZA or METAGLIP, you MAY be asked to HOLD this medication 48 hours AFTER the exam.  The purpose of you drinking the oral contrast is to aid in the visualization of your intestinal tract. The contrast solution may cause some diarrhea. Depending on your individual set of symptoms, you may  also receive an intravenous injection of x-ray contrast/dye. Plan on being at [facility] for 30 minutes or longer, depending on the type of exam you are having performed.  This test typically takes 30-45 minutes to complete.  If you have any questions regarding your exam or if you need to reschedule, you may call the CT department at [phone] between the hours of 8:00 am and 5:00 pm, Monday-Friday.  _______________________________________________________________

## 2019-04-09 NOTE — Progress Notes (Signed)
04/09/2019 Carleene Clar [id number] 10/25/1950   HISTORY OF PRESENT ILLNESS: This is a 69 year old female is a patient of Dr. Celesta Aver.  Last colonoscopy December 2019 showed the following:  - Decreased sphincter tone at rest and voluntary. No abnormal descent. found on digital rectal exam. - One 5 mm polyp in the sigmoid colon, removed with a cold snare. Resected and retrieved. - Three 1 to 2 mm polyps in the ascending colon and in the cecum, removed with a cold biopsy forceps. Resected and retrieved. - The examination was otherwise normal on direct and retroflexion views. Did see prior hemorrhoid banding scars in rectum. - Personal history of colonic polyps. I think fecal incontinence likely due to weak anal sphincter. She has bladder prolapse also which indicates pelvic floor weakness. ? if pelvic PT valuable in setting of a Pesary ? - not sure anorectal manometry will add anything new here - might. will discuss.  She is here today with complaints of left-sided abdominal pain with generalized abdominal bloating and gassiness.  She describes stabbing and cramping type pain in the left side of her abdomen before a bowel movement.  Denies seeing blood in her stools.  Overall she feels that she moves her bowels regularly.  Uses psyllium husk daily.    Past Medical History:  Diagnosis Date  . Allergy    sulfa  . Anxiety   . Arthritis   . Bladder prolapse, female, acquired   . Chest pain    per pt, over couple years  . Congenital hearing loss    right  . Gallbladder polyp 2014  . GERD (gastroesophageal reflux disease)   . Hepatitis    as teen-iv drugs  . Hx of adenomatous and sessile serrated colonic polyps 12/24/2017  . Hypertension    on medication  . Internal hemorrhoid   . Migraines   . Ovarian cyst 04/03/1999  . PONV (postoperative nausea and vomiting)   . Positive hepatitis C antibody test    at age 59; has been told that she "self cured" No current or past  treatment.  . Sleep apnea    uses c-pap  . Wears hearing aid    right   Past Surgical History:  Procedure Laterality Date  . CHOLECYSTECTOMY N/A 10/08/2012   Procedure: LAPAROSCOPIC CHOLECYSTECTOMY WITH INTRAOPERATIVE CHOLANGIOGRAM;  Surgeon: Imogene Burn. Georgette Dover, MD;  Location: [facility];  Service: General;  Laterality: N/A;  . CHONDROPLASTY Right 03/31/2014   Procedure: CHONDROPLASTY;  Surgeon: Frederik Pear, MD;  Location: [facility];  Service: Orthopedics;  Laterality: Right;  . COLONOSCOPY     2007  . HEMORRHOID BANDING  2017  . KNEE ARTHROSCOPY WITH LATERAL MENISECTOMY Right 03/31/2014   Procedure: KNEE ARTHROSCOPY WITH  PARTIAL LATERAL MENISECTOMY;  Surgeon: Frederik Pear, MD;  Location: [facility];  Service: Orthopedics;  Laterality: Right;  . KNEE ARTHROSCOPY WITH MEDIAL MENISECTOMY Right 03/31/2014   Procedure: KNEE ARTHROSCOPY WITH PARTIALMEDIAL MENISECTOMY;  Surgeon: Frederik Pear, MD;  Location: [facility];  Service: Orthopedics;  Laterality: Right;  . OOPHORECTOMY  2002   Right   . SIGMOIDOSCOPY     1999    reports that she has never smoked. She has never used smokeless tobacco. She reports current alcohol use. She reports that she does not use drugs. family history includes Dementia in her mother; Heart attack in her father; Heart disease in her father and mother; Migraines in her mother. Allergies  Allergen Reactions  .  Sulfa Antibiotics Anaphylaxis and Swelling    Nose & throat swells  . Statins Other (See Comments)    History of Hepatitis C Patient will not take them      Outpatient Encounter Medications as of 04/09/2019  Medication Sig  . ALPRAZolam (XANAX) 0.5 MG tablet Take 0.5 mg by mouth at bedtime as needed for sleep.  Marland Kitchen aspirin EC 81 MG tablet Take 81 mg by mouth daily.  Marland Kitchen atenolol (TENORMIN) 25 MG tablet Take 12.5 mg by mouth daily.   . cyclobenzaprine (FLEXERIL) 10 MG tablet Take 10 mg by mouth 3 (three) times daily as  needed for muscle spasms.  Marland Kitchen estradiol (ESTRACE) 0.1 MG/GM vaginal cream Place [id number] Applicatorfuls vaginally 2 (two) times a week. (Patient taking differently: Place [id number] Applicatorfuls vaginally daily. )  . Flaxseed, Linseed, (FLAX SEED OIL PO) Take 1 capsule by mouth daily.  . hydrocortisone (ANUSOL-HC) 25 MG suppository Place 1 suppository (25 mg total) rectally 2 (two) times daily. Use for 7 days  . ibuprofen (ADVIL,MOTRIN) 200 MG tablet Take 200-400 mg by mouth daily as needed for pain.   . Omega-3 Fatty Acids (FISH OIL) 1000 MG CAPS Take 1 capsule by mouth daily.   . SUMAtriptan (IMITREX) 100 MG tablet Take 100 mg by mouth daily as needed for migraine. May repeat in 2 hours if headache persists or recurs.  . traZODone (DESYREL) 50 MG tablet Take 50 mg by mouth at bedtime as needed for sleep.   . valACYclovir (VALTREX) 500 MG tablet Take 1 tablet (500 mg total) by mouth daily.  . [DISCONTINUED] acyclovir ointment (ZOVIRAX) 5 % Apply 1 application topically every 3 (three) hours. (Patient not taking: Reported on 04/05/2019)  . [DISCONTINUED] clobetasol ointment (TEMOVATE) [id number] % Apply 1 application topically 2 (two) times a week. Thin application on affected vulva. (Patient not taking: Reported on 04/05/2019)   No facility-administered encounter medications on file as of 04/09/2019.     REVIEW OF SYSTEMS  : All other systems reviewed and negative except where noted in the History of Present Illness.   PHYSICAL EXAM: BP 102/70   Pulse 68   Temp 97.9 F (36.6 C)   Ht 5\' 4"  (1.626 m)   Wt 177 lb (80.3 kg)   BMI 30.38 kg/m  General: Well developed white female in no acute distress Head: Normocephalic and atraumatic Eyes:  Sclerae anicteric, conjunctiva pink. Ears: Normal auditory acuity Lungs: Clear throughout to auscultation; no increased WOB. Heart: Regular rate and rhythm; no M/R/G. Abdomen: Soft, non-distended.  BS present.  Mild left sided TTP. Musculoskeletal: Symmetrical with  no gross deformities  Skin: No lesions on visible extremities Extremities: No edema  Neurological: Alert oriented x 4, grossly non-focal Psychological:  Alert and cooperative. Normal mood and affect  ASSESSMENT AND PLAN: *Left lower quadrant abdominal pain and cramping before bowel movement with increased bloating and gassiness: We will plan for CT scan of the abdomen and pelvis with contrast.  If negative then may just want to consider Levsin for symptomatic treatment related to spasm and cramping, question IBS.   CC:  Koirala, Dibas, MD

## 2019-04-10 ENCOUNTER — Encounter: Payer: Self-pay | Admitting: Obstetrics & Gynecology

## 2019-04-10 ENCOUNTER — Other Ambulatory Visit: Payer: Self-pay | Admitting: Family Medicine

## 2019-04-10 DIAGNOSIS — I259 Chronic ischemic heart disease, unspecified: Secondary | ICD-10-CM

## 2019-04-10 NOTE — Patient Instructions (Signed)
1. Lower abdominal pain Lower abdominal pain with normal gynecologic exam except for stable cystocele grade 2/4.  Unlikely to be gynecologic but will further investigate with a pelvic ultrasound.  Possibly GI in origin.  Will verify relationship of pain and discomfort with different foods. - US Transvaginal Non-OB; Future  2. Postmenopausal Well on no hormone replacement therapy except for estrogen cream.  We will continue the same.  3. Lichen sclerosus et atrophicus of the vulva Lichen sclerosus, needs a represcription of clobetasol ointment.  Usage known.  Prescription sent to pharmacy.  Other orders - clobetasol ointment (TEMOVATE) 0.05 %; Apply 1 application topically 2 (two) times a week. Thin application on affected vulva.  Alexis Braun, it was a pleasure seeing you today!

## 2019-04-14 ENCOUNTER — Ambulatory Visit (INDEPENDENT_AMBULATORY_CARE_PROVIDER_SITE_OTHER)
Admission: RE | Admit: 2019-04-14 | Discharge: 2019-04-14 | Disposition: A | Discharge status: Home / Self Care | Payer: Medicare Other | Source: Ambulatory Visit | Attending: Gastroenterology | Admitting: Gastroenterology

## 2019-04-14 ENCOUNTER — Telehealth: Payer: Self-pay | Admitting: Gastroenterology

## 2019-04-14 ENCOUNTER — Other Ambulatory Visit: Payer: Self-pay

## 2019-04-14 DIAGNOSIS — R1032 Left lower quadrant pain: Secondary | ICD-10-CM

## 2019-04-14 DIAGNOSIS — R109 Unspecified abdominal pain: Secondary | ICD-10-CM | POA: Diagnosis not present

## 2019-04-14 DIAGNOSIS — R140 Abdominal distension (gaseous): Secondary | ICD-10-CM

## 2019-04-14 MED ORDER — IOHEXOL 300 MG/ML  SOLN
100.0000 mL | Freq: Once | INTRAMUSCULAR | Status: AC | PRN
  Administered 2019-04-14: 100 mL via INTRAVENOUS

## 2019-04-14 NOTE — Telephone Encounter (Signed)
The pt states she saw a "worm" in her stool and would like to make an appt with Dr Carlean Purl to discuss.   Appt made for 4/21.  The pt advised we will call her when the CT scan has been reviewed.  The pt has been advised of the information and verbalized understanding.

## 2019-04-14 NOTE — Telephone Encounter (Signed)
Patient called states she when in for her CT scan today and brought with her a sample of a pericyte in stool

## 2019-04-15 ENCOUNTER — Encounter: Payer: Self-pay | Admitting: Gastroenterology

## 2019-04-15 ENCOUNTER — Other Ambulatory Visit: Payer: Self-pay

## 2019-04-15 DIAGNOSIS — R140 Abdominal distension (gaseous): Secondary | ICD-10-CM | POA: Insufficient documentation

## 2019-04-15 DIAGNOSIS — R1032 Left lower quadrant pain: Secondary | ICD-10-CM | POA: Insufficient documentation

## 2019-04-16 ENCOUNTER — Ambulatory Visit: Payer: Medicare Other | Admitting: Obstetrics & Gynecology

## 2019-04-16 ENCOUNTER — Other Ambulatory Visit: Payer: Medicare Other

## 2019-04-16 ENCOUNTER — Ambulatory Visit: Payer: Medicare Other

## 2019-04-16 DIAGNOSIS — E042 Nontoxic multinodular goiter: Secondary | ICD-10-CM | POA: Diagnosis not present

## 2019-04-16 DIAGNOSIS — N952 Postmenopausal atrophic vaginitis: Secondary | ICD-10-CM

## 2019-04-16 DIAGNOSIS — R1030 Lower abdominal pain, unspecified: Secondary | ICD-10-CM | POA: Diagnosis not present

## 2019-04-16 MED ORDER — ESTRADIOL 0.1 MG/GM VA CREA: 0.2500 | TOPICAL_CREAM | VAGINAL | 4 refills | Status: DC

## 2019-04-16 NOTE — Progress Notes (Signed)
    Alexis Braun 11-28-1950 [id number]        69 y.o.  G2P0020 Single  RP: Lower abdominal pain x a few months for Pelvic US  HPI: Still having intermittent lower abdominal pain.  Seen by [facility], undergoing an investigation, just did a CT scan.  On 04/02/2019 we noted:  Intermittent lower abdominal discomfort. Postmenopausal well on no HRT.  No PMB.  H/O Rt Oophorectomy.  Sexually active, no pain with IC except at anterior vulva with stretching of the skin.  Also feels pain with removing her pessary.  Lichen Sclerosus of the vulva, on Clobetasol.  Using Estrogen cream.  Recent Colonoscopy with removal of 4 precancerous Polyps.  Borderline Hypothyroidism.  Thyroid US showed benign looking nodules.   OB History  Gravida Para Term Preterm AB Living  2       2    SAB TAB Ectopic Multiple Live Births               # Outcome Date GA Lbr Len/2nd Weight Sex Delivery Anes PTL Lv  2 AB           1 AB             Past medical history,surgical history, problem list, medications, allergies, family history and social history were all reviewed and documented in the EPIC chart.   Directed ROS with pertinent positives and negatives documented in the history of present illness/assessment and plan.  Exam:  There were no vitals filed for this visit. General appearance:  Normal  Pelvic US today: T/V images.  Small anteverted uterus measured at 5.47 x 4.13 x 3.26 cm.  Anterior fibroid measured at 2.14 x 2.16 cm with calcifications.  Thin symmetrical endometrial lining measured at 2.91 mm, deviated posteriorly by the central fibroid.  No mass or thickening or abnormal blood flow at the level of the endometrium.  Right ovary surgically absent.  Left ovary is small with atrophic appearance.  No adnexal mass.  No free fluid in the posterior cul-de-sac.  Incidental finding: Area of bowel in the left lower quadrant with hypoechoic walls suggestive of inflammation.   Assessment/Plan:  69 y.o. G2P0020   1.  Lower abdominal pain Pelvic US findings reviewed thoroughly with patient.  Small anteverted uterus with only one anterior fibroid measured at 2.16 cm the endometrial lining is normal and thin at 2.91 mm slightly deviated posteriorly by the fibroid.  The right ovary is surgically absent.  The left ovary is small and atrophic with no adnexal mass and no free fluid in the pelvis.  Incidental finding of an area of bowel on the left lower quadrant with hypoechoic walls suggestive of inflammation.  Patient informed and will communicate to her gastroenterologist whom she will see soon to continue the investigation and get the report of her recent CT scan.  2. Postmenopausal atrophic vaginitis Estradiol cream represcribed.  3. Multiple thyroid nodules Referred to Endocrino Dr Chalmers Cater. - Thyroid Panel With TSH - Thyroid stimulating immunoglobulin  Other orders - estradiol (ESTRACE) 0.1 MG/GM vaginal cream; Place [id number] Applicatorfuls vaginally 3 (three) times a week.  Princess Bruins MD, 9:20 AM 04/16/2019

## 2019-04-17 ENCOUNTER — Encounter: Payer: Self-pay | Admitting: Obstetrics & Gynecology

## 2019-04-17 NOTE — Patient Instructions (Signed)
1. Lower abdominal pain Pelvic US findings reviewed thoroughly with patient.  Small anteverted uterus with only one anterior fibroid measured at 2.16 cm the endometrial lining is normal and thin at 2.91 mm slightly deviated posteriorly by the fibroid.  The right ovary is surgically absent.  The left ovary is small and atrophic with no adnexal mass and no free fluid in the pelvis.  Incidental finding of an area of bowel on the left lower quadrant with hypoechoic walls suggestive of inflammation.  Patient informed and will communicate to her gastroenterologist whom she will see soon to continue the investigation and get the report of her recent CT scan.  2. Postmenopausal atrophic vaginitis Estradiol cream represcribed.  3. Multiple thyroid nodules Referred to Endocrino Dr Chalmers Cater. - Thyroid Panel With TSH - Thyroid stimulating immunoglobulin  Other orders - estradiol (ESTRACE) 0.1 MG/GM vaginal cream; Place [id number] Applicatorfuls vaginally 3 (three) times a week.  Mahra, it was a pleasure seeing you today!

## 2019-04-20 ENCOUNTER — Telehealth: Payer: Self-pay | Admitting: *Deleted

## 2019-04-20 LAB — THYROID STIMULATING IMMUNOGLOBULIN: TSI: <89 % baseline (ref <140)

## 2019-04-20 LAB — THYROID PANEL WITH TSH
Free Thyroxine Index: 1.8 (ref 1.4–3.8)
T3 Uptake: 34 % (ref 22–35)
T4, Total: 5.2 ug/dL (ref 5.1–11.9)
TSH: 1.29 mIU/L (ref 0.40–4.50)

## 2019-04-20 NOTE — Telephone Encounter (Signed)
Office notes printed and waiting for TSI results to fax to [facility] office.

## 2019-04-20 NOTE — Telephone Encounter (Signed)
Left detailed message on cell per DPR access. 

## 2019-04-20 NOTE — Telephone Encounter (Signed)
Patient was seen on 04/16/19 prescribed estradiol vaginal cream 0.1 mg/gm directions on Rx are used 3 times weekly. Patient said she thought you told her she could use cream daily? I asked if maybe you told her daily x 1 week and patient said no. She report pain with intercourse due to vaginal dryness and the cream helps when removing pessary as well. Patient would like clarification. Please advise

## 2019-04-20 NOTE — Telephone Encounter (Signed)
Daily for 2 weeks, then 3 times a week.  Can use a very small amount when removing/reinserting the pessary, but if doing that daily, better to use a lubricant.

## 2019-04-20 NOTE — Telephone Encounter (Signed)
-----   Message from Princess Bruins, MD sent at 04/16/2019  9:16 AM EDT ----- Regarding: Refer to Endocrino Dr Chalmers Cater Thyroid nodules/Symptoms of Hypothyroidism.  Pending Thyroid panel.

## 2019-05-01 ENCOUNTER — Other Ambulatory Visit: Payer: Self-pay | Admitting: Obstetrics & Gynecology

## 2019-05-01 DIAGNOSIS — G4700 Insomnia, unspecified: Secondary | ICD-10-CM | POA: Diagnosis not present

## 2019-05-01 DIAGNOSIS — Z6829 Body mass index (BMI) 29.0-29.9, adult: Secondary | ICD-10-CM | POA: Diagnosis not present

## 2019-05-01 DIAGNOSIS — Z23 Encounter for immunization: Secondary | ICD-10-CM | POA: Diagnosis not present

## 2019-05-01 DIAGNOSIS — Z0000 Encounter for general adult medical examination without abnormal findings: Secondary | ICD-10-CM | POA: Diagnosis not present

## 2019-05-01 DIAGNOSIS — Z1231 Encounter for screening mammogram for malignant neoplasm of breast: Secondary | ICD-10-CM

## 2019-05-04 NOTE — Telephone Encounter (Signed)
Left message for patient to call to see if she has been scheduled.  °

## 2019-05-05 DIAGNOSIS — I87392 Chronic venous hypertension (idiopathic) with other complications of left lower extremity: Secondary | ICD-10-CM | POA: Diagnosis not present

## 2019-05-06 ENCOUNTER — Other Ambulatory Visit: Payer: Self-pay | Admitting: Family Medicine

## 2019-05-06 ENCOUNTER — Ambulatory Visit: Payer: Medicare Other | Admitting: Internal Medicine

## 2019-05-06 DIAGNOSIS — I259 Chronic ischemic heart disease, unspecified: Secondary | ICD-10-CM

## 2019-05-07 ENCOUNTER — Other Ambulatory Visit: Payer: Medicare Other

## 2019-05-12 DIAGNOSIS — M25552 Pain in left hip: Secondary | ICD-10-CM | POA: Diagnosis not present

## 2019-05-14 DIAGNOSIS — E010 Iodine-deficiency related diffuse (endemic) goiter: Secondary | ICD-10-CM | POA: Diagnosis not present

## 2019-05-14 DIAGNOSIS — R635 Abnormal weight gain: Secondary | ICD-10-CM | POA: Diagnosis not present

## 2019-05-14 DIAGNOSIS — M2550 Pain in unspecified joint: Secondary | ICD-10-CM | POA: Diagnosis not present

## 2019-05-14 DIAGNOSIS — R5383 Other fatigue: Secondary | ICD-10-CM | POA: Diagnosis not present

## 2019-05-14 NOTE — Telephone Encounter (Signed)
Patient had appointment 06/12/19 with Dr.Balan

## 2019-05-15 ENCOUNTER — Other Ambulatory Visit: Payer: Medicare Other

## 2019-05-15 DIAGNOSIS — H40053 Ocular hypertension, bilateral: Secondary | ICD-10-CM | POA: Diagnosis not present

## 2019-05-15 DIAGNOSIS — H2513 Age-related nuclear cataract, bilateral: Secondary | ICD-10-CM | POA: Diagnosis not present

## 2019-12-02 ENCOUNTER — Other Ambulatory Visit: Payer: Self-pay | Admitting: General Practice

## 2019-12-02 ENCOUNTER — Other Ambulatory Visit: Payer: Self-pay

## 2019-12-02 ENCOUNTER — Ambulatory Visit
Admission: RE | Admit: 2019-12-02 | Discharge: 2019-12-02 | Disposition: A | Discharge status: Home / Self Care | Payer: Medicare Other | Source: Ambulatory Visit | Attending: Obstetrics & Gynecology | Admitting: Obstetrics & Gynecology

## 2019-12-02 DIAGNOSIS — Z1231 Encounter for screening mammogram for malignant neoplasm of breast: Secondary | ICD-10-CM

## 2019-12-02 DIAGNOSIS — Z9189 Other specified personal risk factors, not elsewhere classified: Secondary | ICD-10-CM

## 2019-12-08 DIAGNOSIS — G4733 Obstructive sleep apnea (adult) (pediatric): Secondary | ICD-10-CM | POA: Diagnosis not present

## 2019-12-21 DIAGNOSIS — D2272 Melanocytic nevi of left lower limb, including hip: Secondary | ICD-10-CM | POA: Diagnosis not present

## 2019-12-21 DIAGNOSIS — L821 Other seborrheic keratosis: Secondary | ICD-10-CM | POA: Diagnosis not present

## 2019-12-21 DIAGNOSIS — D1801 Hemangioma of skin and subcutaneous tissue: Secondary | ICD-10-CM | POA: Diagnosis not present

## 2019-12-21 DIAGNOSIS — D225 Melanocytic nevi of trunk: Secondary | ICD-10-CM | POA: Diagnosis not present

## 2019-12-22 ENCOUNTER — Telehealth: Payer: Self-pay | Admitting: Cardiovascular Disease

## 2019-12-22 NOTE — Telephone Encounter (Signed)
Left message for patient to call back  

## 2019-12-22 NOTE — Telephone Encounter (Signed)
New Message:     Pt said her primary doctor thinks she needs a CT.She wants to know if Dr Johnsie Cancel thinks she needs a CT ?

## 2019-12-31 ENCOUNTER — Other Ambulatory Visit: Payer: Medicare Other

## 2020-01-22 DIAGNOSIS — L570 Actinic keratosis: Secondary | ICD-10-CM | POA: Diagnosis not present

## 2020-01-25 ENCOUNTER — Other Ambulatory Visit: Payer: Self-pay | Admitting: Obstetrics & Gynecology

## 2020-02-03 ENCOUNTER — Ambulatory Visit: Payer: Medicare Other | Admitting: Obstetrics & Gynecology

## 2020-02-09 DIAGNOSIS — R251 Tremor, unspecified: Secondary | ICD-10-CM | POA: Diagnosis not present

## 2020-02-09 DIAGNOSIS — R200 Anesthesia of skin: Secondary | ICD-10-CM | POA: Diagnosis not present

## 2020-02-09 DIAGNOSIS — Z8619 Personal history of other infectious and parasitic diseases: Secondary | ICD-10-CM | POA: Diagnosis not present

## 2020-02-09 DIAGNOSIS — E7800 Pure hypercholesterolemia, unspecified: Secondary | ICD-10-CM | POA: Diagnosis not present

## 2020-02-10 DIAGNOSIS — R251 Tremor, unspecified: Secondary | ICD-10-CM | POA: Diagnosis not present

## 2020-02-10 DIAGNOSIS — Z8619 Personal history of other infectious and parasitic diseases: Secondary | ICD-10-CM | POA: Diagnosis not present

## 2020-02-10 DIAGNOSIS — E7800 Pure hypercholesterolemia, unspecified: Secondary | ICD-10-CM | POA: Diagnosis not present

## 2020-02-10 DIAGNOSIS — R200 Anesthesia of skin: Secondary | ICD-10-CM | POA: Diagnosis not present

## 2020-03-01 DIAGNOSIS — Z6830 Body mass index (BMI) 30.0-30.9, adult: Secondary | ICD-10-CM | POA: Diagnosis not present

## 2020-03-01 DIAGNOSIS — R251 Tremor, unspecified: Secondary | ICD-10-CM | POA: Diagnosis not present

## 2020-03-01 DIAGNOSIS — R5383 Other fatigue: Secondary | ICD-10-CM | POA: Diagnosis not present

## 2020-03-01 DIAGNOSIS — G629 Polyneuropathy, unspecified: Secondary | ICD-10-CM | POA: Diagnosis not present

## 2020-03-03 NOTE — Telephone Encounter (Signed)
Will await for patient to return call.

## 2020-03-10 DIAGNOSIS — G4733 Obstructive sleep apnea (adult) (pediatric): Secondary | ICD-10-CM | POA: Diagnosis not present

## 2020-03-14 DIAGNOSIS — G4733 Obstructive sleep apnea (adult) (pediatric): Secondary | ICD-10-CM | POA: Diagnosis not present

## 2020-03-22 DIAGNOSIS — M25552 Pain in left hip: Secondary | ICD-10-CM | POA: Diagnosis not present

## 2020-03-23 ENCOUNTER — Ambulatory Visit: Payer: Medicare Other | Admitting: Obstetrics & Gynecology

## 2020-04-06 ENCOUNTER — Ambulatory Visit: Payer: Medicare Other | Admitting: Gastroenterology

## 2020-04-07 ENCOUNTER — Ambulatory Visit: Payer: Medicare Other | Admitting: Gastroenterology

## 2020-04-21 ENCOUNTER — Ambulatory Visit: Payer: Medicare Other | Admitting: Gastroenterology

## 2020-04-21 ENCOUNTER — Encounter: Payer: Self-pay | Admitting: Gastroenterology

## 2020-04-21 VITALS — BP 122/64 | HR 60 | Ht 64.0 in | Wt 176.6 lb

## 2020-04-21 DIAGNOSIS — R1310 Dysphagia, unspecified: Secondary | ICD-10-CM | POA: Insufficient documentation

## 2020-04-21 DIAGNOSIS — K219 Gastro-esophageal reflux disease without esophagitis: Secondary | ICD-10-CM | POA: Diagnosis not present

## 2020-04-21 NOTE — Patient Instructions (Signed)
If you are age 68 or older, your body mass index should be between 23-30. Your Body mass index is 30.31 kg/m. If this is out of the aforementioned range listed, please consider follow up with your Primary Care Provider.  If you are age 43 or younger, your body mass index should be between 19-25. Your Body mass index is 30.31 kg/m. If this is out of the aformentioned range listed, please consider follow up with your Primary Care Provider.   You have been scheduled for an endoscopy. Please follow written instructions given to you at your visit today. If you use inhalers (even only as needed), please bring them with you on the day of your procedure.  Please increase your Pepcid.  Thank you for choosing me and [facility].  Alonza Bogus, PA-C

## 2020-04-21 NOTE — Progress Notes (Signed)
04/21/2020 Samyah Bilbo [id number] [birth year]   HISTORY OF PRESENT ILLNESS: This is a 70 year old female is a patient of Dr. Celesta Aver.  She presents here today with complaints of esophageal reflux and chest discomfort with burning in her throat she has been having symptoms for the past 1 to 2 years.  Occasionally feels like certain foods get hung up when swallowing.  She is using Pepcid 20 mg daily and omeprazole over-the-counter as needed.  She is adamant about not taking omeprazole/PPI regularly as she knows 2 people who have renal dysfunction from using PPIs.  Her last EGD was in 2013 by Dr. Olevia Perches at which time she was found to have esophagitis and hiatal hernia.  There was no stricture but Maloney dilator was passed.   Past Medical History:  Diagnosis Date  . Allergy    sulfa  . Anxiety   . Arthritis   . Bladder prolapse, female, acquired   . Chest pain    per pt, over couple years  . Congenital hearing loss    right  . Gallbladder polyp 2014  . GERD (gastroesophageal reflux disease)   . Hepatitis    as teen-iv drugs  . Hx of adenomatous and sessile serrated colonic polyps 12/24/2017  . Hypertension    on medication  . Internal hemorrhoid   . Migraines   . Ovarian cyst 04/03/1999  . PONV (postoperative nausea and vomiting)   . Positive hepatitis C antibody test    at age 33; has been told that she "self cured" No current or past treatment.  . Sleep apnea    uses c-pap  . Wears hearing aid    right   Past Surgical History:  Procedure Laterality Date  . CHOLECYSTECTOMY N/A 10/08/2012   Procedure: LAPAROSCOPIC CHOLECYSTECTOMY WITH INTRAOPERATIVE CHOLANGIOGRAM;  Surgeon: Imogene Burn. Georgette Dover, MD;  Location: [facility];  Service: General;  Laterality: N/A;  . CHONDROPLASTY Right 03/31/2014   Procedure: CHONDROPLASTY;  Surgeon: Frederik Pear, MD;  Location: [facility];  Service: Orthopedics;  Laterality: Right;  . COLONOSCOPY     2007  . HEMORRHOID BANDING  2017   . KNEE ARTHROSCOPY WITH LATERAL MENISECTOMY Right 03/31/2014   Procedure: KNEE ARTHROSCOPY WITH  PARTIAL LATERAL MENISECTOMY;  Surgeon: Frederik Pear, MD;  Location: [facility];  Service: Orthopedics;  Laterality: Right;  . KNEE ARTHROSCOPY WITH MEDIAL MENISECTOMY Right 03/31/2014   Procedure: KNEE ARTHROSCOPY WITH PARTIALMEDIAL MENISECTOMY;  Surgeon: Frederik Pear, MD;  Location: [facility];  Service: Orthopedics;  Laterality: Right;  . OOPHORECTOMY  2002   Right   . SIGMOIDOSCOPY     1999    reports that she has never smoked. She has never used smokeless tobacco. She reports previous alcohol use. She reports that she does not use drugs. family history includes Dementia in her mother; Heart attack in her father; Heart disease in her father and mother; Migraines in her mother. Allergies  Allergen Reactions  . Sulfa Antibiotics Anaphylaxis and Swelling    Nose & throat swells  . Statins Other (See Comments)    History of Hepatitis C Patient will not take them      Outpatient Encounter Medications as of 04/21/2020  Medication Sig  . ALPRAZolam (XANAX) 0.5 MG tablet Take 0.5 mg by mouth at bedtime as needed for sleep.  Marland Kitchen aspirin EC 81 MG tablet Take 81 mg by mouth daily.  Marland Kitchen atenolol (TENORMIN) 25 MG tablet Take 12.5 mg by mouth daily.  Marland Kitchen  cyclobenzaprine (FLEXERIL) 10 MG tablet Take 10 mg by mouth 3 (three) times daily as needed for muscle spasms.  Marland Kitchen estradiol (ESTRACE) 0.1 MG/GM vaginal cream INSERT 1/4 APPLICATORFUL VAGINALLY 3 TIMES A WEEK  . famotidine (PEPCID) 20 MG tablet Take 20 mg by mouth daily.  . Flaxseed, Linseed, (FLAX SEED OIL PO) Take 1 capsule by mouth daily.  . Omega-3 Fatty Acids (FISH OIL) 1000 MG CAPS Take 1 capsule by mouth daily.  Marland Kitchen omeprazole (PRILOSEC) 20 MG capsule Take 20 mg by mouth See admin instructions. Takes 2-3 times per week  . psyllium (REGULOID) 0.52 g capsule Take 0.52 g by mouth daily.  . SUMAtriptan (IMITREX) 100 MG tablet Take  100 mg by mouth daily as needed for migraine. May repeat in 2 hours if headache persists or recurs.  . traZODone (DESYREL) 50 MG tablet Take 50 mg by mouth at bedtime as needed for sleep.  . valACYclovir (VALTREX) 500 MG tablet Take 1 tablet (500 mg total) by mouth daily.  . [DISCONTINUED] hydrocortisone (ANUSOL-HC) 25 MG suppository Place 1 suppository (25 mg total) rectally 2 (two) times daily. Use for 7 days  . [DISCONTINUED] ibuprofen (ADVIL,MOTRIN) 200 MG tablet Take 200-400 mg by mouth daily as needed for pain.    No facility-administered encounter medications on file as of 04/21/2020.     REVIEW OF SYSTEMS  : All other systems reviewed and negative except where noted in the History of Present Illness.   PHYSICAL EXAM: BP 122/64   Pulse 60   Ht 5\' 4"  (1.626 m)   Wt 176 lb 9.6 oz (80.1 kg)   SpO2 99%   BMI 30.31 kg/m  General: Well developed white female in no acute distress Head: Normocephalic and atraumatic Eyes:  Sclerae anicteric, conjunctiva pink. Ears: Normal auditory acuity Lungs: Clear throughout to auscultation; no W/R/R. Heart: Regular rate and rhythm; no M/R/G. Abdomen: Soft, non-distended.  BS present.  Non-tender. Musculoskeletal: Symmetrical with no gross deformities  Skin: No lesions on visible extremities Extremities: No edema  Neurological: Alert oriented x 4, grossly non-focal Psychological:  Alert and cooperative. Normal mood and affect  ASSESSMENT AND PLAN: *GERD with dysphagia and atypical chest pain: Has history of esophagitis on previous EGD in March 2013.  No with ongoing symptoms for about the past year.  Currently only taking Pepcid 20 mg daily and omeprazole over-the-counter as needed.  Adamant about not taking PPI daily.  Will increase Pepcid to twice daily.  We will plan for EGD with Dr. Carlean Purl.  The risks, benefits, and alternatives to EGD with possible dilation were discussed with the patient and she consents to proceed.   CC:  Kathyrn Lass,  MD

## 2020-04-28 ENCOUNTER — Other Ambulatory Visit (HOSPITAL_COMMUNITY)
Admission: RE | Admit: 2020-04-28 | Discharge: 2020-04-28 | Disposition: A | Discharge status: Home / Self Care | Payer: Medicare Other | Source: Ambulatory Visit | Attending: Obstetrics & Gynecology | Admitting: Obstetrics & Gynecology

## 2020-04-28 ENCOUNTER — Ambulatory Visit (INDEPENDENT_AMBULATORY_CARE_PROVIDER_SITE_OTHER): Payer: Medicare Other | Admitting: Obstetrics & Gynecology

## 2020-04-28 ENCOUNTER — Other Ambulatory Visit: Payer: Self-pay

## 2020-04-28 ENCOUNTER — Encounter: Payer: Self-pay | Admitting: Obstetrics & Gynecology

## 2020-04-28 VITALS — BP 126/80 | Ht 64.25 in | Wt 174.0 lb

## 2020-04-28 DIAGNOSIS — N952 Postmenopausal atrophic vaginitis: Secondary | ICD-10-CM

## 2020-04-28 DIAGNOSIS — Z01419 Encounter for gynecological examination (general) (routine) without abnormal findings: Secondary | ICD-10-CM | POA: Insufficient documentation

## 2020-04-28 DIAGNOSIS — Z780 Asymptomatic menopausal state: Secondary | ICD-10-CM

## 2020-04-28 MED ORDER — ESTRADIOL 0.1 MG/GM VA CREA: TOPICAL_CREAM | VAGINAL | 4 refills | Status: DC

## 2020-04-28 NOTE — Progress Notes (Signed)
Alexis Braun [birth year] [id number]   History:    70 y.o. G2P0A2 Single  RP:  Established patient presenting for annual gyn exam   HPI: H/O Rt Oophorectomy.  Post menopause on no HRT.  No PMB.  No pelvic pain.  Abstinent.  Uses Estrace cream twice a week. Cystocele, not using the pessary anymore, better with PT for pelvic floor.  Mild SUI improved.  Breasts normal.  BMI 29.64.  Enjoys swimming.  Health labs with Fam MD.  Past medical history,surgical history, family history and social history were all reviewed and documented in the EPIC chart.  Gynecologic History No LMP recorded. Patient is postmenopausal.  Obstetric History OB History  Gravida Para Term Preterm AB Living  2       2    SAB IAB Ectopic Multiple Live Births               # Outcome Date GA Lbr Len/2nd Weight Sex Delivery Anes PTL Lv  2 AB           1 AB              ROS: A ROS was performed and pertinent positives and negatives are included in the history.  GENERAL: No fevers or chills. HEENT: No change in vision, no earache, sore throat or sinus congestion. NECK: No pain or stiffness. CARDIOVASCULAR: No chest pain or pressure. No palpitations. PULMONARY: No shortness of breath, cough or wheeze. GASTROINTESTINAL: No abdominal pain, nausea, vomiting or diarrhea, melena or bright red blood per rectum. GENITOURINARY: No urinary frequency, urgency, hesitancy or dysuria. MUSCULOSKELETAL: No joint or muscle pain, no back pain, no recent trauma. DERMATOLOGIC: No rash, no itching, no lesions. ENDOCRINE: No polyuria, polydipsia, no heat or cold intolerance. No recent change in weight. HEMATOLOGICAL: No anemia or easy bruising or bleeding. NEUROLOGIC: No headache, seizures, numbness, tingling or weakness. PSYCHIATRIC: No depression, no loss of interest in normal activity or change in sleep pattern.     Exam:   BP 126/80   Ht 5' 4.25" (1.632 m)   Wt 174 lb (78.9 kg)   BMI 29.64 kg/m   Body mass index is 29.64  kg/m.  General appearance : Well developed well nourished female. No acute distress HEENT: Eyes: no retinal hemorrhage or exudates,  Neck supple, trachea midline, no carotid bruits, no thyroidmegaly Lungs: Clear to auscultation, no rhonchi or wheezes, or rib retractions  Heart: Regular rate and rhythm, no murmurs or gallops Breast:Examined in sitting and supine position were symmetrical in appearance, no palpable masses or tenderness,  no skin retraction, no nipple inversion, no nipple discharge, no skin discoloration, no axillary or supraclavicular lymphadenopathy Abdomen: no palpable masses or tenderness, no rebound or guarding Extremities: no edema or skin discoloration or tenderness  Pelvic: Vulva: Normal             Vagina: No gross lesions or discharge  Cervix: No gross lesions or discharge.  Pap reflex done.  Uterus  AV, normal size, shape and consistency, non-tender and mobile  Adnexa  Without masses or tenderness  Anus: Normal   Assessment/Plan:  70 y.o. female for annual exam   1. Encounter for routine gynecological examination with Papanicolaou smear of cervix Normal gynecologic exam in menopause.  Pap reflex done.  Breast exam normal.  Screening mammogram November 2021 was negative.  Colonoscopy in 2019.  Health labs with family physician.  Body mass index 29.64.  Good fitness and healthy nutrition.  Swimmer.   -  Cytology - PAP( [place])  2. Postmenopausal Well on no systemic hormone replacement therapy.  No postmenopausal bleeding.  Will plan a bone density within the next 3 years.  Continue with regular weightbearing physical activities, vitamin D and calcium 1.5 g/day total.  3. Postmenopausal atrophic vaginitis Well with estradiol cream.  No contraindication to continue.  Prescription sent to pharmacy.  Other orders - estradiol (ESTRACE) 0.1 MG/GM vaginal cream; INSERT 1/4 APPLICATORFUL VAGINALLY 3 TIMES A WEEK  Princess Bruins MD, 4:07 PM 04/28/2020

## 2020-04-29 LAB — CYTOLOGY - PAP: Diagnosis: NEGATIVE

## 2020-05-02 ENCOUNTER — Encounter: Payer: Self-pay | Admitting: Internal Medicine

## 2020-05-02 ENCOUNTER — Ambulatory Visit (AMBULATORY_SURGERY_CENTER): Payer: Medicare Other | Admitting: Internal Medicine

## 2020-05-02 ENCOUNTER — Other Ambulatory Visit: Payer: Self-pay

## 2020-05-02 VITALS — BP 130/48 | HR 54 | Temp 97.1°F | Resp 18 | Ht 64.0 in | Wt 176.0 lb

## 2020-05-02 DIAGNOSIS — I10 Essential (primary) hypertension: Secondary | ICD-10-CM | POA: Diagnosis not present

## 2020-05-02 DIAGNOSIS — R12 Heartburn: Secondary | ICD-10-CM | POA: Diagnosis not present

## 2020-05-02 DIAGNOSIS — K2270 Barrett's esophagus without dysplasia: Secondary | ICD-10-CM

## 2020-05-02 DIAGNOSIS — K219 Gastro-esophageal reflux disease without esophagitis: Secondary | ICD-10-CM

## 2020-05-02 DIAGNOSIS — G4733 Obstructive sleep apnea (adult) (pediatric): Secondary | ICD-10-CM | POA: Diagnosis not present

## 2020-05-02 DIAGNOSIS — R1310 Dysphagia, unspecified: Secondary | ICD-10-CM | POA: Diagnosis not present

## 2020-05-02 DIAGNOSIS — K2289 Other specified disease of esophagus: Secondary | ICD-10-CM | POA: Diagnosis not present

## 2020-05-02 MED ORDER — SODIUM CHLORIDE 0.9 % IV SOLN: 500.0000 mL | INTRAVENOUS | Status: DC

## 2020-05-02 NOTE — Progress Notes (Signed)
Report given to PACU, vss 

## 2020-05-02 NOTE — Progress Notes (Signed)
Called to room to assist during endoscopic procedure.  Patient ID and intended procedure confirmed with present staff. Received instructions for my participation in the procedure from the performing physician.  

## 2020-05-02 NOTE — Op Note (Signed)
[facility] Patient Name: Alexis Braun Procedure Date: 05/02/2020 1:22 PM MRN: [id number] Endoscopist: Gatha Mayer , MD Age: 70 Referring MD:  Date of Birth: 1950/10/06 Gender: Female Account #: [id number] Procedure:                Upper GI endoscopy Indications:              Dysphagia, Heartburn Medicines:                Propofol per Anesthesia, Monitored Anesthesia Care Procedure:                Pre-Anesthesia Assessment:                           - Prior to the procedure, a History and Physical                            was performed, and patient medications and                            allergies were reviewed. The patient's tolerance of                            previous anesthesia was also reviewed. The risks                            and benefits of the procedure and the sedation                            options and risks were discussed with the patient.                            All questions were answered, and informed consent                            was obtained. Prior Anticoagulants: The patient has                            taken no previous anticoagulant or antiplatelet                            agents. ASA Grade Assessment: II - A patient with                            mild systemic disease. After reviewing the risks                            and benefits, the patient was deemed in                            satisfactory condition to undergo the procedure.                           After obtaining informed consent, the endoscope was  passed under direct vision. Throughout the                            procedure, the patient's blood pressure, pulse, and                            oxygen saturations were monitored continuously. The                            Endoscope was introduced through the mouth, and                            advanced to the second part of duodenum. The upper                            GI endoscopy  was accomplished without difficulty.                            The patient tolerated the procedure well. Scope In: Scope Out: Findings:                 The Z-line was irregular and was found at the                            gastroesophageal junction. Biopsies were taken with                            a cold forceps for histology. Verification of                            patient identification for the specimen was done.                            Estimated blood loss was minimal.                           The entire examined stomach was normal.                           The examined duodenum was normal.                           The cardia and gastric fundus were normal on                            retroflexion.                           The scope was withdrawn. Dilation was performed in                            the entire esophagus with a Maloney dilator with                            mild resistance at 54 Fr. Complications:  No immediate complications. Estimated Blood Loss:     Estimated blood loss was minimal. Impression:               - Z-line irregular, at the gastroesophageal                            junction. Biopsied.                           - Normal stomach.                           - Normal examined duodenum. Recommendation:           - Patient has a contact number available for                            emergencies. The signs and symptoms of potential                            delayed complications were discussed with the                            patient. Return to normal activities tomorrow.                            Written discharge instructions were provided to the                            patient.                           - Clear liquids x 1 hour then soft foods rest of                            day. Start prior diet tomorrow.                           - Continue present medications.                           - Await pathology  results. Gatha Mayer, MD 05/02/2020 1:54:20 PM This report has been signed electronically.

## 2020-05-02 NOTE — Progress Notes (Signed)
1307 Robinul 0.1 mg IV given due large amount of secretions upon assessment.  MD made aware, vss

## 2020-05-02 NOTE — Patient Instructions (Addendum)
There was a ? of inflammation at the area where the esophagus and stomach meet. I took biopsies.  I dilated the esophagus as well.  Once I see results will communicate with you.  I am away starting Thursday so please be patient with me but I will try to check results some while off.  I appreciate the opportunity to care for you. Gatha Mayer, MD, FACG  YOU HAD AN ENDOSCOPIC PROCEDURE TODAY AT [facility]:   Refer to the procedure report that was given to you for any specific questions about what was found during the examination.  If the procedure report does not answer your questions, please call your gastroenterologist to clarify.  If you requested that your care partner not be given the details of your procedure findings, then the procedure report has been included in a sealed envelope for you to review at your convenience later.  YOU SHOULD EXPECT: Some feelings of bloating in the abdomen. Passage of more gas than usual.  Walking can help get rid of the air that was put into your GI tract during the procedure and reduce the bloating. If you had a lower endoscopy (such as a colonoscopy or flexible sigmoidoscopy) you may notice spotting of blood in your stool or on the toilet paper. If you underwent a bowel prep for your procedure, you may not have a normal bowel movement for a few days.  Please Note:  You might notice some irritation and congestion in your nose or some drainage.  This is from the oxygen used during your procedure.  There is no need for concern and it should clear up in a day or so.  SYMPTOMS TO REPORT IMMEDIATELY:   Following upper endoscopy (EGD)  Vomiting of blood or coffee ground material  New chest pain or pain under the shoulder blades  Painful or persistently difficult swallowing  New shortness of breath  Fever of 100F or higher  Black, tarry-looking stools  For urgent or emergent issues, a gastroenterologist can be reached at any hour by  calling [phone]. Do not use MyChart messaging for urgent concerns.    DIET: Post-dilation diet: Clear liquids for 1 hour (until 2:50pm). Then a Soft diet (see handout) for the rest of today. Resume your regular diet tomorrow. Drink plenty of fluids but you should avoid alcoholic beverages for 24 hours.  ACTIVITY:  You should plan to take it easy for the rest of today and you should NOT DRIVE or use heavy machinery until tomorrow (because of the sedation medicines used during the test).    FOLLOW UP: Our staff will call the number listed on your records 48-72 hours following your procedure to check on you and address any questions or concerns that you may have regarding the information given to you following your procedure. If we do not reach you, we will leave a message.  We will attempt to reach you two times.  During this call, we will ask if you have developed any symptoms of COVID 19. If you develop any symptoms (ie: fever, flu-like symptoms, shortness of breath, cough etc.) before then, please call [phone].  If you test positive for Covid 19 in the 2 weeks post procedure, please call and report this information to [place].    If any biopsies were taken you will be contacted by phone or by letter within the next 1-3 weeks.  Please call us at [phone] if you have not heard about the biopsies  in 3 weeks.    SIGNATURES/CONFIDENTIALITY: You and/or your care partner have signed paperwork which will be entered into your electronic medical record.  These signatures attest to the fact that that the information above on your After Visit Summary has been reviewed and is understood.  Full responsibility of the confidentiality of this discharge information lies with you and/or your care-partner.

## 2020-05-04 ENCOUNTER — Telehealth: Payer: Self-pay

## 2020-05-04 NOTE — Telephone Encounter (Signed)
  Follow up Call-  Call back number 05/02/2020 12/16/2017  Post procedure Call Back phone  # [phone]  Permission to leave phone message Yes Yes  Some recent data might be hidden     Patient questions:  Do you have a fever, pain , or abdominal swelling? No. Pain Score  0 *  Have you tolerated food without any problems? Yes.    Have you been able to return to your normal activities? Yes.    Do you have any questions about your discharge instructions: Diet   No. Medications  No. Follow up visit  No.  Do you have questions or concerns about your Care? Yes.    Actions: * If pain score is 4 or above: No action needed, pain <4.  Pt asked what an irregular z-line ment.  I explained to pt.  She asked if it could be cancerous.  I relayed to her that Dr. Celesta Aver report did not display any s & s of cancer.  But to await pathology results 1-2 weeks. Maw  1. Have you developed a fever since your procedure? no  2.   Have you had an respiratory symptoms (SOB or cough) since your procedure? no  3.   Have you tested positive for COVID 19 since your procedure no  4.   Have you had any family members/close contacts diagnosed with the COVID 19 since your procedure?  no   If yes to any of these questions please route to Joylene John, RN and Joella Prince, RN

## 2020-05-20 DIAGNOSIS — H5203 Hypermetropia, bilateral: Secondary | ICD-10-CM | POA: Diagnosis not present

## 2020-05-23 DIAGNOSIS — G4733 Obstructive sleep apnea (adult) (pediatric): Secondary | ICD-10-CM | POA: Diagnosis not present

## 2020-09-07 DIAGNOSIS — G4733 Obstructive sleep apnea (adult) (pediatric): Secondary | ICD-10-CM | POA: Diagnosis not present

## 2020-10-31 DIAGNOSIS — G4700 Insomnia, unspecified: Secondary | ICD-10-CM | POA: Diagnosis not present

## 2020-10-31 DIAGNOSIS — F419 Anxiety disorder, unspecified: Secondary | ICD-10-CM | POA: Diagnosis not present

## 2020-10-31 DIAGNOSIS — Z79899 Other long term (current) drug therapy: Secondary | ICD-10-CM | POA: Diagnosis not present

## 2020-10-31 DIAGNOSIS — G43909 Migraine, unspecified, not intractable, without status migrainosus: Secondary | ICD-10-CM | POA: Diagnosis not present

## 2020-10-31 DIAGNOSIS — I700 Atherosclerosis of aorta: Secondary | ICD-10-CM | POA: Diagnosis not present

## 2020-10-31 DIAGNOSIS — Z0000 Encounter for general adult medical examination without abnormal findings: Secondary | ICD-10-CM | POA: Diagnosis not present

## 2020-10-31 DIAGNOSIS — E559 Vitamin D deficiency, unspecified: Secondary | ICD-10-CM | POA: Diagnosis not present

## 2020-11-04 DIAGNOSIS — Z1211 Encounter for screening for malignant neoplasm of colon: Secondary | ICD-10-CM | POA: Diagnosis not present

## 2020-11-04 DIAGNOSIS — Z1212 Encounter for screening for malignant neoplasm of rectum: Secondary | ICD-10-CM | POA: Diagnosis not present

## 2020-11-04 LAB — COLOGUARD: Cologuard: POSITIVE — AB

## 2020-11-15 ENCOUNTER — Telehealth: Payer: Self-pay | Admitting: Internal Medicine

## 2020-11-15 NOTE — Telephone Encounter (Signed)
Received a call from [place].  Patient did a Cologuard on 109/21/22 and results came back positive.  She is on recall for 12/2020.  Please call patient and let her know how to proceed.  Thank you.

## 2020-11-16 NOTE — Telephone Encounter (Signed)
Colonoscopy scheduled for 01/20/2020 @ 2:00 with Dr. Carlean Purl Previsit scheduled for 12/30/2020 @ 4:30  Left message for pt to call back

## 2020-11-16 NOTE — Telephone Encounter (Signed)
Schedule direct colonoscopy diagnosis history of polyps positive Cologuard

## 2020-11-16 NOTE — Telephone Encounter (Signed)
Please see note below and advise  

## 2020-11-17 NOTE — Telephone Encounter (Signed)
Left message to call back  

## 2020-11-17 NOTE — Telephone Encounter (Signed)
Pt states that the appointments that were previously scheduled do not work for her due to the fact that she is going out of town until mid January.  Pt rescheduled for 02/03/2021 @ 1:00 for previsit Pt rescheduled for 02/08/2021 @ 11:30 for a colonoscopy.  Pt made aware Pt verbalized understanding with all questions answered.

## 2020-11-22 DIAGNOSIS — Z6829 Body mass index (BMI) 29.0-29.9, adult: Secondary | ICD-10-CM | POA: Diagnosis not present

## 2020-11-22 DIAGNOSIS — G4733 Obstructive sleep apnea (adult) (pediatric): Secondary | ICD-10-CM | POA: Diagnosis not present

## 2020-11-23 ENCOUNTER — Other Ambulatory Visit: Payer: Self-pay | Admitting: Obstetrics & Gynecology

## 2020-11-23 DIAGNOSIS — Z1231 Encounter for screening mammogram for malignant neoplasm of breast: Secondary | ICD-10-CM

## 2020-12-05 ENCOUNTER — Ambulatory Visit: Payer: Medicare Other

## 2020-12-21 ENCOUNTER — Telehealth: Payer: Self-pay | Admitting: Internal Medicine

## 2020-12-21 NOTE — Telephone Encounter (Signed)
Inbound call from patient, states that she believes that she may have split a hemorrhoid and is seeking advice. Please advise.

## 2020-12-22 NOTE — Telephone Encounter (Signed)
Pt states that's she felt that she Split a hemorrhoid  yesterday. Pt states that she was feeling pain at the hemorrhoid site and  has been feeling constipated as well. Pt stated that she did take some Doculax with some results. Pt was encourage to to take Miralax daily until she feels that she is no longer constipated. Pt encouraged to use wet wipes, soak in a warm tub or water; and she may use OTC preparation H to relieve the pain. Pt notified to call back in 1 week if her symptoms have worsened. Pt verbalized understanding with all questions answered.

## 2020-12-27 DIAGNOSIS — H903 Sensorineural hearing loss, bilateral: Secondary | ICD-10-CM | POA: Diagnosis not present

## 2020-12-29 DIAGNOSIS — L821 Other seborrheic keratosis: Secondary | ICD-10-CM | POA: Diagnosis not present

## 2020-12-29 DIAGNOSIS — D2261 Melanocytic nevi of right upper limb, including shoulder: Secondary | ICD-10-CM | POA: Diagnosis not present

## 2020-12-29 DIAGNOSIS — D225 Melanocytic nevi of trunk: Secondary | ICD-10-CM | POA: Diagnosis not present

## 2020-12-29 DIAGNOSIS — Z85828 Personal history of other malignant neoplasm of skin: Secondary | ICD-10-CM | POA: Diagnosis not present

## 2021-01-04 ENCOUNTER — Ambulatory Visit: Payer: Medicare Other | Admitting: Physician Assistant

## 2021-01-19 ENCOUNTER — Encounter: Payer: Medicare Other | Admitting: Internal Medicine

## 2021-01-26 ENCOUNTER — Telehealth: Payer: Self-pay | Admitting: Internal Medicine

## 2021-01-26 DIAGNOSIS — H04123 Dry eye syndrome of bilateral lacrimal glands: Secondary | ICD-10-CM | POA: Diagnosis not present

## 2021-01-26 DIAGNOSIS — H01004 Unspecified blepharitis left upper eyelid: Secondary | ICD-10-CM | POA: Diagnosis not present

## 2021-01-26 DIAGNOSIS — H01001 Unspecified blepharitis right upper eyelid: Secondary | ICD-10-CM | POA: Diagnosis not present

## 2021-01-26 NOTE — Telephone Encounter (Signed)
I spoke to Alexis Braun and told her some of the supplies she will need to prep. I could see in the letter section that the Miralax prep is to be used for her colonoscopy.

## 2021-01-26 NOTE — Telephone Encounter (Signed)
Patient is scheduled for her previsit appointment on 1/20 with her procedure on 1/25.  She has an appointment on 1/24 and is requesting someone tell her what her prep will be NOW, so she will know if she can make that 1/24 appointment.  It was explained that she would get that information from the previsit nurse, but she insisted someone call her and tell her now.  Please advise.  Thank you.

## 2021-01-31 ENCOUNTER — Telehealth: Payer: Self-pay | Admitting: Internal Medicine

## 2021-01-31 NOTE — Telephone Encounter (Signed)
Patient called states she does not have her last colonoscopy report in [place].

## 2021-01-31 NOTE — Telephone Encounter (Signed)
Pt states that she is requesting 2019 Colonoscopy Report: A letter was created and sent to pt via My Chart. Pt made aware Pt verbalized understanding with all questions answered.

## 2021-02-03 ENCOUNTER — Other Ambulatory Visit: Payer: Self-pay

## 2021-02-03 ENCOUNTER — Encounter: Payer: Self-pay | Admitting: Internal Medicine

## 2021-02-03 ENCOUNTER — Ambulatory Visit (AMBULATORY_SURGERY_CENTER): Payer: Self-pay

## 2021-02-03 VITALS — Ht 64.0 in | Wt 178.6 lb

## 2021-02-03 DIAGNOSIS — Z86010 Personal history of colonic polyps: Secondary | ICD-10-CM

## 2021-02-03 NOTE — Progress Notes (Signed)
Denies allergies to eggs or soy products. Denies complication of anesthesia or sedation. Denies use of weight loss medication. Denies use of O2.   Emmi instructions given for colonoscopy.  

## 2021-02-06 ENCOUNTER — Ambulatory Visit
Admission: RE | Admit: 2021-02-06 | Discharge: 2021-02-06 | Disposition: A | Discharge status: Home / Self Care | Payer: Medicare Other | Source: Ambulatory Visit | Attending: Obstetrics & Gynecology | Admitting: Obstetrics & Gynecology

## 2021-02-06 DIAGNOSIS — Z1231 Encounter for screening mammogram for malignant neoplasm of breast: Secondary | ICD-10-CM | POA: Diagnosis not present

## 2021-02-08 ENCOUNTER — Encounter: Payer: Self-pay | Admitting: Internal Medicine

## 2021-02-08 ENCOUNTER — Other Ambulatory Visit: Payer: Self-pay

## 2021-02-08 ENCOUNTER — Ambulatory Visit (AMBULATORY_SURGERY_CENTER): Payer: Medicare Other | Admitting: Internal Medicine

## 2021-02-08 VITALS — BP 109/59 | HR 55 | Temp 98.6°F | Resp 19 | Ht 64.0 in | Wt 178.0 lb

## 2021-02-08 DIAGNOSIS — Z86010 Personal history of colonic polyps: Secondary | ICD-10-CM | POA: Diagnosis not present

## 2021-02-08 DIAGNOSIS — G4733 Obstructive sleep apnea (adult) (pediatric): Secondary | ICD-10-CM | POA: Diagnosis not present

## 2021-02-08 DIAGNOSIS — I10 Essential (primary) hypertension: Secondary | ICD-10-CM | POA: Diagnosis not present

## 2021-02-08 DIAGNOSIS — F419 Anxiety disorder, unspecified: Secondary | ICD-10-CM | POA: Diagnosis not present

## 2021-02-08 MED ORDER — SODIUM CHLORIDE 0.9 % IV SOLN: 500.0000 mL | Freq: Once | INTRAVENOUS | Status: DC

## 2021-02-08 NOTE — Progress Notes (Signed)
Pt's states no medical or surgical changes since previsit or office visit. 

## 2021-02-08 NOTE — Op Note (Signed)
[facility] Patient Name: Alexis Braun Procedure Date: 02/08/2021 11:44 AM MRN: [id number] Endoscopist: Gatha Mayer , MD Age: 71 Referring MD:  Date of Birth: 07/04/1950 Gender: Female Account #: [id number] Procedure:                Colonoscopy Indications:              High risk colon cancer surveillance: Personal                            history of colonic polyps, Last colonoscopy: 2019 Medicines:                Propofol per Anesthesia, Monitored Anesthesia Care Procedure:                Pre-Anesthesia Assessment:                           - Prior to the procedure, a History and Physical                            was performed, and patient medications and                            allergies were reviewed. The patient's tolerance of                            previous anesthesia was also reviewed. The risks                            and benefits of the procedure and the sedation                            options and risks were discussed with the patient.                            All questions were answered, and informed consent                            was obtained. Prior Anticoagulants: The patient has                            taken no previous anticoagulant or antiplatelet                            agents. ASA Grade Assessment: II - A patient with                            mild systemic disease. After reviewing the risks                            and benefits, the patient was deemed in                            satisfactory condition to undergo the procedure.  After obtaining informed consent, the colonoscope                            was passed under direct vision. Throughout the                            procedure, the patient's blood pressure, pulse, and                            oxygen saturations were monitored continuously. The                            Olympus PCF-H190DL ([id number]) Colonoscope was                             introduced through the anus and advanced to the the                            cecum, identified by appendiceal orifice and                            ileocecal valve. The ileocecal valve, appendiceal                            orifice, and rectum were photographed. The quality                            of the bowel preparation was adequate. The                            colonoscopy was performed without difficulty. The                            patient tolerated the procedure well. The bowel                            preparation used was Miralax via split dose                            instruction. Scope In: 12:03:09 PM Scope Out: 12:20:42 PM Scope Withdrawal Time: 0 hours 13 minutes 41 seconds  Total Procedure Duration: 0 hours 17 minutes 33 seconds  Findings:                 The perianal and digital rectal examinations were                            normal.                           The colon (entire examined portion) appeared normal.                           No additional abnormalities were found on  retroflexion. Complications:            No immediate complications. Estimated blood loss:                            None. Estimated Blood Loss:     Estimated blood loss: none. Impression:               - The entire examined colon is normal.                           - No specimens collected.                           - Personal history of colonic polyps. 3 adenomas                            and 1 ssp 2019 Recommendation:           - Repeat colonoscopy in 5 years for surveillance.                           - Patient has a contact number available for                            emergencies. The signs and symptoms of potential                            delayed complications were discussed with the                            patient. Return to normal activities tomorrow.                            Written discharge instructions were provided to the                             patient.                           - Resume previous diet.                           - Continue present medications. Gatha Mayer, MD 02/08/2021 12:31:13 PM This report has been signed electronically.

## 2021-02-08 NOTE — Progress Notes (Signed)
C.W. vital signs. 

## 2021-02-08 NOTE — Patient Instructions (Addendum)
Normal exam today - no polyps.  Repeat in 5 years.  I appreciate the opportunity to care for you. Gatha Mayer, MD, FACG   YOU HAD AN ENDOSCOPIC PROCEDURE TODAY AT [facility]:   Refer to the procedure report that was given to you for any specific questions about what was found during the examination.  If the procedure report does not answer your questions, please call your gastroenterologist to clarify.  If you requested that your care partner not be given the details of your procedure findings, then the procedure report has been included in a sealed envelope for you to review at your convenience later.  YOU SHOULD EXPECT: Some feelings of bloating in the abdomen. Passage of more gas than usual.  Walking can help get rid of the air that was put into your GI tract during the procedure and reduce the bloating. If you had a lower endoscopy (such as a colonoscopy or flexible sigmoidoscopy) you may notice spotting of blood in your stool or on the toilet paper. If you underwent a bowel prep for your procedure, you may not have a normal bowel movement for a few days.  Please Note:  You might notice some irritation and congestion in your nose or some drainage.  This is from the oxygen used during your procedure.  There is no need for concern and it should clear up in a day or so.  SYMPTOMS TO REPORT IMMEDIATELY:  Following lower endoscopy (colonoscopy or flexible sigmoidoscopy):  Excessive amounts of blood in the stool  Significant tenderness or worsening of abdominal pains  Swelling of the abdomen that is new, acute  Fever of 100F or higher  For urgent or emergent issues, a gastroenterologist can be reached at any hour by calling [phone]. Do not use MyChart messaging for urgent concerns.    DIET:  We do recommend a small meal at first, but then you may proceed to your regular diet.  Drink plenty of fluids but you should avoid alcoholic beverages for 24  hours.  ACTIVITY:  You should plan to take it easy for the rest of today and you should NOT DRIVE or use heavy machinery until tomorrow (because of the sedation medicines used during the test).    FOLLOW UP: Our staff will call the number listed on your records 48-72 hours following your procedure to check on you and address any questions or concerns that you may have regarding the information given to you following your procedure. If we do not reach you, we will leave a message.  We will attempt to reach you two times.  During this call, we will ask if you have developed any symptoms of COVID 19. If you develop any symptoms (ie: fever, flu-like symptoms, shortness of breath, cough etc.) before then, please call [phone].  If you test positive for Covid 19 in the 2 weeks post procedure, please call and report this information to [place].     SIGNATURES/CONFIDENTIALITY: You and/or your care partner have signed paperwork which will be entered into your electronic medical record.  These signatures attest to the fact that that the information above on your After Visit Summary has been reviewed and is understood.  Full responsibility of the confidentiality of this discharge information lies with you and/or your care-partner.

## 2021-02-08 NOTE — Progress Notes (Signed)
[facility] History and Physical   Primary Care Physician:  Kathyrn Lass, MD   Reason for Procedure:   Hx colon polyps  Plan:    colonoscopy     HPI: Alexis Braun is a 71 y.o. female for polyp surveillance  3 adenomas and an ssp in 2019   Past Medical History:  Diagnosis Date   Allergy    sulfa   Anxiety    Arthritis    Bladder prolapse, female, acquired    Chest pain    per pt, over couple years   Congenital hearing loss    right   Gallbladder polyp 2014   GERD (gastroesophageal reflux disease)    Hepatitis    as teen-iv drugs   Hx of adenomatous and sessile serrated colonic polyps 12/24/2017   Hypertension    on medication   Internal hemorrhoid    Migraines    Ovarian cyst 04/03/1999   PONV (postoperative nausea and vomiting)    Positive hepatitis C antibody test    at age 18; has been told that she "self cured" No current or past treatment.   Sleep apnea    uses c-pap   Wears hearing aid    right    Past Surgical History:  Procedure Laterality Date   CHOLECYSTECTOMY N/A 10/08/2012   Procedure: LAPAROSCOPIC CHOLECYSTECTOMY WITH INTRAOPERATIVE CHOLANGIOGRAM;  Surgeon: Imogene Burn. Georgette Dover, MD;  Location: [place];  Service: General;  Laterality: N/A;   CHONDROPLASTY Right 03/31/2014   Procedure: CHONDROPLASTY;  Surgeon: Frederik Pear, MD;  Location: [place];  Service: Orthopedics;  Laterality: Right;   COLONOSCOPY     2007   HEMORRHOID BANDING  2017   KNEE ARTHROSCOPY WITH LATERAL MENISECTOMY Right 03/31/2014   Procedure: KNEE ARTHROSCOPY WITH  PARTIAL LATERAL MENISECTOMY;  Surgeon: Frederik Pear, MD;  Location: [place];  Service: Orthopedics;  Laterality: Right;   KNEE ARTHROSCOPY WITH MEDIAL MENISECTOMY Right 03/31/2014   Procedure: KNEE ARTHROSCOPY WITH PARTIALMEDIAL MENISECTOMY;  Surgeon: Frederik Pear, MD;  Location: [place];  Service: Orthopedics;  Laterality: Right;   OOPHORECTOMY  2002   Right     SIGMOIDOSCOPY     1999    Prior to Admission medications   Medication Sig Start Date End Date Taking? Authorizing Provider  atenolol (TENORMIN) 25 MG tablet Take 12.5 mg by mouth daily. 05/02/14  Yes [provider]  cyclobenzaprine (FLEXERIL) 10 MG tablet Take 10 mg by mouth 3 (three) times daily as needed for muscle spasms. 02/23/19  Yes [provider]  estradiol (ESTRACE) 0.1 MG/GM vaginal cream INSERT 1/4 APPLICATORFUL VAGINALLY 3 TIMES A WEEK 04/28/20  Yes Lavoie, Truddie Hidden, MD  Flaxseed, Linseed, (FLAX SEED OIL PO) Take 1 capsule by mouth daily.   Yes [provider]  Omega-3 Fatty Acids (FISH OIL) 1000 MG CAPS Take 1 capsule by mouth daily.   Yes [provider]  psyllium (REGULOID) 0.52 g capsule Take 0.52 g by mouth daily.   Yes [provider]  SUMAtriptan (IMITREX) 100 MG tablet Take 100 mg by mouth daily as needed for migraine. May repeat in 2 hours if headache persists or recurs.   Yes [provider]  valACYclovir (VALTREX) 500 MG tablet Take 1 tablet (500 mg total) by mouth daily. 10/22/18  Yes Princess Bruins, MD  ALPRAZolam Duanne Moron) 0.5 MG tablet Take 0.5 mg by mouth at bedtime as needed for sleep.    [provider]  aspirin EC 81 MG tablet  Take 81 mg by mouth daily.    [provider]  famotidine (PEPCID) 20 MG tablet Take 20 mg by mouth daily.    [provider]  omeprazole (PRILOSEC) 20 MG capsule Take 20 mg by mouth See admin instructions. Takes 2-3 times per week    [provider]  traZODone (DESYREL) 50 MG tablet Take 50 mg by mouth at bedtime as needed for sleep.    [provider]    Current Outpatient Medications  Medication Sig Dispense Refill   atenolol (TENORMIN) 25 MG tablet Take 12.5 mg by mouth daily.     cyclobenzaprine (FLEXERIL) 10 MG tablet Take 10 mg by mouth 3 (three) times daily as needed for muscle spasms.     estradiol (ESTRACE) 0.1 MG/GM vaginal  cream INSERT 1/4 APPLICATORFUL VAGINALLY 3 TIMES A WEEK 42.5 g 4   Flaxseed, Linseed, (FLAX SEED OIL PO) Take 1 capsule by mouth daily.     Omega-3 Fatty Acids (FISH OIL) 1000 MG CAPS Take 1 capsule by mouth daily.     psyllium (REGULOID) 0.52 g capsule Take 0.52 g by mouth daily.     SUMAtriptan (IMITREX) 100 MG tablet Take 100 mg by mouth daily as needed for migraine. May repeat in 2 hours if headache persists or recurs.     valACYclovir (VALTREX) 500 MG tablet Take 1 tablet (500 mg total) by mouth daily. 90 tablet 3   ALPRAZolam (XANAX) 0.5 MG tablet Take 0.5 mg by mouth at bedtime as needed for sleep.     aspirin EC 81 MG tablet Take 81 mg by mouth daily.     famotidine (PEPCID) 20 MG tablet Take 20 mg by mouth daily.     omeprazole (PRILOSEC) 20 MG capsule Take 20 mg by mouth See admin instructions. Takes 2-3 times per week     traZODone (DESYREL) 50 MG tablet Take 50 mg by mouth at bedtime as needed for sleep.     Current Facility-Administered Medications  Medication Dose Route Frequency Provider Last Rate Last Admin   0.9 %  sodium chloride infusion  500 mL Intravenous Once Gatha Mayer, MD        Allergies as of 02/08/2021 - Review Complete 02/08/2021  Allergen Reaction Noted   Sulfa antibiotics Anaphylaxis and Swelling 03/16/2011   Statins Other (See Comments) 05/04/2010    Family History  Problem Relation Age of Onset   Heart disease Father    Heart attack Father    Heart disease Mother    Dementia Mother    Migraines Mother    Colon cancer Neg Hx    Breast cancer Neg Hx    Esophageal cancer Neg Hx    Stomach cancer Neg Hx    Rectal cancer Neg Hx     Social History   Socioeconomic History   Marital status: Single    Spouse name: Not on file   Number of children: 0   Years of education: MA   Highest education level: Not on file  Occupational History   Occupation: Retired  Tobacco Use   Smoking status: Never   Smokeless tobacco: Never  Vaping Use   Vaping  Use: Never used  Substance and Sexual Activity   Alcohol use: Not Currently   Drug use: No    Comment: as teen   Sexual activity: Not Currently    Partners: Male  Other Topics Concern   Not on file  Social History Narrative   Patient lives at home  alone.   occ caffeine; twice a week       Review of Systems:  All other review of systems negative except as mentioned in the HPI.  Physical Exam: Vital signs BP (!) 121/50    Pulse (!) 47    Temp 98.6 F (37 C)    Resp 12    Ht 5\' 4"  (1.626 m)    Wt 178 lb (80.7 kg)    SpO2 100%    BMI 30.55 kg/m   General:   Alert,  Well-developed, well-nourished, pleasant and cooperative in NAD Lungs:  Clear throughout to auscultation.   Heart:  Regular rate and rhythm; no murmurs, clicks, rubs,  or gallops. Abdomen:  Soft, nontender and nondistended. Normal bowel sounds.   Neuro/Psych:  Alert and cooperative. Normal mood and affect. A and O x 3   @Nandana Krolikowski  Simonne Maffucci, MD, [facility] [phone] (pager) 02/08/2021 11:56 AM@

## 2021-02-08 NOTE — Progress Notes (Signed)
Report given to PACU, vss 

## 2021-02-10 ENCOUNTER — Telehealth: Payer: Self-pay | Admitting: *Deleted

## 2021-02-10 ENCOUNTER — Telehealth: Payer: Self-pay

## 2021-02-10 NOTE — Telephone Encounter (Signed)
°  Follow up Call-  Call back number 02/08/2021 05/02/2020  Post procedure Call Back phone  # [phone]  Permission to leave phone message Yes Yes  Some recent data might be hidden   LMOM to call back with any questions or concerns.  Also, call back if patient has developed fever, respiratory issues or been dx with COVID or had any family members or close contacts diagnosed since her procedure.

## 2021-02-10 NOTE — Telephone Encounter (Signed)
°  Follow up Call-  Call back number 02/08/2021 05/02/2020  Post procedure Call Back phone  # [phone]  Permission to leave phone message Yes Yes  Some recent data might be hidden   First attempt follow up call, LVM.

## 2021-02-14 ENCOUNTER — Other Ambulatory Visit: Payer: Self-pay | Admitting: Otolaryngology

## 2021-02-15 ENCOUNTER — Encounter: Payer: Self-pay | Admitting: Internal Medicine

## 2021-02-15 ENCOUNTER — Encounter: Payer: Self-pay | Admitting: Obstetrics & Gynecology

## 2021-02-16 NOTE — Telephone Encounter (Signed)
Please place referral to Dr. Maryland Pink for pelvic organ prolapse for evaluation and treatment.

## 2021-02-16 NOTE — Telephone Encounter (Signed)
Dr. Dellis Filbert patient.

## 2021-02-28 ENCOUNTER — Other Ambulatory Visit: Payer: Self-pay

## 2021-02-28 ENCOUNTER — Encounter (HOSPITAL_BASED_OUTPATIENT_CLINIC_OR_DEPARTMENT_OTHER): Payer: Self-pay | Admitting: Otolaryngology

## 2021-02-28 DIAGNOSIS — R202 Paresthesia of skin: Secondary | ICD-10-CM | POA: Diagnosis not present

## 2021-02-28 DIAGNOSIS — I700 Atherosclerosis of aorta: Secondary | ICD-10-CM | POA: Diagnosis not present

## 2021-02-28 DIAGNOSIS — E7800 Pure hypercholesterolemia, unspecified: Secondary | ICD-10-CM | POA: Diagnosis not present

## 2021-03-01 ENCOUNTER — Encounter (HOSPITAL_BASED_OUTPATIENT_CLINIC_OR_DEPARTMENT_OTHER)
Admission: RE | Admit: 2021-03-01 | Discharge: 2021-03-01 | Disposition: A | Discharge status: Home / Self Care | Payer: Medicare Other | Source: Ambulatory Visit | Attending: Otolaryngology | Admitting: Otolaryngology

## 2021-03-01 DIAGNOSIS — Z01810 Encounter for preprocedural cardiovascular examination: Secondary | ICD-10-CM | POA: Insufficient documentation

## 2021-03-02 ENCOUNTER — Other Ambulatory Visit: Payer: Self-pay | Admitting: Family Medicine

## 2021-03-02 DIAGNOSIS — E7800 Pure hypercholesterolemia, unspecified: Secondary | ICD-10-CM

## 2021-03-08 ENCOUNTER — Ambulatory Visit (HOSPITAL_BASED_OUTPATIENT_CLINIC_OR_DEPARTMENT_OTHER): Payer: Medicare Other | Admitting: Certified Registered"

## 2021-03-08 ENCOUNTER — Encounter (HOSPITAL_BASED_OUTPATIENT_CLINIC_OR_DEPARTMENT_OTHER): Payer: Self-pay | Admitting: Otolaryngology

## 2021-03-08 ENCOUNTER — Ambulatory Visit (HOSPITAL_BASED_OUTPATIENT_CLINIC_OR_DEPARTMENT_OTHER)
Admission: RE | Admit: 2021-03-08 | Discharge: 2021-03-08 | Disposition: A | Discharge status: Home / Self Care | Payer: Medicare Other | Attending: Otolaryngology | Admitting: Otolaryngology

## 2021-03-08 ENCOUNTER — Encounter (HOSPITAL_BASED_OUTPATIENT_CLINIC_OR_DEPARTMENT_OTHER)
Admission: RE | Disposition: A | Discharge status: Home / Self Care | Payer: Self-pay | Source: Home / Self Care | Attending: Otolaryngology

## 2021-03-08 ENCOUNTER — Other Ambulatory Visit: Payer: Self-pay

## 2021-03-08 DIAGNOSIS — F419 Anxiety disorder, unspecified: Secondary | ICD-10-CM

## 2021-03-08 DIAGNOSIS — I10 Essential (primary) hypertension: Secondary | ICD-10-CM | POA: Diagnosis not present

## 2021-03-08 DIAGNOSIS — Z9989 Dependence on other enabling machines and devices: Secondary | ICD-10-CM

## 2021-03-08 DIAGNOSIS — G4733 Obstructive sleep apnea (adult) (pediatric): Secondary | ICD-10-CM | POA: Insufficient documentation

## 2021-03-08 DIAGNOSIS — M1990 Unspecified osteoarthritis, unspecified site: Secondary | ICD-10-CM | POA: Diagnosis not present

## 2021-03-08 DIAGNOSIS — Z79899 Other long term (current) drug therapy: Secondary | ICD-10-CM | POA: Diagnosis not present

## 2021-03-08 DIAGNOSIS — K219 Gastro-esophageal reflux disease without esophagitis: Secondary | ICD-10-CM | POA: Insufficient documentation

## 2021-03-08 HISTORY — PX: DRUG INDUCED ENDOSCOPY: SHX6808

## 2021-03-08 SURGERY — DRUG INDUCED SLEEP ENDOSCOPY
Anesthesia: General

## 2021-03-08 MED ORDER — PROPOFOL 500 MG/50ML IV EMUL
INTRAVENOUS | Status: DC | PRN
  Administered 2021-03-08: 35 ug/kg/min via INTRAVENOUS

## 2021-03-08 MED ORDER — LIDOCAINE 2% (20 MG/ML) 5 ML SYRINGE
INTRAMUSCULAR | Status: DC | PRN
  Administered 2021-03-08: 60 mg via INTRAVENOUS

## 2021-03-08 MED ORDER — OXYMETAZOLINE HCL 0.05 % NA SOLN
NASAL | Status: DC | PRN
  Administered 2021-03-08: 1 via TOPICAL

## 2021-03-08 MED ORDER — LACTATED RINGERS IV SOLN: INTRAVENOUS | Status: DC

## 2021-03-08 MED ORDER — ONDANSETRON HCL 4 MG/2ML IJ SOLN
INTRAMUSCULAR | Status: DC | PRN
  Administered 2021-03-08: 4 mg via INTRAVENOUS

## 2021-03-08 SURGICAL SUPPLY — 13 items
CANISTER SUCT 1200ML W/VALVE (MISCELLANEOUS) ×2 IMPLANT
GLOVE SURG ENC MOIS LTX SZ7.5 (GLOVE) ×2 IMPLANT
KIT CLEAN ENDO (MISCELLANEOUS) ×2 IMPLANT
NDL HYPO 27GX1-1/4 (NEEDLE) IMPLANT
NEEDLE HYPO 27GX1-1/4 (NEEDLE) IMPLANT
PACK BASIN DAY SURGERY FS (CUSTOM PROCEDURE TRAY) ×2 IMPLANT
PATTIES SURGICAL .5 X3 (DISPOSABLE) ×2 IMPLANT
SHEET MEDIUM DRAPE 40X70 STRL (DRAPES) IMPLANT
SOL ANTI FOG 6CC (MISCELLANEOUS) ×1 IMPLANT
SOLUTION ANTI FOG 6CC (MISCELLANEOUS) ×1
SYR CONTROL 10ML LL (SYRINGE) IMPLANT
TOWEL GREEN STERILE FF (TOWEL DISPOSABLE) ×2 IMPLANT
TUBE CONNECTING 20X1/4 (TUBING) ×2 IMPLANT

## 2021-03-08 NOTE — H&P (Signed)
Alexis Braun is an 71 y.o. female.   Chief Complaint: Sleep apnea HPI: 71 year old female with obstructive sleep apnea who has been unable to tolerate CPAP.  Past Medical History:  Diagnosis Date   Allergy    sulfa   Anxiety    Arthritis    Bladder prolapse, female, acquired    Chest pain    per pt, over couple years   Congenital hearing loss    right   Gallbladder polyp 2014   GERD (gastroesophageal reflux disease)    Hepatitis    as teen-iv drugs   Hx of adenomatous and sessile serrated colonic polyps 12/24/2017   Hypertension    on medication   Internal hemorrhoid    Migraines    Ovarian cyst 04/03/1999   PONV (postoperative nausea and vomiting)    Positive hepatitis C antibody test    at age 45; has been told that she "self cured" No current or past treatment.   Sleep apnea    uses c-pap   Wears hearing aid    right    Past Surgical History:  Procedure Laterality Date   CHOLECYSTECTOMY N/A 10/08/2012   Procedure: LAPAROSCOPIC CHOLECYSTECTOMY WITH INTRAOPERATIVE CHOLANGIOGRAM;  Surgeon: Imogene Burn. Georgette Dover, MD;  Location: [place];  Service: General;  Laterality: N/A;   CHONDROPLASTY Right 03/31/2014   Procedure: CHONDROPLASTY;  Surgeon: Frederik Pear, MD;  Location: [place];  Service: Orthopedics;  Laterality: Right;   COLONOSCOPY     2007   HEMORRHOID BANDING  2017   KNEE ARTHROSCOPY WITH LATERAL MENISECTOMY Right 03/31/2014   Procedure: KNEE ARTHROSCOPY WITH  PARTIAL LATERAL MENISECTOMY;  Surgeon: Frederik Pear, MD;  Location: [place];  Service: Orthopedics;  Laterality: Right;   KNEE ARTHROSCOPY WITH MEDIAL MENISECTOMY Right 03/31/2014   Procedure: KNEE ARTHROSCOPY WITH PARTIALMEDIAL MENISECTOMY;  Surgeon: Frederik Pear, MD;  Location: [place];  Service: Orthopedics;  Laterality: Right;   OOPHORECTOMY  2002   Right    SIGMOIDOSCOPY     1999    Family History  Problem Relation Age of Onset   Heart disease Father     Heart attack Father    Heart disease Mother    Dementia Mother    Migraines Mother    Colon cancer Neg Hx    Breast cancer Neg Hx    Esophageal cancer Neg Hx    Stomach cancer Neg Hx    Rectal cancer Neg Hx    Social History:  reports that she has never smoked. She has never used smokeless tobacco. She reports that she does not currently use alcohol. She reports that she does not use drugs.  Allergies:  Allergies  Allergen Reactions   Sulfa Antibiotics Anaphylaxis and Swelling    Nose & throat swells   Statins Other (See Comments)    History of Hepatitis C Patient will not take them    Medications Prior to Admission  Medication Sig Dispense Refill   ALPRAZolam (XANAX) 0.5 MG tablet Take 0.5 mg by mouth at bedtime as needed for sleep.     aspirin EC 81 MG tablet Take 81 mg by mouth daily.     atenolol (TENORMIN) 25 MG tablet Take 12.5 mg by mouth daily.     cyclobenzaprine (FLEXERIL) 10 MG tablet Take 10 mg by mouth 3 (three) times daily as needed for muscle spasms.     estradiol (ESTRACE) 0.1 MG/GM vaginal cream INSERT 1/4 APPLICATORFUL VAGINALLY 3 TIMES A WEEK  42.5 g 4   famotidine (PEPCID) 20 MG tablet Take 20 mg by mouth daily.     Flaxseed, Linseed, (FLAX SEED OIL PO) Take 1 capsule by mouth daily.     Omega-3 Fatty Acids (FISH OIL) 1000 MG CAPS Take 1 capsule by mouth daily.     omeprazole (PRILOSEC) 20 MG capsule Take 20 mg by mouth See admin instructions. Takes 2-3 times per week     psyllium (REGULOID) 0.52 g capsule Take 0.52 g by mouth daily.     SUMAtriptan (IMITREX) 100 MG tablet Take 100 mg by mouth daily as needed for migraine. May repeat in 2 hours if headache persists or recurs.     traZODone (DESYREL) 50 MG tablet Take 50 mg by mouth at bedtime as needed for sleep.     valACYclovir (VALTREX) 500 MG tablet Take 1 tablet (500 mg total) by mouth daily. 90 tablet 3    No results found for this or any previous visit (from the past 48 hour(s)). No results  found.  Review of Systems  All other systems reviewed and are negative.  Blood pressure 114/67, pulse (!) 55, temperature 97.8 F (36.6 C), temperature source Oral, resp. rate 14, height 5\' 4"  (1.626 m), weight 82.1 kg, SpO2 99 %. Physical Exam Constitutional:      Appearance: Normal appearance. She is normal weight.  HENT:     Head: Normocephalic and atraumatic.     Right Ear: External ear normal.     Left Ear: External ear normal.     Nose: Nose normal.     Mouth/Throat:     Mouth: Mucous membranes are moist.     Pharynx: Oropharynx is clear.  Eyes:     Extraocular Movements: Extraocular movements intact.     Conjunctiva/sclera: Conjunctivae normal.     Pupils: Pupils are equal, round, and reactive to light.  Cardiovascular:     Rate and Rhythm: Normal rate.  Pulmonary:     Effort: Pulmonary effort is normal.  Musculoskeletal:     Cervical back: Normal range of motion.  Skin:    General: Skin is warm and dry.  Neurological:     General: No focal deficit present.     Mental Status: She is alert and oriented to person, place, and time.  Psychiatric:        Mood and Affect: Mood normal.        Behavior: Behavior normal.        Thought Content: Thought content normal.        Judgment: Judgment normal.     Assessment/Plan Obstructive sleep apnea and BMI 31.07.  To OR for sleep endoscopy.  Melida Quitter, MD 03/08/2021, 8:30 AM

## 2021-03-08 NOTE — Discharge Instructions (Signed)

## 2021-03-08 NOTE — Op Note (Signed)
Preop diagnosis: Obstructive sleep apnea Postop diagnosis: same Procedure: Drug-induced sleep endoscopy Surgeon: Redmond Baseman Anesth: IV sedation Compl: None Findings: There is 75% anterior-posterior collapse at the velum making her a candidate for hypoglossal nerve stimulator placement.  There was little collapse at the tongue base. Description:  After discussing risks, benefits, and alternatives, the patient was brought to the operative suite and placed on the operative table in the supine position.  Anesthesia was induced and the patient was given light sedation to simulate natural sleep. When the proper level was reached, an Afrin-soaked pledget was placed in the right nasal passage for a couple of minutes and then removed.  The fiberoptic laryngoscope was then passed to view the pharynx and larynx.  Findings are noted above and the exam was recorded.  After completion, the scope was removed and the patient was returned to anesthesia for wakeup and was moved to the recovery room in stable condition.

## 2021-03-08 NOTE — Brief Op Note (Signed)
03/08/2021  8:46 AM  PATIENT:  Alexis Braun  71 y.o. female  PRE-OPERATIVE DIAGNOSIS:  obstructive sleep apnea  POST-OPERATIVE DIAGNOSIS:  obstructive sleep apnea  PROCEDURE:  Procedure(s): DRUG INDUCED SLEEP ENDOSCOPY (N/A)  SURGEON:  Surgeon(s) and Role:    Melida Quitter, MD - Primary  PHYSICIAN ASSISTANT:   ASSISTANTS: none   ANESTHESIA:   IV sedation  EBL:  None   BLOOD ADMINISTERED:none  DRAINS: none   LOCAL MEDICATIONS USED:  NONE  SPECIMEN:  No Specimen  DISPOSITION OF SPECIMEN:  N/A  COUNTS:  YES  TOURNIQUET:  * No tourniquets in log *  DICTATION: .Note written in EPIC  PLAN OF CARE: Discharge to home after PACU  PATIENT DISPOSITION:  PACU - hemodynamically stable.   Delay start of Pharmacological VTE agent (>24hrs) due to surgical blood loss or risk of bleeding: no

## 2021-03-08 NOTE — Anesthesia Preprocedure Evaluation (Addendum)
Anesthesia Evaluation  Patient identified by MRN, date of birth, ID band Patient awake    Reviewed: Allergy & Precautions, NPO status , Patient's Chart, lab work & pertinent test results, reviewed documented beta blocker date and time   History of Anesthesia Complications (+) PONV  Airway Mallampati: III  TM Distance: >3 FB Neck ROM: Full  Mouth opening: Limited Mouth Opening  Dental no notable dental hx. (+) Teeth Intact, Dental Advisory Given   Pulmonary sleep apnea and Continuous Positive Airway Pressure Ventilation ,    Pulmonary exam normal breath sounds clear to auscultation       Cardiovascular hypertension, Pt. on home beta blockers and Pt. on medications + angina Normal cardiovascular exam Rhythm:Regular Rate:Normal  TTE 2018 Normal EF, trace MR  Stress Test 2018 ETT with good exercise tolerance (10:00); no chest pain; hypertensive BP; no diagnostic ST changes; negative adequate ETT; Duke treadmill score 10.   Neuro/Psych  Headaches, PSYCHIATRIC DISORDERS Anxiety    GI/Hepatic GERD  ,(+) Hepatitis -, C  Endo/Other  negative endocrine ROS  Renal/GU negative Renal ROS  negative genitourinary   Musculoskeletal  (+) Arthritis ,   Abdominal   Peds  Hematology negative hematology ROS (+)   Anesthesia Other Findings   Reproductive/Obstetrics                          Anesthesia Physical Anesthesia Plan  ASA: 3  Anesthesia Plan: General   Post-op Pain Management: Minimal or no pain anticipated   Induction: Intravenous  PONV Risk Score and Plan: 4 or greater and Propofol infusion and Treatment may vary due to age or medical condition  Airway Management Planned: Natural Airway  Additional Equipment:   Intra-op Plan:   Post-operative Plan:   Informed Consent: I have reviewed the patients History and Physical, chart, labs and discussed the procedure including the risks, benefits  and alternatives for the proposed anesthesia with the patient or authorized representative who has indicated his/her understanding and acceptance.     Dental advisory given  Plan Discussed with: CRNA  Anesthesia Plan Comments:         Anesthesia Quick Evaluation

## 2021-03-08 NOTE — Anesthesia Postprocedure Evaluation (Signed)
Anesthesia Post Note  Patient: Alexis Braun  Procedure(s) Performed: DRUG INDUCED SLEEP ENDOSCOPY     Patient location during evaluation: PACU Anesthesia Type: General Level of consciousness: awake and alert Pain management: pain level controlled Vital Signs Assessment: post-procedure vital signs reviewed and stable Respiratory status: spontaneous breathing, nonlabored ventilation, respiratory function stable and patient connected to nasal cannula oxygen Cardiovascular status: blood pressure returned to baseline and stable Postop Assessment: no apparent nausea or vomiting Anesthetic complications: no   No notable events documented.  Last Vitals:  Vitals:   03/08/21 0757 03/08/21 0855  BP: 114/67 102/63  Pulse: (!) 55 (!) 59  Resp: 14 16  Temp: 36.6 C 36.5 C  SpO2: 99% 98%    Last Pain:  Vitals:   03/08/21 0855  TempSrc:   PainSc: 0-No pain                 Tylar Merendino L Taelor Waymire

## 2021-03-08 NOTE — Transfer of Care (Signed)
Immediate Anesthesia Transfer of Care Note  Patient: Alexis Braun  Procedure(s) Performed: DRUG INDUCED SLEEP ENDOSCOPY  Patient Location: PACU  Anesthesia Type:MAC  Level of Consciousness: awake, alert , oriented and patient cooperative  Airway & Oxygen Therapy: Patient Spontanous Breathing and Patient connected to face mask oxygen  Post-op Assessment: Report given to RN and Post -op Vital signs reviewed and stable  Post vital signs: Reviewed and stable  Last Vitals:  Vitals Value Taken Time  BP    Temp    Pulse    Resp    SpO2      Last Pain:  Vitals:   03/08/21 0757  TempSrc: Oral  PainSc: 0-No pain         Complications: No notable events documented.

## 2021-03-09 ENCOUNTER — Encounter (HOSPITAL_BASED_OUTPATIENT_CLINIC_OR_DEPARTMENT_OTHER): Payer: Self-pay | Admitting: Otolaryngology

## 2021-03-09 DIAGNOSIS — N393 Stress incontinence (female) (male): Secondary | ICD-10-CM | POA: Diagnosis not present

## 2021-03-09 DIAGNOSIS — N952 Postmenopausal atrophic vaginitis: Secondary | ICD-10-CM | POA: Diagnosis not present

## 2021-03-09 DIAGNOSIS — N812 Incomplete uterovaginal prolapse: Secondary | ICD-10-CM | POA: Diagnosis not present

## 2021-03-09 DIAGNOSIS — Z960 Presence of urogenital implants: Secondary | ICD-10-CM | POA: Diagnosis not present

## 2021-03-10 ENCOUNTER — Encounter: Payer: Self-pay | Admitting: Internal Medicine

## 2021-03-15 DIAGNOSIS — M1711 Unilateral primary osteoarthritis, right knee: Secondary | ICD-10-CM | POA: Diagnosis not present

## 2021-04-03 ENCOUNTER — Ambulatory Visit
Admission: RE | Admit: 2021-04-03 | Discharge: 2021-04-03 | Disposition: A | Discharge status: Home / Self Care | Payer: No Typology Code available for payment source | Source: Ambulatory Visit | Attending: Family Medicine | Admitting: Family Medicine

## 2021-04-03 DIAGNOSIS — E7800 Pure hypercholesterolemia, unspecified: Secondary | ICD-10-CM

## 2021-04-17 ENCOUNTER — Institutional Professional Consult (permissible substitution): Payer: Medicare Other | Admitting: Neurology

## 2021-04-19 DIAGNOSIS — G5721 Lesion of femoral nerve, right lower limb: Secondary | ICD-10-CM | POA: Diagnosis not present

## 2021-04-19 DIAGNOSIS — E785 Hyperlipidemia, unspecified: Secondary | ICD-10-CM | POA: Diagnosis not present

## 2021-04-19 DIAGNOSIS — G250 Essential tremor: Secondary | ICD-10-CM | POA: Diagnosis not present

## 2021-04-19 DIAGNOSIS — I10 Essential (primary) hypertension: Secondary | ICD-10-CM | POA: Diagnosis not present

## 2021-04-28 IMAGING — MG DIGITAL SCREENING BILATERAL MAMMOGRAM WITH TOMO AND CAD
8 series · 8 of 24 positions shown · non-contrast
Comparison: Previous exam(s).

CLINICAL DATA: Screening.

EXAM:
DIGITAL SCREENING BILATERAL MAMMOGRAM WITH TOMO AND CAD

[L CC synth-2D]
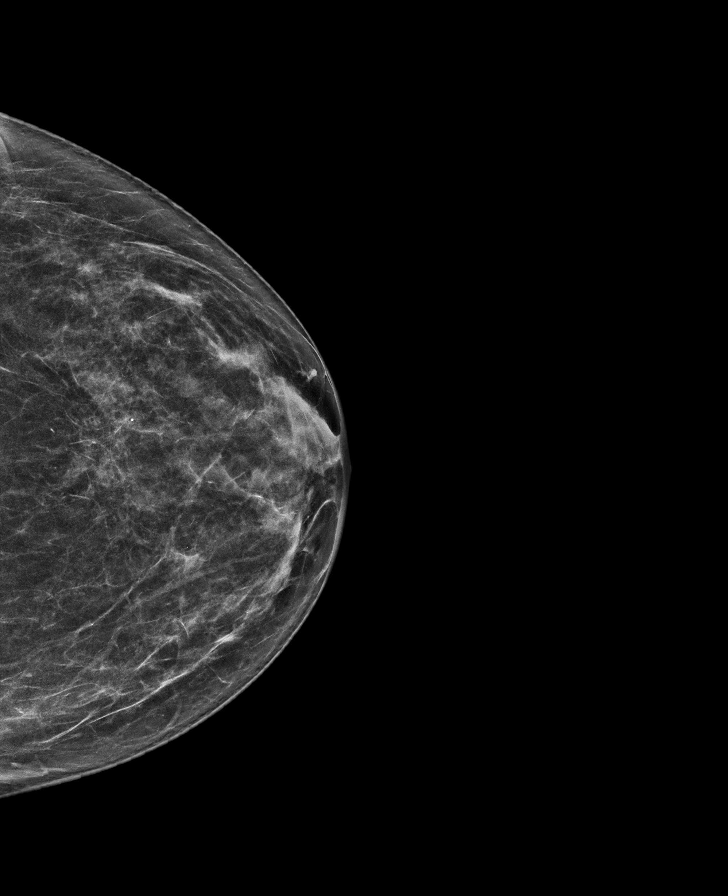

[L MLO synth-2D]
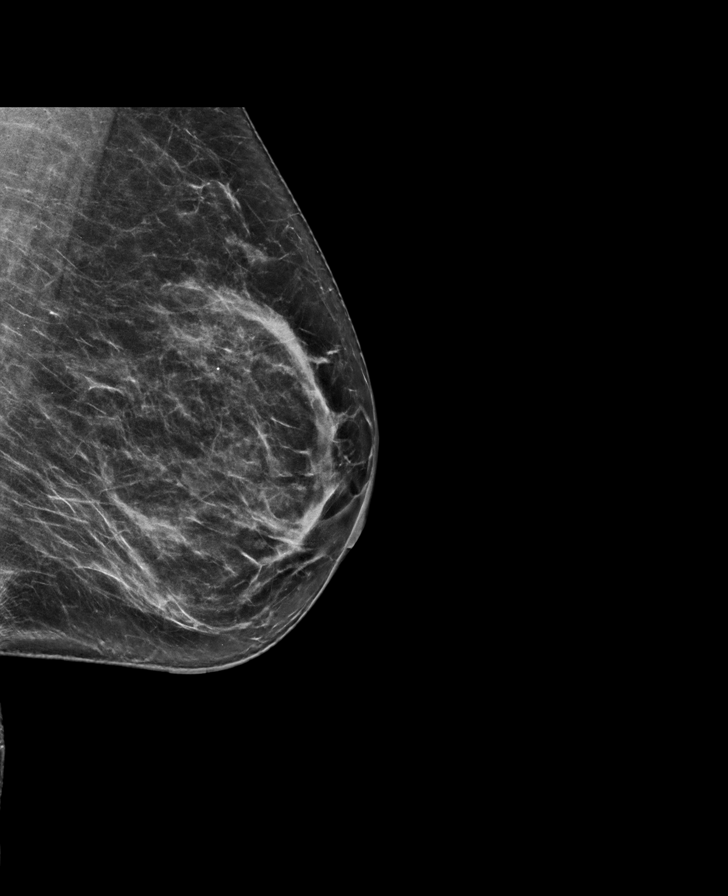

[R CC synth-2D]
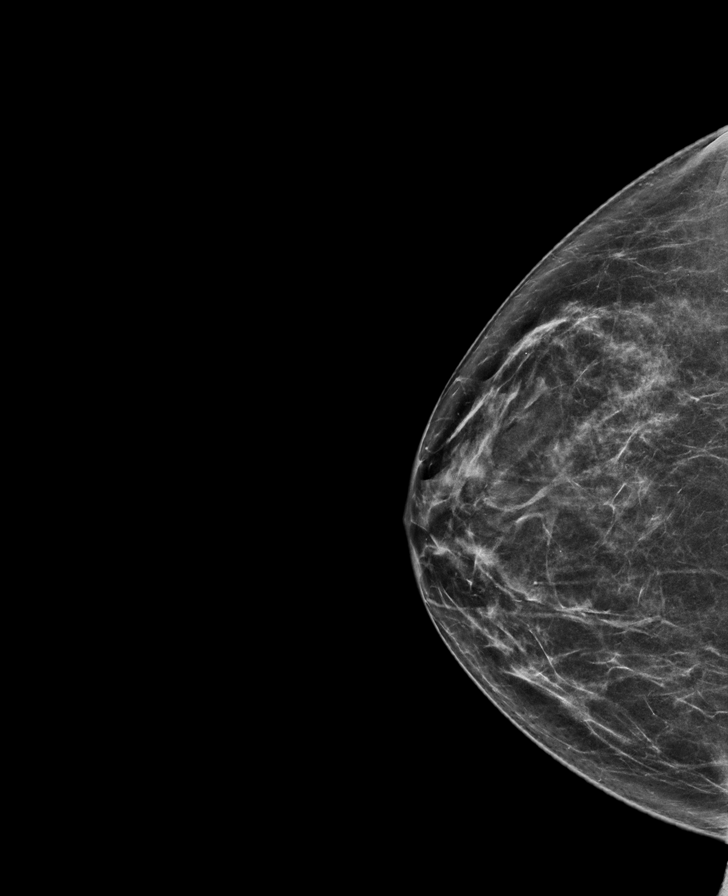

[R MLO synth-2D]
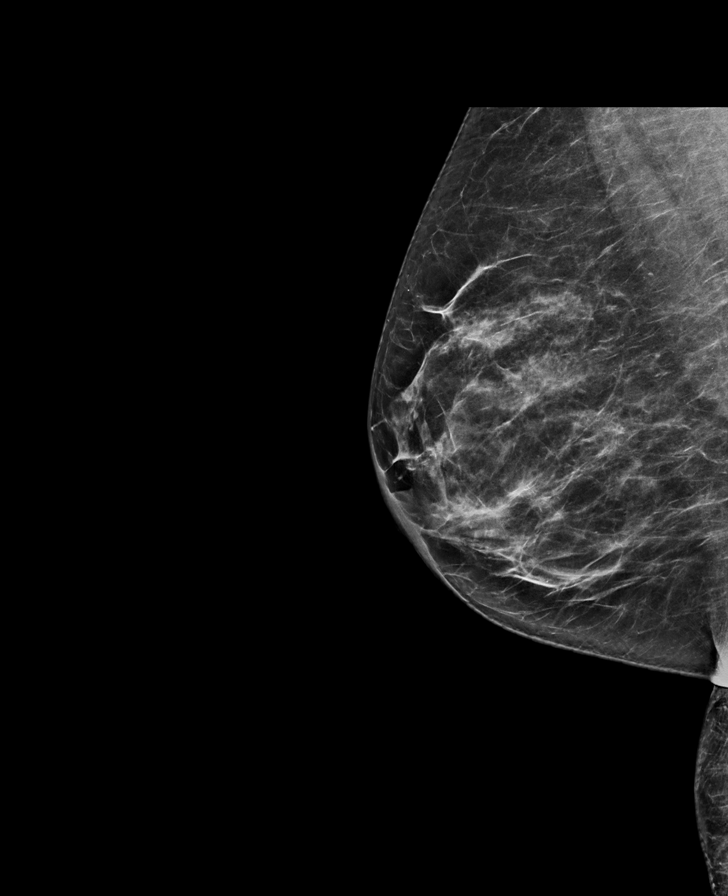

[R MLO tomo · tomo slice 37/74.0]
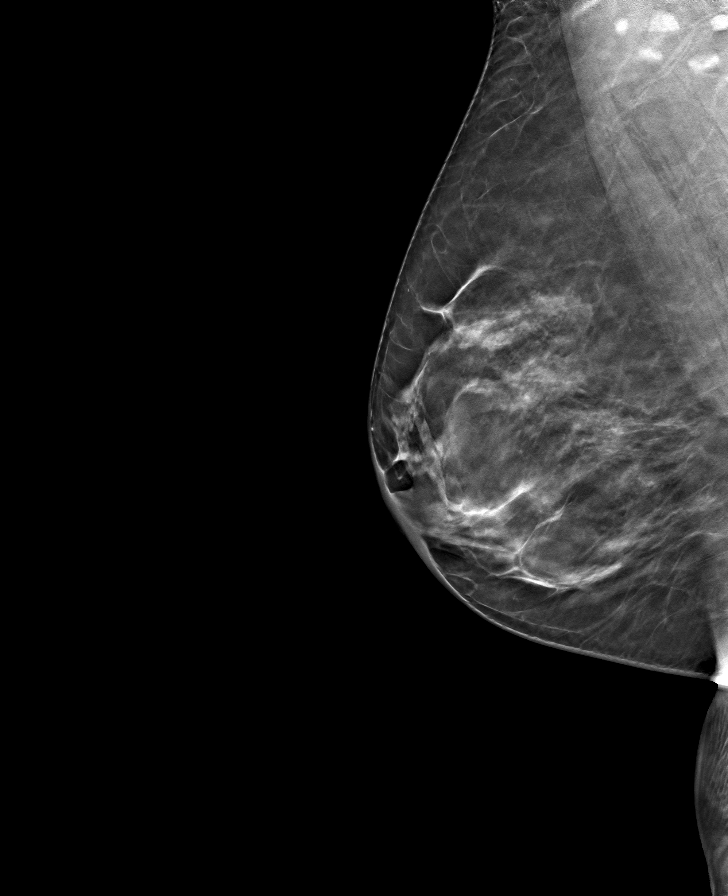

[L CC tomo · tomo slice 34/67.0]
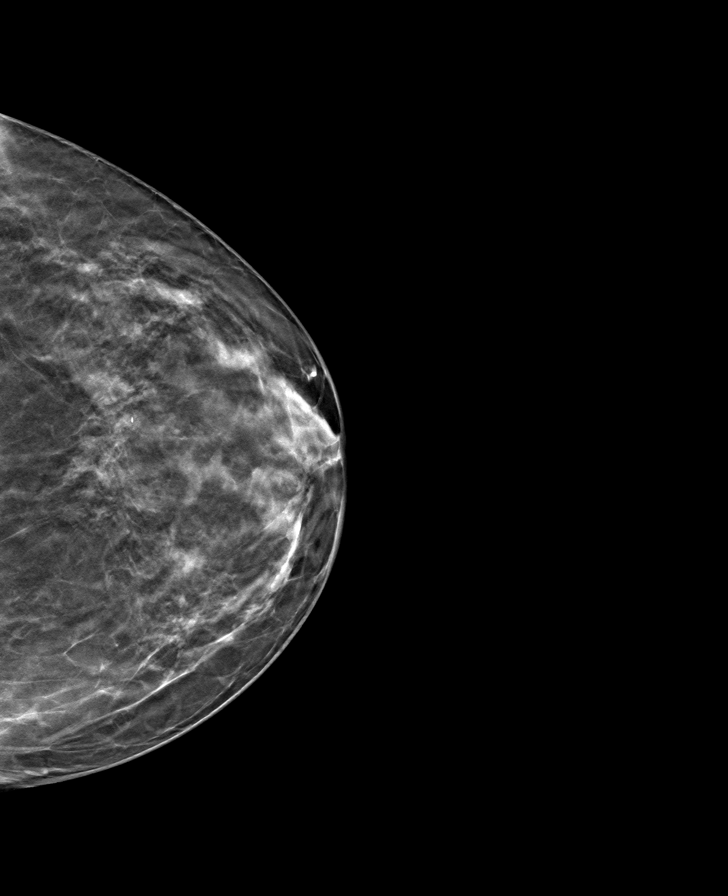

[L MLO tomo · tomo slice 39/76.0]
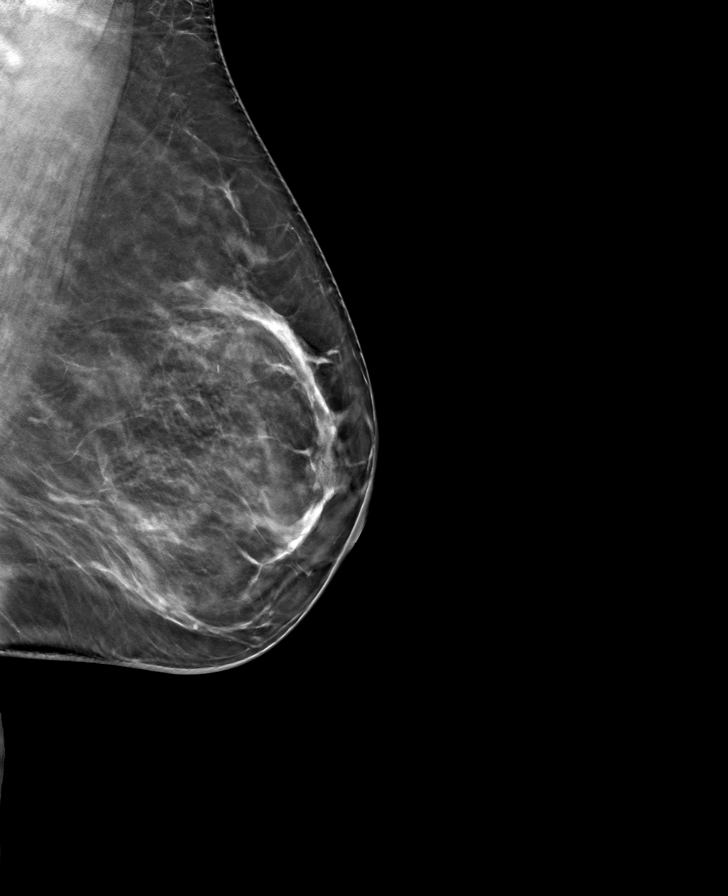

[R CC tomo · tomo slice 35/69.0]
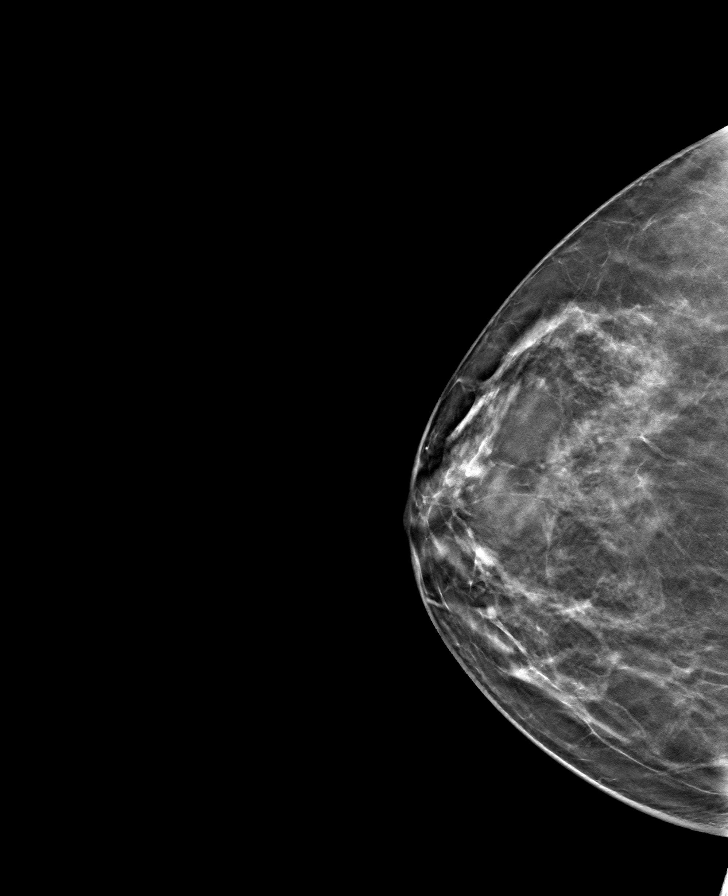

[8 of 24 positions shown; findings below may reference images not displayed]

ACR Breast Density Category c: The breast tissue is heterogeneously
dense, which may obscure small masses.
FINDINGS: There are no findings suspicious for malignancy. Images were
processed with CAD.
IMPRESSION: No mammographic evidence of malignancy. A result letter of this
screening mammogram will be mailed directly to the patient.

RECOMMENDATION:
Screening mammogram in one year. (Code:FT-U-LHB)

BI-RADS CATEGORY  1: Negative.

## 2021-05-02 DIAGNOSIS — H02132 Senile ectropion of right lower eyelid: Secondary | ICD-10-CM | POA: Diagnosis not present

## 2021-05-02 DIAGNOSIS — H02403 Unspecified ptosis of bilateral eyelids: Secondary | ICD-10-CM | POA: Diagnosis not present

## 2021-05-02 DIAGNOSIS — H02135 Senile ectropion of left lower eyelid: Secondary | ICD-10-CM | POA: Diagnosis not present

## 2021-05-02 DIAGNOSIS — H04123 Dry eye syndrome of bilateral lacrimal glands: Secondary | ICD-10-CM | POA: Diagnosis not present

## 2021-05-05 NOTE — Progress Notes (Deleted)
Cardiology Office Note:    Date:  05/05/2021   ID:  [name], [name] 1950/04/25, MRN [id number]  PCP:  Kathyrn Lass, MD   [facility] Providers Cardiologist:  None { Click to update primary MD,subspecialty MD or APP then REFRESH:1}    Referring MD: Kathyrn Lass, MD   No chief complaint on file. ***  History of Present Illness:    Alexis Braun is a 71 y.o. female with a hx of ***  Past Medical History:  Diagnosis Date   Allergy    sulfa   Anxiety    Arthritis    Bladder prolapse, female, acquired    Chest pain    per pt, over couple years   Congenital hearing loss    right   Gallbladder polyp 2014   GERD (gastroesophageal reflux disease)    Hepatitis    as teen-iv drugs   Hx of adenomatous and sessile serrated colonic polyps 12/24/2017   Hypertension    on medication   Internal hemorrhoid    Migraines    Ovarian cyst 04/03/1999   PONV (postoperative nausea and vomiting)    Positive hepatitis C antibody test    at age 60; has been told that she "self cured" No current or past treatment.   Sleep apnea    uses c-pap   Wears hearing aid    right    Past Surgical History:  Procedure Laterality Date   CHOLECYSTECTOMY N/A 10/08/2012   Procedure: LAPAROSCOPIC CHOLECYSTECTOMY WITH INTRAOPERATIVE CHOLANGIOGRAM;  Surgeon: Imogene Burn. Georgette Dover, MD;  Location: [place];  Service: General;  Laterality: N/A;   CHONDROPLASTY Right 03/31/2014   Procedure: CHONDROPLASTY;  Surgeon: Frederik Pear, MD;  Location: [place];  Service: Orthopedics;  Laterality: Right;   COLONOSCOPY     2007   DRUG INDUCED ENDOSCOPY N/A 03/08/2021   Procedure: DRUG INDUCED SLEEP ENDOSCOPY;  Surgeon: Melida Quitter, MD;  Location: [place];  Service: ENT;  Laterality: N/A;   HEMORRHOID BANDING  2017   KNEE ARTHROSCOPY WITH LATERAL MENISECTOMY Right 03/31/2014   Procedure: KNEE ARTHROSCOPY WITH  PARTIAL LATERAL MENISECTOMY;  Surgeon: Frederik Pear, MD;  Location: [place];  Service: Orthopedics;  Laterality: Right;   KNEE ARTHROSCOPY WITH MEDIAL MENISECTOMY Right 03/31/2014   Procedure: KNEE ARTHROSCOPY WITH PARTIALMEDIAL MENISECTOMY;  Surgeon: Frederik Pear, MD;  Location: [place];  Service: Orthopedics;  Laterality: Right;   OOPHORECTOMY  2002   Right    SIGMOIDOSCOPY     1999    Current Medications: No outpatient medications have been marked as taking for the 05/09/21 encounter (Appointment) with Freada Bergeron, MD.     Allergies:   Sulfa antibiotics and Statins   Social History   Socioeconomic History   Marital status: Single    Spouse name: Not on file   Number of children: 0   Years of education: MA   Highest education level: Not on file  Occupational History   Occupation: Retired  Tobacco Use   Smoking status: Never   Smokeless tobacco: Never  Vaping Use   Vaping Use: Never used  Substance and Sexual Activity   Alcohol use: Not Currently   Drug use: No    Comment: as teen   Sexual activity: Not Currently    Partners: Male  Other Topics Concern   Not on file  Social History Narrative   Patient lives at home alone.   occ caffeine; twice a week  Social Determinants of Health   Financial Resource Strain: Not on file  Food Insecurity: Not on file  Transportation Needs: Not on file  Physical Activity: Not on file  Stress: Not on file  Social Connections: Not on file     Family History: The patient's ***family history includes Dementia in her mother; Heart attack in her father; Heart disease in her father and mother; Migraines in her mother. There is no history of Colon cancer, Breast cancer, Esophageal cancer, Stomach cancer, or Rectal cancer.  ROS:   Please see the history of present illness.    *** All other systems reviewed and are negative.  EKGs/Labs/Other Studies Reviewed:    The following studies were reviewed today: ***  EKG:  EKG is *** ordered today.  The ekg ordered  today demonstrates ***  Recent Labs: No results found for requested labs within last 8760 hours.  Recent Lipid Panel No results found for: CHOL, TRIG, HDL, CHOLHDL, VLDL, LDLCALC, LDLDIRECT   Risk Assessment/Calculations:   {Does this patient have ATRIAL FIBRILLATION?[phone]}       Physical Exam:    VS:  There were no vitals taken for this visit.    Wt Readings from Last 3 Encounters:  03/08/21 181 lb (82.1 kg)  02/08/21 178 lb (80.7 kg)  02/03/21 178 lb 9.6 oz (81 kg)     GEN: *** Well nourished, well developed in no acute distress HEENT: Normal NECK: No JVD; No carotid bruits LYMPHATICS: No lymphadenopathy CARDIAC: ***RRR, no murmurs, rubs, gallops RESPIRATORY:  Clear to auscultation without rales, wheezing or rhonchi  ABDOMEN: Soft, non-tender, non-distended MUSCULOSKELETAL:  No edema; No deformity  SKIN: Warm and dry NEUROLOGIC:  Alert and oriented x 3 PSYCHIATRIC:  Normal affect   ASSESSMENT:    No diagnosis found. PLAN:    In order of problems listed above:  ***      {Are you ordering a CV Procedure (e.g. stress test, cath, DCCV, TEE, etc)?   Press F2        :[id number]}    Medication Adjustments/Labs and Tests Ordered: Current medicines are reviewed at length with the patient today.  Concerns regarding medicines are outlined above.  No orders of the defined types were placed in this encounter.  No orders of the defined types were placed in this encounter.   There are no Patient Instructions on file for this visit.   Signed, Freada Bergeron, MD  05/05/2021 3:10 PM    [place]

## 2021-05-08 NOTE — Progress Notes (Deleted)
?Cardiology Office Note:   ? ?Date:  05/08/2021  ? ?ID:  Alexis Braun, DOB 1950/09/24, MRN [id number] ? ?PCP:  Kathyrn Lass, MD ?  ?[facility] Providers ?Cardiologist:  None { ? ?Referring MD: Kathyrn Lass, MD  ? ? ?History of Present Illness:   ? ?Alexis Braun is a 71 y.o. female with a hx of anxiety, GERD and HTN who was referred by Dr. Sabra Heck for further evaluation of chest pain. ? ?Was last seen in 2018 by Dr. Johnsie Cancel for chest pain and palpitations. TTE 05/2016 showed normal BiV function and no significant valve disease. Cardiac monitor 04/2016 showed occasional PACs but no arrhythmias. ETT 04/2016 showed hypertensive response during exercise with no evidence of ischemia. 10METs.  ? ?Today, **** ? ?Past Medical History:  ?Diagnosis Date  ? Allergy   ? sulfa  ? Anxiety   ? Arthritis   ? Bladder prolapse, female, acquired   ? Chest pain   ? per pt, over couple years  ? Congenital hearing loss   ? right  ? Gallbladder polyp 2014  ? GERD (gastroesophageal reflux disease)   ? Hepatitis   ? as teen-iv drugs  ? Hx of adenomatous and sessile serrated colonic polyps 12/24/2017  ? Hypertension   ? on medication  ? Internal hemorrhoid   ? Migraines   ? Ovarian cyst 04/03/1999  ? PONV (postoperative nausea and vomiting)   ? Positive hepatitis C antibody test   ? at age 72; has been told that she "self cured" No current or past treatment.  ? Sleep apnea   ? uses c-pap  ? Wears hearing aid   ? right  ? ? ?Past Surgical History:  ?Procedure Laterality Date  ? CHOLECYSTECTOMY N/A 10/08/2012  ? Procedure: LAPAROSCOPIC CHOLECYSTECTOMY WITH INTRAOPERATIVE CHOLANGIOGRAM;  Surgeon: Imogene Burn. Georgette Dover, MD;  Location: [place];  Service: General;  Laterality: N/A;  ? CHONDROPLASTY Right 03/31/2014  ? Procedure: CHONDROPLASTY;  Surgeon: Frederik Pear, MD;  Location: [place];  Service: Orthopedics;  Laterality: Right;  ? COLONOSCOPY    ? 2007  ? DRUG INDUCED ENDOSCOPY N/A 03/08/2021  ? Procedure: DRUG INDUCED SLEEP  ENDOSCOPY;  Surgeon: Melida Quitter, MD;  Location: [place];  Service: ENT;  Laterality: N/A;  ? HEMORRHOID BANDING  2017  ? KNEE ARTHROSCOPY WITH LATERAL MENISECTOMY Right 03/31/2014  ? Procedure: KNEE ARTHROSCOPY WITH  PARTIAL LATERAL MENISECTOMY;  Surgeon: Frederik Pear, MD;  Location: [place];  Service: Orthopedics;  Laterality: Right;  ? KNEE ARTHROSCOPY WITH MEDIAL MENISECTOMY Right 03/31/2014  ? Procedure: KNEE ARTHROSCOPY WITH PARTIALMEDIAL MENISECTOMY;  Surgeon: Frederik Pear, MD;  Location: [place];  Service: Orthopedics;  Laterality: Right;  ? OOPHORECTOMY  2002  ? Right   ? SIGMOIDOSCOPY    ? 1999  ? ? ?Current Medications: ?No outpatient medications have been marked as taking for the 05/09/21 encounter (Appointment) with Freada Bergeron, MD.  ?  ? ?Allergies:   Sulfa antibiotics and Statins  ? ?Social History  ? ?Socioeconomic History  ? Marital status: Single  ?  Spouse name: Not on file  ? Number of children: 0  ? Years of education: MA  ? Highest education level: Not on file  ?Occupational History  ? Occupation: Retired  ?Tobacco Use  ? Smoking status: Never  ? Smokeless tobacco: Never  ?Vaping Use  ? Vaping Use: Never used  ?Substance and Sexual Activity  ? Alcohol use: Not Currently  ?  Drug use: No  ?  Comment: as teen  ? Sexual activity: Not Currently  ?  Partners: Male  ?Other Topics Concern  ? Not on file  ?Social History Narrative  ? Patient lives at home alone.  ? occ caffeine; twice a week   ? ?Social Determinants of Health  ? ?Financial Resource Strain: Not on file  ?Food Insecurity: Not on file  ?Transportation Needs: Not on file  ?Physical Activity: Not on file  ?Stress: Not on file  ?Social Connections: Not on file  ?  ? ?Family History: ?The patient's ***family history includes Dementia in her mother; Heart attack in her father; Heart disease in her father and mother; Migraines in her mother. There is no history of Colon cancer, Breast  cancer, Esophageal cancer, Stomach cancer, or Rectal cancer. ? ?ROS:   ?Please see the history of present illness.    ?*** All other systems reviewed and are negative. ? ?EKGs/Labs/Other Studies Reviewed:   ? ?The following studies were reviewed today: ?ETT 04/2016: ?Blood pressure demonstrated a hypertensive response to exercise. ?There was no ST segment deviation noted during stress. ?  ?ETT with good exercise tolerance (10:00); no chest pain; hypertensive BP; no diagnostic ST changes; negative adequate ETT; Duke treadmill score 10. ? ?TTE 2018: ?Study Conclusions  ? ?- Left ventricle: The cavity size was normal. Wall thickness was  ?  normal. Systolic function was normal. The estimated ejection  ?  fraction was in the range of 55% to 60%. Wall motion was normal;  ?  there were no regional wall motion abnormalities. The study is  ?  not technically sufficient to allow evaluation of LV diastolic  ?  function.  ? ?Impressions:  ? ?- Normal LV systolic function; trace MR.  ? ?-------------------------------------------------------------------  ?Study data:  Comparison was made to the study of 07/18/2007.  Study  ?status:  Routine.  Procedure:  The patient reported no pain pre or  ?post test. Transthoracic echocardiography. Image quality was  ?adequate.  Study completion:  There were no complications.  ?Transthoracic echocardiography.  M-mode, complete 2D, spectral  ?Doppler, and color Doppler.  Birthdate:  Patient birthdate:  ?29-Aug-1950.  Age:  Patient is 71 yr old.  Sex:  Gender: female.  ?BMI: 29.9 kg/m^2.  Blood pressure:     130/70  Patient status:  ?Outpatient.  Study date:  Study date: 05/16/2016. Study time: 03:00  ?PM.  Location:  [place] [facility]  ? ?-------------------------------------------------------------------  ? ?-------------------------------------------------------------------  ?Left ventricle:  The cavity size was normal. Wall thickness was  ?normal. Systolic function was normal. The estimated  ejection  ?fraction was in the range of 55% to 60%. Wall motion was normal;  ?there were no regional wall motion abnormalities. The study is not  ?technically sufficient to allow evaluation of LV diastolic  ?function.  ? ?-------------------------------------------------------------------  ?Aortic valve:   Trileaflet; mildly calcified leaflets. Mobility was  ?not restricted.  Doppler:  Transvalvular velocity was within the  ?normal range. There was no stenosis. There was no regurgitation.  ?   ?-------------------------------------------------------------------  ?Aorta:  Aortic root: The aortic root was normal in size.  ? ?-------------------------------------------------------------------  ?Mitral valve:   Structurally normal valve.   Mobility was not  ?restricted.  Doppler:  Transvalvular velocity was within the normal  ?range. There was no evidence for stenosis. There was trivial  ?regurgitation.    Peak gradient (D): 3 mm Hg.  ? ?-------------------------------------------------------------------  ?Left atrium:  The atrium was normal in  size.  ? ?-------------------------------------------------------------------  ?Right ventricle:  The cavity size was normal. Systolic function was  ?normal.  ? ?-------------------------------------------------------------------  ?Pulmonic valve:    Doppler:  Transvalvular velocity was within the  ?normal range. There was no evidence for stenosis. There was trivial  ?regurgitation.  ? ?-------------------------------------------------------------------  ?Tricuspid valve:   Structurally normal valve.    Doppler:  ?Transvalvular velocity was within the normal range. There was no  ?regurgitation.  ? ?-------------------------------------------------------------------  ?Right atrium:  The atrium was normal in size.  ? ?-------------------------------------------------------------------  ?Pericardium:  There was no pericardial effusion.   ? ?-------------------------------------------------------------------  ?Systemic veins:  ?Inferior vena cava: The vessel was normal in size.  ? ?EKG:  EKG is *** ordered today.  The ekg ordered today demonstrates *** ? ?Recent Labs: ?No results found

## 2021-05-09 ENCOUNTER — Ambulatory Visit: Payer: Medicare Other | Admitting: Cardiology

## 2021-05-09 ENCOUNTER — Encounter: Payer: Self-pay | Admitting: Cardiology

## 2021-05-09 VITALS — BP 114/66 | HR 65 | Ht 64.0 in | Wt 175.8 lb

## 2021-05-09 DIAGNOSIS — I2510 Atherosclerotic heart disease of native coronary artery without angina pectoris: Secondary | ICD-10-CM

## 2021-05-09 DIAGNOSIS — R079 Chest pain, unspecified: Secondary | ICD-10-CM

## 2021-05-09 DIAGNOSIS — Z79899 Other long term (current) drug therapy: Secondary | ICD-10-CM

## 2021-05-09 DIAGNOSIS — E782 Mixed hyperlipidemia: Secondary | ICD-10-CM | POA: Diagnosis not present

## 2021-05-09 DIAGNOSIS — I10 Essential (primary) hypertension: Secondary | ICD-10-CM

## 2021-05-09 NOTE — Patient Instructions (Signed)
Medication Instructions:  ? ?Your physician recommends that you continue on your current medications as directed. Please refer to the Current Medication list given to you today. ? ?*If you need a refill on your cardiac medications before your next appointment, please call your pharmacy* ? ? ?Lab Work: ? ?1.)  THIS WEEK OR NEXT WEEK--CHECK LIPIDS AT THAT TIME--PLEASE COME FASTING ? ?2.)  IN 9 WEEKS AT A [facility] IN [place]--CHECK LIPIDS--ORDER WAS PLACED AND PLEASE PROVIDE THE [facility] WITH THE REQUISITION FOR THE LAB WORK ? ?If you have labs (blood work) drawn today and your tests are completely normal, you will receive your results only by: ?MyChart Message (if you have MyChart) OR ?A paper copy in the mail ?If you have any lab test that is abnormal or we need to change your treatment, we will call you to review the results. ? ? ?Follow-Up: ?At [facility], you and your health needs are our priority.  As part of our continuing mission to provide you with exceptional heart care, we have created designated Provider Care Teams.  These Care Teams include your primary Cardiologist (physician) and Advanced Practice Providers (APPs -  Physician Assistants and Nurse Practitioners) who all work together to provide you with the care you need, when you need it. ? ?We recommend signing up for the patient portal called "MyChart".  Sign up information is provided on this After Visit Summary.  MyChart is used to connect with patients for Virtual Visits (Telemedicine).  Patients are able to view lab/test results, encounter notes, upcoming appointments, etc.  Non-urgent messages can be sent to your provider as well.   ?To learn more about what you can do with MyChart, go to [email or web address].   ? ?Your next appointment:   ?1 year(s) ? ?The format for your next appointment:   ?In Person ? ?Provider:   ?DR. PEMBERTON  ? ?Important Information About Sugar ? ? ? ? ? ? ?

## 2021-05-09 NOTE — Progress Notes (Signed)
?Cardiology Office Note:   ? ?Date:  05/09/2021  ? ?ID:  Alexis Braun, DOB 1950/08/14, MRN [id number] ? ?PCP:  Michael Boston, MD ?  ?[facility] Providers ?Cardiologist:  None { ? ?Referring MD: Kathyrn Lass, MD  ? ? ?History of Present Illness:   ? ?Alexis Braun is a 71 y.o. female with a hx of anxiety, GERD and HTN who was referred by Dr. Sabra Heck for further evaluation of chest pain. ? ?Was last seen in 2018 by Dr. Johnsie Cancel for chest pain and palpitations. TTE 05/2016 showed normal BiV function and no significant valve disease. Cardiac monitor 04/2016 showed occasional PACs but no arrhythmias. ETT 04/2016 showed hypertensive response during exercise with no evidence of ischemia. 10METs. Her calcium score 03/2021 was 62 which is 69th percentile for age, sex, and race.  ? ?Today, she is doing well. She is very active and is able to exercise vigorously with swimming, running, pilates without issues. She has episodes of right sided chest pain that can radiate to the jaw. The episodes of pain only occur when she is resting with no exertional component. She denies chest pressure, dyspnea at rest or with exertion, PND, orthopnea, or leg swelling.  ? ?She underwent [employer] screening and plaque was found in her R carotid. She has never taken a statin in the past. At this time, she would like to work on her diet but is open to discussing statins after a few months.  ? ?When resting, she feeling brief episodes of palpitations. However, the palpitations do not bother her.  ? ?She is very active and enjoys swimming, briskly walking, and pilates. This morning, she swam 2/3 a mile. She denies exertional symptoms. However, her back and R knee pain will bother but not limit her.  ? ?Her mother was on statin after developed dementia and a mitral valve prolapse and passed away of heart failure in old age. She is working on losing weight and improving her diet with a nutritionist. 10 years ago, she was at her peak weight of 190  lbs and was diagnosed with high blood pressure. She has since lost weight and only takes half a tablet of atenolol. Of note, her recent lipid panel was non-fasting.  ? ?Past Medical History:  ?Diagnosis Date  ? Allergy   ? sulfa  ? Anxiety   ? Arthritis   ? Bladder prolapse, female, acquired   ? Chest pain   ? per pt, over couple years  ? Congenital hearing loss   ? right  ? Gallbladder polyp 2014  ? GERD (gastroesophageal reflux disease)   ? Hepatitis   ? as teen-iv drugs  ? Hx of adenomatous and sessile serrated colonic polyps 12/24/2017  ? Hypertension   ? on medication  ? Internal hemorrhoid   ? Migraines   ? Ovarian cyst 04/03/1999  ? PONV (postoperative nausea and vomiting)   ? Positive hepatitis C antibody test   ? at age 76; has been told that she "self cured" No current or past treatment.  ? Sleep apnea   ? uses c-pap  ? Wears hearing aid   ? right  ? ? ?Past Surgical History:  ?Procedure Laterality Date  ? CHOLECYSTECTOMY N/A 10/08/2012  ? Procedure: LAPAROSCOPIC CHOLECYSTECTOMY WITH INTRAOPERATIVE CHOLANGIOGRAM;  Surgeon: Imogene Burn. Georgette Dover, MD;  Location: [place];  Service: General;  Laterality: N/A;  ? CHONDROPLASTY Right 03/31/2014  ? Procedure: CHONDROPLASTY;  Surgeon: Frederik Pear, MD;  Location: [place];  Service: Orthopedics;  Laterality: Right;  ? COLONOSCOPY    ? 2007  ? DRUG INDUCED ENDOSCOPY N/A 03/08/2021  ? Procedure: DRUG INDUCED SLEEP ENDOSCOPY;  Surgeon: Melida Quitter, MD;  Location: [place];  Service: ENT;  Laterality: N/A;  ? HEMORRHOID BANDING  2017  ? KNEE ARTHROSCOPY WITH LATERAL MENISECTOMY Right 03/31/2014  ? Procedure: KNEE ARTHROSCOPY WITH  PARTIAL LATERAL MENISECTOMY;  Surgeon: Frederik Pear, MD;  Location: [place];  Service: Orthopedics;  Laterality: Right;  ? KNEE ARTHROSCOPY WITH MEDIAL MENISECTOMY Right 03/31/2014  ? Procedure: KNEE ARTHROSCOPY WITH PARTIALMEDIAL MENISECTOMY;  Surgeon: Frederik Pear, MD;  Location: [place];  Service: Orthopedics;  Laterality: Right;  ? OOPHORECTOMY  2002  ? Right   ? SIGMOIDOSCOPY    ? 1999  ? ? ?Current Medications: ?Current Meds  ?Medication Sig  ? ALPRAZolam (XANAX) 0.5 MG tablet Take 0.5 mg by mouth at bedtime as needed for sleep.  ? aspirin EC 81 MG tablet Take 81 mg by mouth daily.  ? atenolol (TENORMIN) 25 MG tablet Take 12.5 mg by mouth daily.  ? cyclobenzaprine (FLEXERIL) 10 MG tablet Take 10 mg by mouth 3 (three) times daily as needed for muscle spasms.  ? estradiol (ESTRACE) 0.1 MG/GM vaginal cream INSERT 1/4 APPLICATORFUL VAGINALLY 3 TIMES A WEEK  ? famotidine (PEPCID) 20 MG tablet Take 20 mg by mouth daily.  ? Flaxseed, Linseed, (FLAX SEED OIL PO) Take 1 capsule by mouth daily.  ? Omega-3 Fatty Acids (FISH OIL) 1000 MG CAPS Take 1 capsule by mouth daily.  ? psyllium (REGULOID) 0.52 g capsule Take 0.52 g by mouth daily.  ? SUMAtriptan (IMITREX) 100 MG tablet Take 100 mg by mouth daily as needed for migraine. May repeat in 2 hours if headache persists or recurs.  ? traZODone (DESYREL) 50 MG tablet Take 50 mg by mouth at bedtime as needed for sleep.  ? valACYclovir (VALTREX) 500 MG tablet Take 1 tablet (500 mg total) by mouth daily.  ?  ? ?Allergies:   Sulfa antibiotics and Statins  ? ?Social History  ? ?Socioeconomic History  ? Marital status: Single  ?  Spouse name: Not on file  ? Number of children: 0  ? Years of education: MA  ? Highest education level: Not on file  ?Occupational History  ? Occupation: Retired  ?Tobacco Use  ? Smoking status: Never  ? Smokeless tobacco: Never  ?Vaping Use  ? Vaping Use: Never used  ?Substance and Sexual Activity  ? Alcohol use: Not Currently  ? Drug use: No  ?  Comment: as teen  ? Sexual activity: Not Currently  ?  Partners: Male  ?Other Topics Concern  ? Not on file  ?Social History Narrative  ? Patient lives at home alone.  ? occ caffeine; twice a week   ? ?Social Determinants of Health  ? ?Financial Resource Strain: Not on file  ?Food  Insecurity: Not on file  ?Transportation Needs: Not on file  ?Physical Activity: Not on file  ?Stress: Not on file  ?Social Connections: Not on file  ?  ? ?Family History: ?The patient's family history includes Dementia in her mother; Heart attack in her father; Heart disease in her father and mother; Migraines in her mother. There is no history of Colon cancer, Breast cancer, Esophageal cancer, Stomach cancer, or Rectal cancer. ? ?ROS:   ?Please see the history of present illness.    ?Review of Systems  ?Constitutional:  Positive  for malaise/fatigue. Negative for weight loss.  ?HENT:  Negative for congestion and sore throat.   ?Eyes:  Negative for blurred vision.  ?Respiratory:  Negative for cough and shortness of breath.   ?Cardiovascular:  Positive for chest pain (R-side) and palpitations. Negative for orthopnea, claudication, leg swelling and PND.  ?Gastrointestinal:  Negative for heartburn and nausea.  ?Genitourinary:  Negative for dysuria and urgency.  ?Musculoskeletal:  Positive for back pain and joint pain (R knee). Negative for myalgias.  ?Skin:  Negative for rash.  ?Neurological:  Positive for tingling (R cheek). Negative for dizziness and headaches.  ?Endo/Heme/Allergies:  Negative for environmental allergies.  ?Psychiatric/Behavioral:  The patient is not nervous/anxious and does not have insomnia.   ?All other systems reviewed and are negative. ? ?EKGs/Labs/Other Studies Reviewed:   ? ?The following studies were reviewed today: ?CT Cardiac Score 04/03/21 ?IMPRESSION: ?Coronary calcium score of 56 is at the 69th percentile for the ?patient's age, sex and race. ? ?ETT 04/2016: ?Blood pressure demonstrated a hypertensive response to exercise. ?There was no ST segment deviation noted during stress. ?  ?ETT with good exercise tolerance (10:00); no chest pain; hypertensive BP; no diagnostic ST changes; negative adequate ETT; Duke treadmill score 10. ? ?TTE 2018: ?Study Conclusions  ? ?- Left ventricle: The  cavity size was normal. Wall thickness was  ?  normal. Systolic function was normal. The estimated ejection  ?  fraction was in the range of 55% to 60%. Wall motion was normal;  ?  there were no regional wall motion

## 2021-05-10 ENCOUNTER — Other Ambulatory Visit: Payer: Medicare Other | Admitting: *Deleted

## 2021-05-10 DIAGNOSIS — Z79899 Other long term (current) drug therapy: Secondary | ICD-10-CM

## 2021-05-10 DIAGNOSIS — E782 Mixed hyperlipidemia: Secondary | ICD-10-CM | POA: Diagnosis not present

## 2021-05-11 LAB — LIPID PANEL
Chol/HDL Ratio: 3.3 ratio (ref 0.0–4.4)
Cholesterol, Total: 196 mg/dL (ref 100–199)
HDL: 59 mg/dL (ref >39)
LDL Chol Calc (NIH): 115 mg/dL — ABNORMAL HIGH (ref 0–99)
Triglycerides: 124 mg/dL (ref 0–149)
VLDL Cholesterol Cal: 22 mg/dL (ref 5–40)

## 2021-05-16 ENCOUNTER — Institutional Professional Consult (permissible substitution): Payer: Medicare Other | Admitting: Neurology

## 2021-05-17 DIAGNOSIS — M25561 Pain in right knee: Secondary | ICD-10-CM | POA: Diagnosis not present

## 2021-05-17 DIAGNOSIS — M1711 Unilateral primary osteoarthritis, right knee: Secondary | ICD-10-CM | POA: Diagnosis not present

## 2021-05-22 ENCOUNTER — Ambulatory Visit: Payer: Medicare Other | Admitting: Cardiology

## 2021-05-22 DIAGNOSIS — H5203 Hypermetropia, bilateral: Secondary | ICD-10-CM | POA: Diagnosis not present

## 2021-05-24 ENCOUNTER — Institutional Professional Consult (permissible substitution): Payer: Medicare Other | Admitting: Neurology

## 2021-05-24 ENCOUNTER — Ambulatory Visit (INDEPENDENT_AMBULATORY_CARE_PROVIDER_SITE_OTHER): Payer: Medicare Other | Admitting: Obstetrics & Gynecology

## 2021-05-24 ENCOUNTER — Encounter: Payer: Self-pay | Admitting: Obstetrics & Gynecology

## 2021-05-24 VITALS — BP 116/68 | HR 68 | Resp 16 | Ht 64.25 in | Wt 173.0 lb

## 2021-05-24 DIAGNOSIS — N952 Postmenopausal atrophic vaginitis: Secondary | ICD-10-CM | POA: Diagnosis not present

## 2021-05-24 DIAGNOSIS — Z9289 Personal history of other medical treatment: Secondary | ICD-10-CM

## 2021-05-24 DIAGNOSIS — N8110 Cystocele, unspecified: Secondary | ICD-10-CM | POA: Diagnosis not present

## 2021-05-24 DIAGNOSIS — Z9189 Other specified personal risk factors, not elsewhere classified: Secondary | ICD-10-CM | POA: Diagnosis not present

## 2021-05-24 DIAGNOSIS — Z780 Asymptomatic menopausal state: Secondary | ICD-10-CM | POA: Diagnosis not present

## 2021-05-24 DIAGNOSIS — Z01419 Encounter for gynecological examination (general) (routine) without abnormal findings: Secondary | ICD-10-CM

## 2021-05-24 MED ORDER — ESTRADIOL 0.1 MG/GM VA CREA: TOPICAL_CREAM | VAGINAL | 4 refills | Status: DC

## 2021-05-24 NOTE — Progress Notes (Signed)
? ? ?Alexis Braun Feb 28, 1950 [id number] ? ? ?History:    71 y.o. G2P0A2 Single.  Spends the summer/fall in [place]/Swims in [place]. ?  ?RP:  Established patient presenting for annual gyn exam  ?  ?HPI: H/O Rt Oophorectomy.  Post menopause on no HRT.  No PMB.  No pelvic pain.  Abstinent. Pap Neg 04/2020.  No h/o abnormal Pap.  Will do Pap every 3 years. Uses Estrace cream twice a week. Cystocele, not using the pessary anymore, better with PT for pelvic floor.  Seen by Dr Juanetta Beets, planning Cystocele Repair with Sling Procedure when back from [place].  Mild SUI improved.  Breasts normal. Mammo Neg 01/2021.  BMI 29.46.  Enjoys swimming.  Health labs with Fam MD.  Alexis Braun 01/2021.  Will repeat BD at age 84. ? ? ?Past medical history,surgical history, family history and social history were all reviewed and documented in the EPIC chart. ? ?Gynecologic History ?No LMP recorded. Patient is postmenopausal. ? ?Obstetric History ?OB History  ?Gravida Para Term Preterm AB Living  ?2       2 0  ?SAB IAB Ectopic Multiple Live Births  ?        0  ?  ?# Outcome Date GA Lbr Len/2nd Weight Sex Delivery Anes PTL Lv  ?2 AB           ?1 AB           ? ? ? ?ROS: A ROS was performed and pertinent positives and negatives are included in the history. ?GENERAL: No fevers or chills. HEENT: No change in vision, no earache, sore throat or sinus congestion. NECK: No pain or stiffness. CARDIOVASCULAR: No chest pain or pressure. No palpitations. PULMONARY: No shortness of breath, cough or wheeze. GASTROINTESTINAL: No abdominal pain, nausea, vomiting or diarrhea, melena or bright red blood per rectum. GENITOURINARY: No urinary frequency, urgency, hesitancy or dysuria. MUSCULOSKELETAL: No joint or muscle pain, no back pain, no recent trauma. DERMATOLOGIC: No rash, no itching, no lesions. ENDOCRINE: No polyuria, polydipsia, no heat or cold intolerance. No recent change in weight. HEMATOLOGICAL: No anemia or easy bruising or bleeding.  NEUROLOGIC: No headache, seizures, numbness, tingling or weakness. PSYCHIATRIC: No depression, no loss of interest in normal activity or change in sleep pattern.  ?  ? ?Exam: ? ? ?Ht 5' 4.25" (1.632 m)   Wt 173 lb (78.5 kg)   BMI 29.46 kg/m?  ? ?Body mass index is 29.46 kg/m?. ? ?General appearance : Well developed well nourished female. No acute distress ?HEENT: Eyes: no retinal hemorrhage or exudates,  Neck supple, trachea midline, no carotid bruits, no thyroidmegaly ?Lungs: Clear to auscultation, no rhonchi or wheezes, or rib retractions  ?Heart: Regular rate and rhythm, no murmurs or gallops ?Breast:Examined in sitting and supine position were symmetrical in appearance, no palpable masses or tenderness,  no skin retraction, no nipple inversion, no nipple discharge, no skin discoloration, no axillary or supraclavicular lymphadenopathy ?Abdomen: no palpable masses or tenderness, no rebound or guarding ?Extremities: no edema or skin discoloration or tenderness ? ?Pelvic: Vulva: Normal ?            Vagina: No gross lesions or discharge ? Cervix: No gross lesions or discharge ? Uterus  AV, normal size, shape and consistency, non-tender and mobile ? Adnexa  Without masses or tenderness ? Anus: Normal ? ? ?Assessment/Plan:  71 y.o. female for annual exam  ? ?1. Well female exam with routine gynecological exam ?H/O Rt Oophorectomy.  Post menopause on no HRT.  No PMB.  No pelvic pain.  Abstinent. Pap Neg 04/2020.  No h/o abnormal Pap.  Will do Pap every 3 years. Uses Estrace cream twice a week. Cystocele, not using the pessary anymore, better with PT for pelvic floor.  Seen by Dr Juanetta Beets, planning Cystocele Repair with Sling Procedure when back from [place].  Mild SUI improved.  Breasts normal. Mammo Neg 01/2021.  BMI 29.46.  Enjoys swimming.  Health labs with Fam MD.  Alexis Braun 01/2021.  Will repeat BD at age 51. ? ?2. Postmenopausal ?H/O Rt Oophorectomy.  Post menopause on no HRT.  No PMB.  No pelvic pain.   Abstinent.  ? ?3. Postmenopausal atrophic vaginitis ?Well on Estradiol cream twice a week.  No CI to continue.  Prescription sent to pharmacy. ? ?4. Bladder prolapse, female, acquired ?Seen by Dr Jeanie Sewer.  Planning Cystocele repair with Sling Procedure in the fall of 2023. ? ?5. Other specified personal risk factors, not elsewhere classified ? ?6. Personal history of other medical treatment ? ?Other orders ?- estradiol (ESTRACE) 0.1 MG/GM vaginal cream; INSERT 1/4 APPLICATORFUL VAGINALLY 3 TIMES A WEEK  ? ?Princess Bruins MD, 11:47 AM 05/24/2021 ? ?  ?

## 2021-05-25 DIAGNOSIS — H16223 Keratoconjunctivitis sicca, not specified as Sjogren's, bilateral: Secondary | ICD-10-CM | POA: Diagnosis not present

## 2021-05-29 NOTE — Progress Notes (Signed)
? ? ?05/30/2021 ?Alexis Braun ?[id number] ?May 17, 1950 ? ?Referring provider: Michael Boston, MD ?Primary GI doctor: Dr. Carlean Purl ? ?ASSESSMENT AND PLAN:  ? ?Going to [place] for the summer and will be back in the fall, will need to get barium swallow then and follow up afterwards with Dr. Carlean Purl ? ?Dysphagia, unspecified type with history of GERD ?-     DG ESOPHAGUS W DOUBLE CM (HD); Future ?Continuing dysphagia s/p EGD with dilatation 04/2020 ?Agree with stopping ETOH, avoid NSAIDS ?Discussed LPR, may try pepcid more regularly ?Discussed pathology versus EGD, no dysplasia, can consider repeat EGD in 2-3 years or if barium abnormal.  ?Possible from LPR/GERD will get barium swallow to evaluate further with possible manometry if continues.  ? ?Personal history of colonic polyps ?02/08/2021 colonoscopy bowel prep adequate with MiraLAX entirely normal.  recall 5 years ? ? ?History of Present Illness:  ?71 y.o. female  with a past medical history of migraines, GERD, dysphagia, chol, history of polyps, and others listed below, returns to clinic today for evaluation of dysphagia. ? ?2013 endoscopy by Dr. Olevia Perches esophagitis and hiatal hernia no stricture but Venia Minks dilator was passed ?04/21/2020 office visit with PA for dysphagia ?05/02/2020 endoscopy normal stomach, normal duodenum Z-line irregular biopsy showed intestinal metaplasia, not enough to diagnose Barrett's esophagus. Dilation was performed in the entire esophagus with a Maloney dilator with mild resistance at 54 Fr. ?02/08/2021 colonoscopy bowel prep adequate with MiraLAX entirely normal.  Call 5 years ?2019 personal history of colonic polyps 3 adenomas and 1 sessile serrated polyp ?03/10/2021 called complaining with trouble swallowing ? ?She continues to have some trouble swallowing.  ?She did have 05/02/2020 ?She states she continues to have intermittent trouble swallowing, examples are almonds or pasta. Never with liquids.  ?Will happen about once a week.   ?She stopped drinking ETOH, states she does not take NSAIDS.  ?Only on pepcid as needed once a week, no PPI.  ?She has occ hoarseness/throat clearing.  ?Brother in law passed and friend passed of esophageal cancer, has that fear.  ? ?Current Medications:  ? ? ?Current Outpatient Medications (Cardiovascular):  ?  atenolol (TENORMIN) 25 MG tablet, Take 12.5 mg by mouth daily. ? ? ?Current Outpatient Medications (Analgesics):  ?  aspirin EC 81 MG tablet, Take 81 mg by mouth daily. ?  SUMAtriptan (IMITREX) 100 MG tablet, Take 100 mg by mouth daily as needed for migraine. May repeat in 2 hours if headache persists or recurs. ? ? ?Current Outpatient Medications (Other):  ?  ALPRAZolam (XANAX) 0.5 MG tablet, Take 0.5 mg by mouth at bedtime as needed for sleep. ?  cyclobenzaprine (FLEXERIL) 10 MG tablet, Take 10 mg by mouth 3 (three) times daily as needed for muscle spasms. ?  estradiol (ESTRACE) 0.1 MG/GM vaginal cream, INSERT 1/4 APPLICATORFUL VAGINALLY 3 TIMES A WEEK ?  famotidine (PEPCID) 20 MG tablet, Take 20 mg by mouth daily. ?  Flaxseed, Linseed, (FLAX SEED OIL PO), Take 1 capsule by mouth daily. ?  Omega-3 Fatty Acids (FISH OIL) 1000 MG CAPS, Take 1 capsule by mouth daily. ?  psyllium (REGULOID) 0.52 g capsule, Take 0.52 g by mouth daily. ?  traZODone (DESYREL) 50 MG tablet, Take 50 mg by mouth at bedtime as needed for sleep. ?  valACYclovir (VALTREX) 500 MG tablet, Take 1 tablet (500 mg total) by mouth daily. (Patient taking differently: Take 500 mg by mouth as needed.) ? ?Surgical History:  ?She  has a past surgical history that includes  Oophorectomy (2002); Colonoscopy; Sigmoidoscopy; Cholecystectomy (N/A, 10/08/2012); Knee arthroscopy with lateral menisectomy (Right, 03/31/2014); Knee arthroscopy with medial menisectomy (Right, 03/31/2014); Chondroplasty (Right, 03/31/2014); Hemorrhoid banding (2017); and Drug induced endoscopy (N/A, 03/08/2021). ?Family History:  ?Her family history includes Dementia in her  mother; Heart attack in her father; Heart disease in her father and mother; Migraines in her mother. ?Social History:  ? reports that she has never smoked. She has never used smokeless tobacco. She reports that she does not currently use alcohol. She reports that she does not use drugs. ? ?Current Medications, Allergies, Past Medical History, Past Surgical History, Family History and Social History were reviewed in [employer] record. ? ?Physical Exam: ?BP 124/72   Pulse (!) 54   Ht 5' 4.5" (1.638 m)   Wt 177 lb 2 oz (80.3 kg)   BMI 29.93 kg/m?  ?General:   Pleasant, well developed female in no acute distress ?Extremities:  without  edema. ?Neurologic:  Alert and  oriented x4;  No focal deficits.  ?Psych:  Cooperative. Normal mood and affect. ? ? ?Vladimir Crofts, PA-C ?05/30/21 ?

## 2021-05-30 ENCOUNTER — Ambulatory Visit: Payer: Medicare Other | Admitting: Physician Assistant

## 2021-05-30 ENCOUNTER — Encounter: Payer: Self-pay | Admitting: Physician Assistant

## 2021-05-30 VITALS — BP 124/72 | HR 54 | Ht 64.5 in | Wt 177.1 lb

## 2021-05-30 DIAGNOSIS — Z86010 Personal history of colonic polyps: Secondary | ICD-10-CM

## 2021-05-30 DIAGNOSIS — M1711 Unilateral primary osteoarthritis, right knee: Secondary | ICD-10-CM | POA: Diagnosis not present

## 2021-05-30 DIAGNOSIS — K219 Gastro-esophageal reflux disease without esophagitis: Secondary | ICD-10-CM

## 2021-05-30 DIAGNOSIS — R1310 Dysphagia, unspecified: Secondary | ICD-10-CM

## 2021-05-30 NOTE — Patient Instructions (Addendum)
Silent reflux: Not all heartburn burns...... ? ?What is LPR? ?Laryngopharyngeal reflux (LPR) or silent reflux is a condition in which acid that is made in the stomach travels up the esophagus (swallowing tube) and gets to the throat. ?Not everyone with reflux has a lot of heartburn or indigestion. In fact, many people with LPR never have heartburn. This is why LPR is called SILENT REFLUX, and the terms "Silent reflux" and "LPR" are often used interchangeably. Because LPR is silent, it is sometimes difficult to diagnose. ? ?How can you tell if you have LPR?  ?Chronic hoarseness- Some people have hoarseness that comes and goes ?throat clearing  ?Cough ?It can cause shortness of breath and cause asthma like symptoms. ?a feeling of a lump in the throat  ?difficulty swallowing ?a problem with too much nose and throat drainage.  ?Some people will feel their esophagus spasm which feels like their heart beating hard and fast, this will usually be after a meal, at rest, or lying down at night.   ? ?How do I treat this? ?Treatment for LPR should be individualized, and your doctor will suggest the best treatment for you. Generally there are several treatments for LPR: ?changing habits and diet to reduce reflux,  ?medications to reduce stomach acid, and  ?surgery to prevent reflux. ?Most people with LPR need to modify how and when they eat, as well as take some medication, to get well. Sometimes, nonprescription liquid antacids, such as Maalox?, Gelucil? and Mylanta? are recommended. When used, these antacids should be taken four times each day - one tablespoon one hour after each meal and before bedtime. ?Dietary and lifestyle changes alone are not often enough to control LPR - medications that reduce stomach acid are also usually needed. These must be prescribed by our doctor. ? ? ?TIPS FOR REDUCING REFLUX AND LPR ?Control your LIFE-STYLE and your DIET! ?If you use tobacco, QUIT.  ?Smoking makes you reflux. After every  cigarette you have some LPR.  ?Don't wear clothing that is too tight, especially around the waist (trousers, corsets, belts).  ?Do not lie down just after eating...in fact, do not eat within three hours of bedtime.  ?You should be on a low-fat diet.  ?Limit your intake of red meat.  ?Limit your intake of butter.  ?Avoid fried foods.  ?Avoid chocolate  ?Avoid cheese.  ?Avoid eggs. ?Specifically avoid caffeine (especially coffee and tea), soda pop (especially cola) and mints.  ?Avoid alcoholic beverages, particularly in the evening. ? ? ?You will be contacted by [facility] in the next 2 days to arrange a Barium Swallow..  The number on your caller ID will be [phone], please answer when they call.  If you have not heard from them in 2 days please call [phone] to schedule.  You can set this up to be done in the fall. Then please call and set up an appointment with Dr Carlean Purl for November.  ? ?I appreciate the opportunity to care for you. ?Vicie Mutters, PA-C ? ? ? ?

## 2021-05-31 DIAGNOSIS — H16223 Keratoconjunctivitis sicca, not specified as Sjogren's, bilateral: Secondary | ICD-10-CM | POA: Diagnosis not present

## 2021-09-26 ENCOUNTER — Ambulatory Visit
Admission: RE | Admit: 2021-09-26 | Discharge: 2021-09-26 | Disposition: A | Discharge status: Home / Self Care | Payer: Medicare Other | Source: Ambulatory Visit | Attending: Physician Assistant | Admitting: Physician Assistant

## 2021-09-26 DIAGNOSIS — R1310 Dysphagia, unspecified: Secondary | ICD-10-CM

## 2021-09-26 DIAGNOSIS — K219 Gastro-esophageal reflux disease without esophagitis: Secondary | ICD-10-CM | POA: Diagnosis not present

## 2021-09-26 DIAGNOSIS — K224 Dyskinesia of esophagus: Secondary | ICD-10-CM | POA: Diagnosis not present

## 2021-09-27 ENCOUNTER — Other Ambulatory Visit: Payer: Self-pay

## 2021-09-27 MED ORDER — PANTOPRAZOLE SODIUM 40 MG PO TBEC: 40.0000 mg | DELAYED_RELEASE_TABLET | Freq: Every day | ORAL | 0 refills | Status: DC

## 2021-10-19 DIAGNOSIS — H16223 Keratoconjunctivitis sicca, not specified as Sjogren's, bilateral: Secondary | ICD-10-CM | POA: Diagnosis not present

## 2021-11-01 DIAGNOSIS — E785 Hyperlipidemia, unspecified: Secondary | ICD-10-CM | POA: Diagnosis not present

## 2021-11-01 DIAGNOSIS — E042 Nontoxic multinodular goiter: Secondary | ICD-10-CM | POA: Diagnosis not present

## 2021-11-01 DIAGNOSIS — I10 Essential (primary) hypertension: Secondary | ICD-10-CM | POA: Diagnosis not present

## 2021-11-01 DIAGNOSIS — R7989 Other specified abnormal findings of blood chemistry: Secondary | ICD-10-CM | POA: Diagnosis not present

## 2021-11-06 DIAGNOSIS — I8312 Varicose veins of left lower extremity with inflammation: Secondary | ICD-10-CM | POA: Diagnosis not present

## 2021-11-06 DIAGNOSIS — I87392 Chronic venous hypertension (idiopathic) with other complications of left lower extremity: Secondary | ICD-10-CM | POA: Diagnosis not present

## 2021-11-07 DIAGNOSIS — M170 Bilateral primary osteoarthritis of knee: Secondary | ICD-10-CM | POA: Diagnosis not present

## 2021-11-07 DIAGNOSIS — E785 Hyperlipidemia, unspecified: Secondary | ICD-10-CM | POA: Diagnosis not present

## 2021-11-07 DIAGNOSIS — Z0000 Encounter for general adult medical examination without abnormal findings: Secondary | ICD-10-CM | POA: Diagnosis not present

## 2021-11-07 DIAGNOSIS — I10 Essential (primary) hypertension: Secondary | ICD-10-CM | POA: Diagnosis not present

## 2021-11-10 ENCOUNTER — Other Ambulatory Visit: Payer: Self-pay | Admitting: Internal Medicine

## 2021-11-10 DIAGNOSIS — I6529 Occlusion and stenosis of unspecified carotid artery: Secondary | ICD-10-CM

## 2021-11-16 ENCOUNTER — Other Ambulatory Visit: Payer: Self-pay | Admitting: Internal Medicine

## 2021-11-16 DIAGNOSIS — Z13820 Encounter for screening for osteoporosis: Secondary | ICD-10-CM

## 2021-11-20 DIAGNOSIS — I8312 Varicose veins of left lower extremity with inflammation: Secondary | ICD-10-CM | POA: Diagnosis not present

## 2021-11-20 DIAGNOSIS — I87392 Chronic venous hypertension (idiopathic) with other complications of left lower extremity: Secondary | ICD-10-CM | POA: Diagnosis not present

## 2021-11-22 ENCOUNTER — Ambulatory Visit
Admission: RE | Admit: 2021-11-22 | Discharge: 2021-11-22 | Disposition: A | Discharge status: Home / Self Care | Payer: Medicare Other | Source: Ambulatory Visit | Attending: Internal Medicine | Admitting: Internal Medicine

## 2021-11-22 DIAGNOSIS — I6523 Occlusion and stenosis of bilateral carotid arteries: Secondary | ICD-10-CM | POA: Diagnosis not present

## 2021-11-22 DIAGNOSIS — I6529 Occlusion and stenosis of unspecified carotid artery: Secondary | ICD-10-CM

## 2021-11-23 DIAGNOSIS — Z713 Dietary counseling and surveillance: Secondary | ICD-10-CM | POA: Diagnosis not present

## 2021-11-23 NOTE — Progress Notes (Unsigned)
11/23/2021 Alexis Braun [id number] 07-08-1950  Referring provider: Michael Boston, MD Primary GI doctor: Dr. Carlean Purl  ASSESSMENT AND PLAN:   Going to [facility] for the summer and will be back in the fall, will need to get barium swallow then and follow up afterwards with Dr. Carlean Purl  Dysphagia, unspecified type with history of GERD -     DG ESOPHAGUS W DOUBLE CM (HD); Future Continuing dysphagia s/p EGD with dilatation 04/2020 Agree with stopping ETOH, avoid NSAIDS Discussed LPR, may try pepcid more regularly Discussed pathology versus EGD, no dysplasia, can consider repeat EGD in 2-3 years or if barium abnormal.  Possible from LPR/GERD will get barium swallow to evaluate further with possible manometry if continues.   Personal history of colonic polyps 02/08/2021 colonoscopy bowel prep adequate with MiraLAX entirely normal.  recall 5 years   History of Present Illness:  71 y.o. female  with a past medical history of migraines, GERD, dysphagia, chol, history of polyps, and others listed below, returns to clinic today for evaluation of dysphagia.  2013 endoscopy by Dr. Olevia Perches esophagitis and hiatal hernia no stricture but Venia Minks dilator was passed 04/21/2020 office visit with PA for dysphagia 05/02/2020 endoscopy normal stomach, normal duodenum Z-line irregular biopsy showed intestinal metaplasia, not enough to diagnose Barrett's esophagus. Dilation was performed in the entire esophagus with a Maloney dilator with mild resistance at 41 Fr. 02/08/2021 colonoscopy bowel prep adequate with MiraLAX entirely normal.  Call 5 years 2019 personal history of colonic polyps 3 adenomas and 1 sessile serrated polyp 05/30/2021 office visit with myself for dysphagia discussed increasing Pepcid. 09/26/2021 barium swallow showed mild dysmotility pictures of chronic reflux related dysmotility, moderate GERD, no hiatal hernia.  No reflux esophagitis.  No masses or stricture. ? Tried PPI  She  continues to have some trouble swallowing.  She did have 05/02/2020 She states she continues to have intermittent trouble swallowing, examples are almonds or pasta. Never with liquids.  Will happen about once a week.  She stopped drinking ETOH, states she does not take NSAIDS.  Only on pepcid as needed once a week, no PPI.  She has occ hoarseness/throat clearing.  Brother in law passed and friend passed of esophageal cancer, has that fear.   Current Medications:    Current Outpatient Medications (Cardiovascular):    atenolol (TENORMIN) 25 MG tablet, Take 12.5 mg by mouth daily.   Current Outpatient Medications (Analgesics):    aspirin EC 81 MG tablet, Take 81 mg by mouth daily.   SUMAtriptan (IMITREX) 100 MG tablet, Take 100 mg by mouth daily as needed for migraine. May repeat in 2 hours if headache persists or recurs.   Current Outpatient Medications (Other):    ALPRAZolam (XANAX) 0.5 MG tablet, Take 0.5 mg by mouth at bedtime as needed for sleep.   cyclobenzaprine (FLEXERIL) 10 MG tablet, Take 10 mg by mouth 3 (three) times daily as needed for muscle spasms.   estradiol (ESTRACE) 0.1 MG/GM vaginal cream, INSERT 1/4 APPLICATORFUL VAGINALLY 3 TIMES A WEEK   famotidine (PEPCID) 20 MG tablet, Take 20 mg by mouth daily.   Flaxseed, Linseed, (FLAX SEED OIL PO), Take 1 capsule by mouth daily.   Omega-3 Fatty Acids (FISH OIL) 1000 MG CAPS, Take 1 capsule by mouth daily.   pantoprazole (PROTONIX) 40 MG tablet, Take 1 tablet (40 mg total) by mouth daily.   psyllium (REGULOID) 0.52 g capsule, Take 0.52 g by mouth daily.   traZODone (DESYREL) 50 MG tablet, Take  50 mg by mouth at bedtime as needed for sleep.   valACYclovir (VALTREX) 500 MG tablet, Take 1 tablet (500 mg total) by mouth daily. (Patient taking differently: Take 500 mg by mouth as needed.)  Surgical History:  She  has a past surgical history that includes Oophorectomy (2002); Colonoscopy; Sigmoidoscopy; Cholecystectomy (N/A,  10/08/2012); Knee arthroscopy with lateral menisectomy (Right, 03/31/2014); Knee arthroscopy with medial menisectomy (Right, 03/31/2014); Chondroplasty (Right, 03/31/2014); Hemorrhoid banding (2017); and Drug induced endoscopy (N/A, 03/08/2021). Family History:  Her family history includes Dementia in her mother; Heart attack in her father; Heart disease in her father and mother; Migraines in her mother. Social History:   reports that she has never smoked. She has never used smokeless tobacco. She reports that she does not currently use alcohol. She reports that she does not use drugs.  Current Medications, Allergies, Past Medical History, Past Surgical History, Family History and Social History were reviewed in [employer] record.  Physical Exam: There were no vitals taken for this visit. General:   Pleasant, well developed female in no acute distress Extremities:  without  edema. Neurologic:  Alert and  oriented x4;  No focal deficits.  Psych:  Cooperative. Normal mood and affect.   Vladimir Crofts, PA-C 11/23/21

## 2021-11-29 ENCOUNTER — Ambulatory Visit (INDEPENDENT_AMBULATORY_CARE_PROVIDER_SITE_OTHER): Payer: Medicare Other | Admitting: Physician Assistant

## 2021-11-29 VITALS — BP 110/76 | HR 61 | Ht 64.0 in | Wt 175.0 lb

## 2021-11-29 DIAGNOSIS — R1310 Dysphagia, unspecified: Secondary | ICD-10-CM | POA: Diagnosis not present

## 2021-11-29 DIAGNOSIS — Z86010 Personal history of colonic polyps: Secondary | ICD-10-CM

## 2021-11-29 DIAGNOSIS — K219 Gastro-esophageal reflux disease without esophagitis: Secondary | ICD-10-CM

## 2021-11-29 NOTE — Patient Instructions (Addendum)
Please take your PEPCID more on daily basis or as needed Can take pantoprazole AS needed like before wine or Mobic Please take this medication 30 minutes to 1 hour before meals- this makes it more effective.  Avoid spicy and acidic foods Avoid fatty foods Limit your intake of coffee, tea, alcohol, and carbonated drinks Work to maintain a healthy weight Keep the head of the bed elevated at least 3 inches with blocks or a wedge pillow if you are having any nighttime symptoms Stay upright for 2 hours after eating Avoid meals and snacks three to four hours before bedtime  If symptoms start with the trouble swallowing again or continue we can repeat endoscopy and talk about repeating EGD  For more information on the TIF procedure please visit the website at [email or web address]. If you would like to view an informative video regarding the TIF procedure, please visit:  [email or web address].   FODMAP stands for fermentable oligo-, di-, mono-saccharides and polyols (1). These are the scientific terms used to classify groups of carbs that are notorious for triggering digestive symptoms like bloating, gas and stomach pain.      We may want to evaluate you for small intestinal bacterial overgrowth, this can cause increase gas, bloating, loose stools or constipation.  There is a test for this we can do or sometimes we will treat a patient with an antibiotic to see if it helps.   Gastroparesis- can evaluate with gastric emptying Please do small frequent meals like 4-6 meals a day.  Eat and drink liquids at separate times.  Avoid high fiber foods, cook your vegetables, avoid high fat food.  Suggest spreading protein throughout the day (greek yogurt, glucerna, soft meat, milk, eggs) Choose soft foods that you can mash with a fork When you are more symptomatic, change to pureed foods foods and liquids.  Consider reading "Living well with Gastroparesis" by Lambert Keto Gastroparesis is a  condition in which food takes longer than normal to empty from the stomach. This condition is also known as delayed gastric emptying. It is usually a long-term (chronic) condition. There is no cure, but there are treatments and things that you can do at home to help relieve symptoms. Treating the underlying condition that causes gastroparesis can also help relieve symptoms What are the causes? In many cases, the cause of this condition is not known. Possible causes include: A hormone (endocrine) disorder, such as hypothyroidism or diabetes. A nervous system disease, such as Parkinson's disease or multiple sclerosis. Cancer, infection, or surgery that affects the stomach or vagus nerve. The vagus nerve runs from your chest, through your neck, and to the lower part of your brain. A connective tissue disorder, such as scleroderma. Certain medicines. What increases the risk? You are more likely to develop this condition if: You have certain disorders or diseases. These may include: An endocrine disorder. An eating disorder. Amyloidosis. Scleroderma. Parkinson's disease. Multiple sclerosis. Cancer or infection of the stomach or the vagus nerve. You have had surgery on your stomach or vagus nerve. You take certain medicines. You are female. What are the signs or symptoms? Symptoms of this condition include: Feeling full after eating very little or a loss of appetite. Nausea, vomiting, or heartburn. Bloating of your abdomen. Inconsistent blood sugar (glucose) levels on blood tests. Unexplained weight loss. Acid from the stomach coming up into the esophagus (gastroesophageal reflux). Sudden tightening (spasm) of the stomach, which can be painful. Symptoms may come and go. Some people may not notice any  symptoms. How is this diagnosed? This condition is diagnosed with tests, such as: Tests that check how long it takes food to move through the stomach and intestines. These tests  include: Upper gastrointestinal (GI) series. For this test, you drink a liquid that shows up well on X-rays, and then X-rays are taken of your intestines. Gastric emptying scintigraphy. For this test, you eat food that contains a small amount of radioactive material, and then scans are taken. Wireless capsule GI monitoring system. For this test, you swallow a pill (capsule) that records information about how foods and fluid move through your stomach. Gastric manometry. For this test, a tube is passed down your throat and into your stomach to measure electrical and muscular activity. Endoscopy. For this test, a long, thin tube with a camera and light on the end is passed down your throat and into your stomach to check for problems in your stomach lining. Ultrasound. This test uses sound waves to create images of the inside of your body. This can help rule out gallbladder disease or pancreatitis as a cause of your symptoms. How is this treated? There is no cure for this condition, but treatment and home care may relieve symptoms. Treatment may include: Treating the underlying cause. Managing your symptoms by making changes to your diet and exercise habits. Taking medicines to control nausea and vomiting and to stimulate stomach muscles. Getting food through a feeding tube in the hospital. This may be done in severe cases. Having surgery to insert a device called a gastric electrical stimulator into your body. This device helps improve stomach emptying and control nausea and vomiting. Follow these instructions at home: Take over-the-counter and prescription medicines only as told by your health care provider. Follow instructions from your health care provider about eating or drinking restrictions. Your health care provider may recommend that you: Eat smaller meals more often. Eat low-fat foods. Eat low-fiber forms of high-fiber foods. For example, eat cooked vegetables instead of raw  vegetables. Have only liquid foods instead of solid foods. Liquid foods are easier to digest. Drink enough fluid to keep your urine pale yellow. Exercise as often as told by your health care provider. Keep all follow-up visits. This is important. Contact a health care provider if you: Notice that your symptoms do not improve with treatment. Have new symptoms. Get help right away if you: Have severe pain in your abdomen that does not improve with treatment. Have nausea that is severe or does not go away. Vomit every time you drink fluids. Summary Gastroparesis is a long-term (chronic) condition in which food takes longer than normal to empty from the stomach. Symptoms include nausea, vomiting, heartburn, bloating of your abdomen, and loss of appetite. Eating smaller portions, low-fat foods, and low-fiber forms of high-fiber foods may help you manage your symptoms. Get help right away if you have severe pain in your abdomen. This information is not intended to replace advice given to you by your health care provider. Make sure you discuss any questions you have with your health care provider. Document Revised: 05/11/2019 Document Reviewed: 05/11/2019 Elsevier Patient Education  2021 [employer].

## 2022-01-18 ENCOUNTER — Other Ambulatory Visit: Payer: Self-pay | Admitting: Internal Medicine

## 2022-01-18 DIAGNOSIS — G4733 Obstructive sleep apnea (adult) (pediatric): Secondary | ICD-10-CM | POA: Diagnosis not present

## 2022-01-18 DIAGNOSIS — Z13820 Encounter for screening for osteoporosis: Secondary | ICD-10-CM

## 2022-01-22 ENCOUNTER — Ambulatory Visit (INDEPENDENT_AMBULATORY_CARE_PROVIDER_SITE_OTHER): Payer: Self-pay | Admitting: Obstetrics & Gynecology

## 2022-01-22 ENCOUNTER — Encounter: Payer: Self-pay | Admitting: Obstetrics & Gynecology

## 2022-01-22 VITALS — BP 134/80 | Ht 63.0 in | Wt 175.0 lb

## 2022-01-22 DIAGNOSIS — N393 Stress incontinence (female) (male): Secondary | ICD-10-CM

## 2022-01-22 DIAGNOSIS — N8110 Cystocele, unspecified: Secondary | ICD-10-CM

## 2022-01-22 NOTE — Progress Notes (Signed)
    Alexis Braun 1950/03/03 [id number]        72 y.o.  G2P0020   RP: Counseling on Mild SUI with Cystocele   HPI: Patient was scheduled for a Vaginal Hysterectomy with a Cystocele repair and a Sling procedure in the fall of 2023 with Dr Zigmund Daniel, but canceled the surgery because she had read about the recovery time and risks of complications and became worried.  Patient reports doing well with the pessary and that the SUI is mild.  She is planning to have a new home built this coming summer and would like to postpone the surgery until after that project.  No pelvic pain.  No PMB.  BMs normal.  Has not done Pelvic floor PT so far.   OB History  Gravida Para Term Preterm AB Living  2       2 0  SAB IAB Ectopic Multiple Live Births          0    # Outcome Date GA Lbr Len/2nd Weight Sex Delivery Anes PTL Lv  2 AB           1 AB             Past medical history,surgical history, problem list, medications, allergies, family history and social history were all reviewed and documented in the EPIC chart.   Directed ROS with pertinent positives and negatives documented in the history of present illness/assessment and plan.  Exam:  Vitals:   01/22/22 1557  BP: 134/80  Weight: 175 lb (79.4 kg)  Height: '5\' 3"'$  (1.6 m)   General appearance:  Normal  Gynecologic exam in standing position with Valsalva: Vulva normal.  Cystocele grade 4/4, felt at the vulva.  Minor uterine prolapse.  No Rectocele.   Assessment/Plan:  72 y.o. G2P0020   1. Baden-Walker grade 4 cystocele Patient was scheduled for a Vaginal Hysterectomy with a Cystocele repair and a Sling procedure in the fall of 2023 with Dr Zigmund Daniel, but canceled the surgery because she had read about the recovery time and risks of complications and became worried.  Patient reports doing well with the pessary and that the SUI is mild.  She is planning to have a new home built this coming summer and would like to postpone the surgery until  after that project.  No pelvic pain.  No PMB.  BMs normal.  Has not done Pelvic floor PT so far.  On gyn exam today, Cystocele is grade 4/4.  Minor Uterine prolapse grade 1-2/3.  No Rectocele.  Will continue to use the pessary at this time.  Avoid Pelvic floor pressure.  Empty her bladder before overfilling it.  Recommend contacting Dr Zigmund Daniel to discuss this plan and get further recommendations from her.  2. SUI (stress urinary incontinence, female)  Will start PT for pelvic floor reinforcement.  Princess Bruins MD, 4:06 PM 01/22/2022

## 2022-02-12 DIAGNOSIS — L723 Sebaceous cyst: Secondary | ICD-10-CM | POA: Diagnosis not present

## 2022-02-12 DIAGNOSIS — G8929 Other chronic pain: Secondary | ICD-10-CM | POA: Diagnosis not present

## 2022-02-28 DIAGNOSIS — L940 Localized scleroderma [morphea]: Secondary | ICD-10-CM | POA: Diagnosis not present

## 2022-02-28 DIAGNOSIS — D2261 Melanocytic nevi of right upper limb, including shoulder: Secondary | ICD-10-CM | POA: Diagnosis not present

## 2022-02-28 DIAGNOSIS — L814 Other melanin hyperpigmentation: Secondary | ICD-10-CM | POA: Diagnosis not present

## 2022-02-28 DIAGNOSIS — D225 Melanocytic nevi of trunk: Secondary | ICD-10-CM | POA: Diagnosis not present

## 2022-02-28 DIAGNOSIS — L821 Other seborrheic keratosis: Secondary | ICD-10-CM | POA: Diagnosis not present

## 2022-02-28 DIAGNOSIS — D485 Neoplasm of uncertain behavior of skin: Secondary | ICD-10-CM | POA: Diagnosis not present

## 2022-03-08 DIAGNOSIS — E785 Hyperlipidemia, unspecified: Secondary | ICD-10-CM | POA: Diagnosis not present

## 2022-03-08 DIAGNOSIS — Z79899 Other long term (current) drug therapy: Secondary | ICD-10-CM | POA: Diagnosis not present

## 2022-03-08 DIAGNOSIS — I10 Essential (primary) hypertension: Secondary | ICD-10-CM | POA: Diagnosis not present

## 2022-03-08 DIAGNOSIS — E042 Nontoxic multinodular goiter: Secondary | ICD-10-CM | POA: Diagnosis not present

## 2022-03-08 DIAGNOSIS — K219 Gastro-esophageal reflux disease without esophagitis: Secondary | ICD-10-CM | POA: Diagnosis not present

## 2022-03-13 ENCOUNTER — Encounter: Payer: Self-pay | Admitting: Internal Medicine

## 2022-03-14 DIAGNOSIS — H0288B Meibomian gland dysfunction left eye, upper and lower eyelids: Secondary | ICD-10-CM | POA: Diagnosis not present

## 2022-03-14 DIAGNOSIS — H0288A Meibomian gland dysfunction right eye, upper and lower eyelids: Secondary | ICD-10-CM | POA: Diagnosis not present

## 2022-03-14 DIAGNOSIS — H16223 Keratoconjunctivitis sicca, not specified as Sjogren's, bilateral: Secondary | ICD-10-CM | POA: Diagnosis not present

## 2022-03-15 DIAGNOSIS — G8929 Other chronic pain: Secondary | ICD-10-CM | POA: Diagnosis not present

## 2022-03-15 DIAGNOSIS — D485 Neoplasm of uncertain behavior of skin: Secondary | ICD-10-CM | POA: Diagnosis not present

## 2022-03-15 DIAGNOSIS — L905 Scar conditions and fibrosis of skin: Secondary | ICD-10-CM | POA: Diagnosis not present

## 2022-03-15 DIAGNOSIS — L723 Sebaceous cyst: Secondary | ICD-10-CM | POA: Diagnosis not present

## 2022-03-19 ENCOUNTER — Encounter: Payer: Self-pay | Admitting: Internal Medicine

## 2022-03-20 ENCOUNTER — Other Ambulatory Visit: Payer: Self-pay

## 2022-03-20 DIAGNOSIS — I781 Nevus, non-neoplastic: Secondary | ICD-10-CM

## 2022-03-20 DIAGNOSIS — I872 Venous insufficiency (chronic) (peripheral): Secondary | ICD-10-CM

## 2022-03-27 DIAGNOSIS — K080 Exfoliation of teeth due to systemic causes: Secondary | ICD-10-CM | POA: Diagnosis not present

## 2022-03-28 ENCOUNTER — Other Ambulatory Visit: Payer: Self-pay | Admitting: Obstetrics & Gynecology

## 2022-03-28 DIAGNOSIS — Z0000 Encounter for general adult medical examination without abnormal findings: Secondary | ICD-10-CM

## 2022-03-29 ENCOUNTER — Ambulatory Visit
Admission: RE | Admit: 2022-03-29 | Discharge: 2022-03-29 | Disposition: A | Discharge status: Home / Self Care | Payer: Medicare Other | Source: Ambulatory Visit | Attending: Obstetrics & Gynecology | Admitting: Obstetrics & Gynecology

## 2022-03-29 DIAGNOSIS — L723 Sebaceous cyst: Secondary | ICD-10-CM | POA: Diagnosis not present

## 2022-03-29 DIAGNOSIS — G8929 Other chronic pain: Secondary | ICD-10-CM | POA: Diagnosis not present

## 2022-03-29 DIAGNOSIS — Z1231 Encounter for screening mammogram for malignant neoplasm of breast: Secondary | ICD-10-CM | POA: Diagnosis not present

## 2022-03-29 DIAGNOSIS — D485 Neoplasm of uncertain behavior of skin: Secondary | ICD-10-CM | POA: Diagnosis not present

## 2022-03-29 DIAGNOSIS — Z0000 Encounter for general adult medical examination without abnormal findings: Secondary | ICD-10-CM

## 2022-04-03 ENCOUNTER — Ambulatory Visit (HOSPITAL_COMMUNITY)
Admission: RE | Admit: 2022-04-03 | Discharge: 2022-04-03 | Disposition: A | Discharge status: Home / Self Care | Payer: Medicare Other | Source: Ambulatory Visit | Attending: Vascular Surgery | Admitting: Vascular Surgery

## 2022-04-03 ENCOUNTER — Encounter: Payer: Self-pay | Admitting: Vascular Surgery

## 2022-04-03 ENCOUNTER — Ambulatory Visit: Payer: Medicare Other | Admitting: Vascular Surgery

## 2022-04-03 VITALS — BP 129/64 | HR 55 | Temp 98.8°F | Resp 20 | Ht 63.0 in | Wt 172.0 lb

## 2022-04-03 DIAGNOSIS — I781 Nevus, non-neoplastic: Secondary | ICD-10-CM

## 2022-04-03 DIAGNOSIS — I872 Venous insufficiency (chronic) (peripheral): Secondary | ICD-10-CM

## 2022-04-04 DIAGNOSIS — M1711 Unilateral primary osteoarthritis, right knee: Secondary | ICD-10-CM | POA: Diagnosis not present

## 2022-04-04 NOTE — Progress Notes (Signed)
[facility] [place]  ASSESSMENT / PLAN: 72 y.o. female with chronic venous insufficiency of bilateral lower extremities causing reticular veins.  The patient is minimally symptomatic from her chronic venous insufficiency.  She has been previously told she required intervention.  I do not agree with this assessment.  I encouraged healthy lifestyle, which she is already achieving.  She can follow-up with me on an as-needed basis.  CHIEF COMPLAINT: Chronic venous insufficiency  HISTORY OF PRESENT ILLNESS: Alexis Braun is a 72 y.o. female who presents to clinic for evaluation of chronic venous insufficiency.  She has been previously informed that she needs venous intervention, but would require payment out-of-pocket.  She is virtually asymptomatic from a venous symptom perspective.  She is an athletic woman, who likes to run, cycle, and play golf.  She has no swelling in her legs.  She does notice some small areas of reticular veins which are not very bothersome to her.   Past Medical History:  Diagnosis Date   Allergy    sulfa   Anxiety    Arthritis    Bladder prolapse, female, acquired    Chest pain    per pt, over couple years   Congenital hearing loss    right   Gallbladder polyp 2014   GERD (gastroesophageal reflux disease)    Hepatitis    as teen-iv drugs   Hx of adenomatous and sessile serrated colonic polyps 12/24/2017   Hypertension    on medication   Internal hemorrhoid    Migraines    Ovarian cyst 04/03/1999   PONV (postoperative nausea and vomiting)    Positive hepatitis C antibody test    at age 83; has been told that she "self cured" No current or past treatment.   Sleep apnea    uses c-pap   Wears hearing aid    right    Past Surgical History:  Procedure Laterality Date   CHOLECYSTECTOMY N/A 10/08/2012   Procedure: LAPAROSCOPIC CHOLECYSTECTOMY WITH INTRAOPERATIVE CHOLANGIOGRAM;  Surgeon: Imogene Burn. Georgette Dover, MD;  Location: [facility];   Service: General;  Laterality: N/A;   CHONDROPLASTY Right 03/31/2014   Procedure: CHONDROPLASTY;  Surgeon: Frederik Pear, MD;  Location: [facility] [facility];  Service: Orthopedics;  Laterality: Right;   COLONOSCOPY     2007   DRUG INDUCED ENDOSCOPY N/A 03/08/2021   Procedure: DRUG INDUCED SLEEP ENDOSCOPY;  Surgeon: Melida Quitter, MD;  Location: [place];  Service: ENT;  Laterality: N/A;   HEMORRHOID BANDING  2017   KNEE ARTHROSCOPY WITH LATERAL MENISECTOMY Right 03/31/2014   Procedure: KNEE ARTHROSCOPY WITH  PARTIAL LATERAL MENISECTOMY;  Surgeon: Frederik Pear, MD;  Location: [place];  Service: Orthopedics;  Laterality: Right;   KNEE ARTHROSCOPY WITH MEDIAL MENISECTOMY Right 03/31/2014   Procedure: KNEE ARTHROSCOPY WITH PARTIALMEDIAL MENISECTOMY;  Surgeon: Frederik Pear, MD;  Location: [place];  Service: Orthopedics;  Laterality: Right;   OOPHORECTOMY  2002   Right    scelero     SIGMOIDOSCOPY     1999    Family History  Problem Relation Age of Onset   Heart disease Father    Heart attack Father    Heart disease Mother    Dementia Mother    Migraines Mother    Colon cancer Neg Hx    Breast cancer Neg Hx    Esophageal cancer Neg Hx    Stomach cancer Neg Hx    Rectal cancer Neg Hx  Social History   Socioeconomic History   Marital status: Single    Spouse name: Not on file   Number of children: 0   Years of education: MA   Highest education level: Not on file  Occupational History   Occupation: Retired  Tobacco Use   Smoking status: Never   Smokeless tobacco: Never  Vaping Use   Vaping Use: Never used  Substance and Sexual Activity   Alcohol use: Not Currently   Drug use: No   Sexual activity: Not Currently    Partners: Male    Birth control/protection: Post-menopausal    Comment: younger than 45, more than 5  Other Topics Concern   Not on file  Social History Narrative   Patient lives at home alone.   occ  caffeine; twice a week    Social Determinants of Health   Financial Resource Strain: Not on file  Food Insecurity: Not on file  Transportation Needs: Not on file  Physical Activity: Not on file  Stress: Not on file  Social Connections: Not on file  Intimate Partner Violence: Not on file    No Known Allergies  Current Outpatient Medications  Medication Sig Dispense Refill   ALPRAZolam (XANAX) 0.5 MG tablet Take 0.5 mg by mouth at bedtime as needed for sleep.     aspirin EC 81 MG tablet Take 81 mg by mouth daily.     atenolol (TENORMIN) 25 MG tablet Take 12.5 mg by mouth daily.     cyclobenzaprine (FLEXERIL) 10 MG tablet Take 10 mg by mouth 3 (three) times daily as needed for muscle spasms.     estradiol (ESTRACE) 0.1 MG/GM vaginal cream INSERT 1/4 APPLICATORFUL VAGINALLY 3 TIMES A WEEK 42.5 g 4   famotidine (PEPCID) 20 MG tablet Take 20 mg by mouth daily.     Flaxseed, Linseed, (FLAX SEED OIL PO) Take 1 capsule by mouth daily.     Omega-3 Fatty Acids (FISH OIL) 1000 MG CAPS Take 1 capsule by mouth daily.     pantoprazole (PROTONIX) 40 MG tablet Take 1 tablet (40 mg total) by mouth daily. 90 tablet 0   psyllium (REGULOID) 0.52 g capsule Take 0.52 g by mouth daily.     SUMAtriptan (IMITREX) 100 MG tablet Take 100 mg by mouth daily as needed for migraine. May repeat in 2 hours if headache persists or recurs.     traZODone (DESYREL) 50 MG tablet Take 50 mg by mouth at bedtime as needed for sleep.     rosuvastatin (CRESTOR) 5 MG tablet Take 5 mg by mouth daily. (Patient not taking: Reported on 04/03/2022)     valACYclovir (VALTREX) 500 MG tablet Take 1 tablet (500 mg total) by mouth daily. (Patient taking differently: Take 500 mg by mouth as needed.) 90 tablet 3   No current facility-administered medications for this visit.    PHYSICAL EXAM Vitals:   04/03/22 1410  BP: 129/64  Pulse: (!) 55  Resp: 20  Temp: 98.8 F (37.1 C)  SpO2: 97%  Weight: 172 lb (78 kg)  Height: 5\' 3"  (1.6  m)   Well-appearing woman in no acute distress Regular rate and rhythm Unlabored breathing 2+ radial pulses Scattered reticular veins across the ankles bilaterally   PERTINENT LABORATORY AND RADIOLOGIC DATA  Most recent CBC    Latest Ref Rng & Units 04/05/2019   12:39 PM 10/09/2012   11:30 AM 10/07/2012    8:35 AM  CBC  WBC 4.0 - 10.5 K/uL 5.3  13.0  5.4   Hemoglobin 12.0 - 15.0 g/dL 12.5  13.3  14.0   Hematocrit 36.0 - 46.0 % 36.1  36.6  38.8   Platelets 150 - 400 K/uL 279  290  294      Most recent CMP    Latest Ref Rng & Units 04/05/2019   12:39 PM 03/29/2014   11:15 AM 10/09/2012   11:30 AM  CMP  Glucose 70 - 99 mg/dL 103  115  114   BUN 8 - 23 mg/dL 23  16  12    Creatinine 0.44 - 1.00 mg/dL 0.68  0.69  0.67   Sodium 135 - 145 mmol/L 137  135  135   Potassium 3.5 - 5.1 mmol/L 4.3  4.3  3.6   Chloride 98 - 111 mmol/L 104  104  99   CO2 22 - 32 mmol/L 24  24  25    Calcium 8.9 - 10.3 mg/dL 9.5  9.6  10.0   Total Protein 6.0 - 8.3 g/dL   7.3   Total Bilirubin 0.3 - 1.2 mg/dL   0.4   Alkaline Phos 39 - 117 U/L   81   AST 0 - 37 U/L   42   ALT 0 - 35 U/L   43     Renal function CrCl cannot be calculated (Patient's most recent lab result is older than the maximum 21 days allowed.).  No results found for: "HGBA1C"  LDL Chol Calc (NIH)  Date Value Ref Range Status  05/10/2021 115 (H) 0 - 99 mg/dL Final     Vascular Imaging: Left lower extremity venous duplex - No evidence of deep vein thrombosis seen in the left lower extremity,  from the common femoral through the popliteal veins.  - No evidence of superficial venous thrombosis in the left lower  extremity.  - The deep venous system is competent.  - The great saphneous vein is incompetent.  - The small saphenous vein is competent.    Yevonne Aline. Stanford Breed, MD FACS [facility] Phone Number: [phone] 04/04/2022 8:12 AM   Total time spent on preparing this encounter  including chart review, data review, collecting history, examining the patient, coordinating care for this new patient, 45 minutes.  Portions of this report may have been transcribed using voice recognition software.  Every effort has been made to ensure accuracy; however, inadvertent computerized transcription errors may still be present.

## 2022-04-19 DIAGNOSIS — H0288A Meibomian gland dysfunction right eye, upper and lower eyelids: Secondary | ICD-10-CM | POA: Diagnosis not present

## 2022-04-19 DIAGNOSIS — H16223 Keratoconjunctivitis sicca, not specified as Sjogren's, bilateral: Secondary | ICD-10-CM | POA: Diagnosis not present

## 2022-04-19 DIAGNOSIS — H02413 Mechanical ptosis of bilateral eyelids: Secondary | ICD-10-CM | POA: Diagnosis not present

## 2022-04-19 DIAGNOSIS — H0288B Meibomian gland dysfunction left eye, upper and lower eyelids: Secondary | ICD-10-CM | POA: Diagnosis not present

## 2022-05-03 DIAGNOSIS — N393 Stress incontinence (female) (male): Secondary | ICD-10-CM | POA: Diagnosis not present

## 2022-05-03 DIAGNOSIS — N952 Postmenopausal atrophic vaginitis: Secondary | ICD-10-CM | POA: Diagnosis not present

## 2022-05-03 DIAGNOSIS — G4733 Obstructive sleep apnea (adult) (pediatric): Secondary | ICD-10-CM | POA: Diagnosis not present

## 2022-05-03 DIAGNOSIS — N812 Incomplete uterovaginal prolapse: Secondary | ICD-10-CM | POA: Diagnosis not present

## 2022-05-08 DIAGNOSIS — M1711 Unilateral primary osteoarthritis, right knee: Secondary | ICD-10-CM | POA: Diagnosis not present

## 2022-05-09 ENCOUNTER — Ambulatory Visit: Payer: Medicare Other | Admitting: Gastroenterology

## 2022-05-09 DIAGNOSIS — H0288A Meibomian gland dysfunction right eye, upper and lower eyelids: Secondary | ICD-10-CM | POA: Diagnosis not present

## 2022-05-09 DIAGNOSIS — H16223 Keratoconjunctivitis sicca, not specified as Sjogren's, bilateral: Secondary | ICD-10-CM | POA: Diagnosis not present

## 2022-05-09 DIAGNOSIS — H2513 Age-related nuclear cataract, bilateral: Secondary | ICD-10-CM | POA: Diagnosis not present

## 2022-05-09 DIAGNOSIS — H0288B Meibomian gland dysfunction left eye, upper and lower eyelids: Secondary | ICD-10-CM | POA: Diagnosis not present

## 2022-05-10 NOTE — Progress Notes (Signed)
05/14/2022 Alexis Braun [id number] 09-Sep-1950  Referring provider: Melida Quitter, MD Primary GI doctor: Dr. Leone Payor  ASSESSMENT AND PLAN:   Dysphagia  with history of GERD  s/p EGD with dilatation 04/2020 Barium swallow showed reflux She is doing well on the pepcid/PPI, avoiding NSAIDS/wine Continue the same, will call if any worsening issues  Personal history of colonic polyps 02/08/2021 colonoscopy bowel prep adequate with MiraLAX entirely normal.  recall 5 years ( 01/2026)  Abnormal stools with urgency intermittent with some yellow/light colored stool no weight loss or red flag symptoms associated ? Bile acid with fatty foods s/p cholecystectomy, IBS, pelvic floor?  Given FODMAP diet Do food diary/stool IBGard samples given Possible pelvic floor components, getting cystolcele/rectocele repair with Dr. Ashley Royalty.   If any worsening symptoms would consider CT scan versus [place]  Patient spends 6 months out of the year else where, will schedule 1 year follow up   History of Present Illness:  72 y.o. female  with a past medical history of migraines, GERD, dysphagia, chol, history of polyps, s/p cholecystectomy 2016 and others listed below, returns to clinic today for evaluation of dysphagia.  2013 endoscopy by Dr. Juanda Chance esophagitis and hiatal hernia no stricture but Elease Hashimoto dilator was passed 04/21/2020 office visit with PA for dysphagia 05/02/2020 endoscopy normal stomach, normal duodenum Z-line irregular biopsy showed intestinal metaplasia, not enough to diagnose Barrett's esophagus. Dilation was performed in the entire esophagus with a Maloney dilator with mild resistance at 54 Fr. 02/08/2021 colonoscopy bowel prep adequate with MiraLAX entirely normal.  Call 5 years. 2019 personal history of colonic polyps 3 adenomas and 1 sessile serrated polyp 05/30/2021 office visit with myself for dysphagia discussed increasing Pepcid. 09/26/2021 barium swallow showed mild dysmotility  pictures of chronic reflux related dysmotility, moderate GERD, no hiatal hernia.  No reflux esophagitis.  No masses or stricture.  She presents today for follow up.  She is on pantoprazole 3-4 x  a week, and takes pepcid as needed. She avoids fatty foods which makes it worse.  She states she no longer has swallowing issues, no melena, no weight loss.  She is doing well with diet and medications.   She states she normally take metamucil and has well formed brown stool daily however she will have episodic issues.  Once a month or more, she will have headache, body aches and fatigue.  Then she will have light/tan stools occ yellow, small/skinny stools with associated LLQ ab cramping and urgency. No fever, chills, no nausea, vomiting.  She will just eat miso, keifer, yogurt, stays away from salads and this helps.  She is having cystocele and sling procedure in the fall 2023.   Status post cholecystectomy 3 years ago.  Current Medications:    Current Outpatient Medications (Cardiovascular):    atenolol (TENORMIN) 25 MG tablet, Take 12.5 mg by mouth daily.   rosuvastatin (CRESTOR) 5 MG tablet, Take 5 mg by mouth daily.   Current Outpatient Medications (Analgesics):    aspirin EC 81 MG tablet, Take 81 mg by mouth daily.   SUMAtriptan (IMITREX) 100 MG tablet, Take 100 mg by mouth daily as needed for migraine. May repeat in 2 hours if headache persists or recurs.   Current Outpatient Medications (Other):    ALPRAZolam (XANAX) 0.5 MG tablet, Take 0.5 mg by mouth at bedtime as needed for sleep.   cyclobenzaprine (FLEXERIL) 10 MG tablet, Take 10 mg by mouth 3 (three) times daily as needed for muscle spasms.  estradiol (ESTRACE) 0.1 MG/GM vaginal cream, INSERT 1/4 APPLICATORFUL VAGINALLY 3 TIMES A WEEK   famotidine (PEPCID) 20 MG tablet, Take 20 mg by mouth daily.   Flaxseed, Linseed, (FLAX SEED OIL PO), Take 1 capsule by mouth daily.   Omega-3 Fatty Acids (FISH OIL) 1000 MG CAPS, Take 1  capsule by mouth daily.   pantoprazole (PROTONIX) 40 MG tablet, Take 1 tablet (40 mg total) by mouth daily.   psyllium (REGULOID) 0.52 g capsule, Take 0.52 g by mouth daily.   traZODone (DESYREL) 50 MG tablet, Take 50 mg by mouth at bedtime as needed for sleep.   valACYclovir (VALTREX) 500 MG tablet, Take 1 tablet (500 mg total) by mouth daily. (Patient taking differently: Take 500 mg by mouth as needed.)  Surgical History:  She  has a past surgical history that includes Oophorectomy (2002); Colonoscopy; Sigmoidoscopy; Cholecystectomy (N/A, 10/08/2012); Knee arthroscopy with lateral menisectomy (Right, 03/31/2014); Knee arthroscopy with medial menisectomy (Right, 03/31/2014); Chondroplasty (Right, 03/31/2014); Hemorrhoid banding (2017); Drug induced endoscopy (N/A, 03/08/2021); and scelero. Family History:  Her family history includes Dementia in her mother; Heart attack in her father; Heart disease in her father and mother; Migraines in her mother. Social History:   reports that she has never smoked. She has never used smokeless tobacco. She reports current alcohol use. She reports that she does not use drugs.  Current Medications, Allergies, Past Medical History, Past Surgical History, Family History and Social History were reviewed in [employer] record.  Physical Exam: BP 104/66   Pulse (!) 57   Ht 5\' 4"  (1.626 m)   Wt 170 lb (77.1 kg)   SpO2 97%   BMI 29.18 kg/m   General:   Pleasant, well developed female in no acute distress Head:   Normocephalic and atraumatic. Eyes:  sclerae anicteric,conjunctive pink  Heart:   regular rate and rhythm Pulm:  Clear anteriorly; no wheezing Abdomen:   Soft, Obese AB, Active bowel sounds. No tenderness . , No organomegaly appreciated. Rectal: Not evaluated Extremities:  Without edema. Msk: Symmetrical without gross deformities. Peripheral pulses intact.  Neurologic:  Alert and  oriented x4;  No focal deficits.  Skin:    Dry and intact without significant lesions or rashes. Psychiatric:  Cooperative. Normal mood and affect.   Doree Albee, PA-C 05/14/22

## 2022-05-14 ENCOUNTER — Ambulatory Visit: Payer: Medicare Other | Admitting: Physician Assistant

## 2022-05-14 ENCOUNTER — Encounter: Payer: Self-pay | Admitting: Physician Assistant

## 2022-05-14 VITALS — BP 104/66 | HR 57 | Ht 64.0 in | Wt 170.0 lb

## 2022-05-14 DIAGNOSIS — R140 Abdominal distension (gaseous): Secondary | ICD-10-CM

## 2022-05-14 DIAGNOSIS — K219 Gastro-esophageal reflux disease without esophagitis: Secondary | ICD-10-CM

## 2022-05-14 DIAGNOSIS — N812 Incomplete uterovaginal prolapse: Secondary | ICD-10-CM

## 2022-05-14 NOTE — Patient Instructions (Addendum)
Please follow up in a year!  First do a trial off milk/lactose products if you use them.  Add fiber like benefiber or citracel once a day Increase activity Can do trial of IBGard which is over the counter for AB pain- Take 1-2 capsules once a day for maintence or twice a day during a flare if any worsening symptoms like blood in stool, weight loss, please call the office   Please try low FODMAP diet- see below- start with eliminating just one column at a time, the table at the very bottom contains foods that are safe to take   FODMAP stands for fermentable oligo-, di-, mono-saccharides and polyols (1). These are the scientific terms used to classify groups of carbs that are notorious for triggering digestive symptoms like bloating, gas and stomach pain.      _______________________________________________________  If your blood pressure at your visit was 140/90 or greater, please contact your primary care physician to follow up on this.  _______________________________________________________  If you are age 83 or older, your body mass index should be between 23-30. Your Body mass index is 29.18 kg/m. If this is out of the aforementioned range listed, please consider follow up with your Primary Care Provider.  If you are age 5 or younger, your body mass index should be between 19-25. Your Body mass index is 29.18 kg/m. If this is out of the aformentioned range listed, please consider follow up with your Primary Care Provider.   ________________________________________________________  The [place] [facility] providers would like to encourage you to use [facility] to communicate with providers for non-urgent requests or questions.  Due to long hold times on the telephone, sending your provider a message by [facility] may be a faster and more efficient way to get a response.  Please allow 48 business hours for a response.  Please remember that this is for non-urgent requests.   _______________________________________________________ It was a pleasure to see you today!  Thank you for trusting me with your gastrointestinal care!    Miralax is an osmotic laxative.  It only brings more water into the stool.  This is safe to take daily.  Can take up to 17 gram of miralax twice a day.  Mix with juice or coffee.  Start 1 capful at night for 3-4 days and reassess your response in 3-4 days.  You can increase and decrease the dose based on your response.  Remember, it can take up to 3-4 days to take effect OR for the effects to wear off.   I often pair this with benefiber in the morning to help assure the stool is not too loose.

## 2022-05-16 DIAGNOSIS — M1711 Unilateral primary osteoarthritis, right knee: Secondary | ICD-10-CM | POA: Diagnosis not present

## 2022-05-23 DIAGNOSIS — M1711 Unilateral primary osteoarthritis, right knee: Secondary | ICD-10-CM | POA: Diagnosis not present

## 2022-05-29 ENCOUNTER — Ambulatory Visit: Payer: Medicare Other | Admitting: Obstetrics & Gynecology

## 2022-06-08 ENCOUNTER — Telehealth: Payer: Self-pay | Admitting: *Deleted

## 2022-06-08 NOTE — Telephone Encounter (Signed)
Call returned to patient.   Patient is out of town the rest of the year, states she left her pessary, asking for a new one to be mailed. Patient is unsure of what type of pessary she has. States she believes it was placed at [facility].   Reviewed OV notes for Dr. Seymour Bars, dr. Jerolyn Center and records from [facility]. No pessary information located.   Advised patient we would need to confirm pessary type and size to order. Advised I will also have Dr. Seymour Bars review, our office will f/u. Advised patient she may contact [facility] for additional records regarding the specific pessary. Advised we keep limited stock in office, would likely have to order once identified. Patient verbalizes understanding.    Dr. Seymour Bars -do you have any additional information on pessary?

## 2022-06-13 NOTE — Telephone Encounter (Signed)
Alexis Del, MD  You5 days ago    No info on her pessary in my note of 01/2022, she didn't have it in the vagina at that visit.  She could take a picture of her pessary and read the numbers on the pessary to figure out the size. Dr. Pennie Banter with patient. Patient states she found her second pessary at the location she is at currently. No additional pessary needed. Patient appreciative of follow-up call.   Routing to provider for final review. Patient is agreeable to disposition. Will close encounter.

## 2022-07-04 ENCOUNTER — Telehealth: Payer: Self-pay | Admitting: Cardiology

## 2022-07-04 NOTE — Telephone Encounter (Signed)
Patient states the original doctor who prescribed her CPAP left town and no one has been monitoring her for sleep. Patient states she has not been getting any sleep and doesn't think her equipment is working properly.

## 2022-07-04 NOTE — Telephone Encounter (Signed)
Pt will be establishing care with Dr. Mayford Knife on 11/22, being Dr. Shari Prows will be leaving the practice at the end of July.   Will send a message to Dr. Norris Cross sleep coordinator and scheduler to help coordinate a sooner appt with Dr. Mayford Knife, to further manage her sleep apnea/supplies, being the pts current sleep MD has retired.    Dr. Mayford Knife will also be her General Cardiologist and scheduled for 11/22 to see her. Pt will more than likely need a sooner appt with Dr. Mayford Knife for sleep management.

## 2022-07-04 NOTE — Telephone Encounter (Signed)
Called and left detailed message per DPR asking patient to call our office so we can schedule her to be seen by Dr. Mayford Knife for sleep apnea.

## 2022-07-13 NOTE — Telephone Encounter (Signed)
Pt scheduled for 12/07/22 w/ Turner.

## 2022-08-27 ENCOUNTER — Other Ambulatory Visit: Payer: Self-pay | Admitting: Obstetrics & Gynecology

## 2022-08-28 NOTE — Telephone Encounter (Signed)
Medication refill request: estrace vaginal cream Last AEX:  05-24-21 Next AEX: not scheduled. Message sent to scheduling department to schedule Last MMG (if hormonal medication request): 03-29-22 birads 1:neg Refill authorized: please approve or deny as appropriate

## 2022-08-29 NOTE — Telephone Encounter (Signed)
Spoke w/ pt and informed her that rx was sent for one tube of cream yesterday and if using correctly-should last her about 3.5 months. Advised her to cb and schedule appt at her earliest convenience for when she is due to return. Pt voiced understanding.   Routing to provider for final review.

## 2022-08-29 NOTE — Telephone Encounter (Signed)
Pt LVM in triage line stating that she was advised to make an appt. However, states that she is out of state until end of 11/2022 and is requesting a cb.   LVMTCB.  Rx refill sent yesterday for one 42.5g tube.

## 2022-09-07 DIAGNOSIS — M25551 Pain in right hip: Secondary | ICD-10-CM | POA: Diagnosis not present

## 2022-09-07 DIAGNOSIS — M1611 Unilateral primary osteoarthritis, right hip: Secondary | ICD-10-CM | POA: Diagnosis not present

## 2022-10-02 DIAGNOSIS — G4733 Obstructive sleep apnea (adult) (pediatric): Secondary | ICD-10-CM | POA: Diagnosis not present

## 2022-12-04 DIAGNOSIS — M1611 Unilateral primary osteoarthritis, right hip: Secondary | ICD-10-CM | POA: Diagnosis not present

## 2022-12-04 DIAGNOSIS — M67911 Unspecified disorder of synovium and tendon, right shoulder: Secondary | ICD-10-CM | POA: Diagnosis not present

## 2022-12-04 DIAGNOSIS — M1711 Unilateral primary osteoarthritis, right knee: Secondary | ICD-10-CM | POA: Diagnosis not present

## 2022-12-07 ENCOUNTER — Ambulatory Visit: Payer: Medicare Other | Attending: Cardiology | Admitting: Cardiology

## 2022-12-07 ENCOUNTER — Telehealth: Payer: Self-pay | Admitting: Radiology

## 2022-12-07 ENCOUNTER — Encounter: Payer: Self-pay | Admitting: Cardiology

## 2022-12-07 VITALS — BP 130/84 | HR 61 | Ht 64.5 in | Wt 167.0 lb

## 2022-12-07 DIAGNOSIS — E782 Mixed hyperlipidemia: Secondary | ICD-10-CM

## 2022-12-07 DIAGNOSIS — I2510 Atherosclerotic heart disease of native coronary artery without angina pectoris: Secondary | ICD-10-CM

## 2022-12-07 DIAGNOSIS — I10 Essential (primary) hypertension: Secondary | ICD-10-CM | POA: Diagnosis not present

## 2022-12-07 DIAGNOSIS — Z79899 Other long term (current) drug therapy: Secondary | ICD-10-CM

## 2022-12-07 DIAGNOSIS — G4733 Obstructive sleep apnea (adult) (pediatric): Secondary | ICD-10-CM

## 2022-12-07 DIAGNOSIS — R079 Chest pain, unspecified: Secondary | ICD-10-CM

## 2022-12-07 MED ORDER — METOPROLOL TARTRATE 50 MG PO TABS: 50.0000 mg | ORAL_TABLET | Freq: Once | ORAL | 0 refills | Status: DC

## 2022-12-07 NOTE — Addendum Note (Signed)
Addended by: Luellen Pucker on: 12/07/2022 04:06 PM   Modules accepted: Orders

## 2022-12-07 NOTE — Progress Notes (Signed)
Cardiology Office Note:    Date:  12/07/2022   ID:  Alexis Braun, Alexis Braun 04-03-1950, MRN [id number]  PCP:  Melida Quitter, MD   [facility] Providers Cardiologist:  None {  Referring MD: Melida Quitter, MD    History of Present Illness:    Alexis Braun is a 72 y.o. female with a hx of anxiety, GERD and HTN and seen in 2018 by Dr. Eden Emms for chest pain and palpitations. She has a remote hx of normal coronary CTA with cor Ca score of 0 and no CAD. TTE 05/2016 showed normal BiV function and no significant valve disease. Cardiac monitor 04/2016 showed occasional PACs but no arrhythmias. ETT 04/2016 showed hypertensive response during exercise with no evidence of ischemia. . Her calcium score 03/2021 was 56 which is 69th percentile for age, sex, and race.   She also has a hx of OSA dx at least 8 years ago with [facility]. She has been on OSA and really struggles with it.  She was seen by ENT for workup for Inspire device and was told that she was a candidate but decided not to pursue it.  She has lost 30lbs and wants to know if she even still has OSA. She still gets sleepy some during the day and has to nap.  She does not wake herself up snoring or gasping for breath.    She is here today for followup and is doing well.  She exercise a lot and lately she has noticed chest pain with exertion and stops when she rests.  She says that this is the same pain that she had in 2018 when she had a cardiac workup and it radiates into her left neck and is pressure related. She denies any SOB, DOE, PND, orthopnea, dizziness, palpitations or syncope. She does have chronic venous inusff and neuropathy. She is compliant with her meds and is tolerating meds with no SE.    Past Medical History:  Diagnosis Date   Allergy    sulfa   Anxiety    Arthritis    Bladder prolapse, female, acquired    Chest pain    per pt, over couple years   Congenital hearing loss    right   Gallbladder polyp  2014   GERD (gastroesophageal reflux disease)    Hepatitis    as teen-iv drugs   Hx of adenomatous and sessile serrated colonic polyps 12/24/2017   Hypertension    on medication   Internal hemorrhoid    Migraines    Ovarian cyst 04/03/1999   PONV (postoperative nausea and vomiting)    Positive hepatitis C antibody test    at age 36; has been told that she "self cured" No current or past treatment.   Sleep apnea    uses c-pap   Wears hearing aid    right    Past Surgical History:  Procedure Laterality Date   CHOLECYSTECTOMY N/A 10/08/2012   Procedure: LAPAROSCOPIC CHOLECYSTECTOMY WITH INTRAOPERATIVE CHOLANGIOGRAM;  Surgeon: Wilmon Arms. Corliss Skains, MD;  Location: [facility];  Service: General;  Laterality: N/A;   CHONDROPLASTY Right 03/31/2014   Procedure: CHONDROPLASTY;  Surgeon: Gean Birchwood, MD;  Location: [facility];  Service: Orthopedics;  Laterality: Right;   COLONOSCOPY     2007   DRUG INDUCED ENDOSCOPY N/A 03/08/2021   Procedure: DRUG INDUCED SLEEP ENDOSCOPY;  Surgeon: Christia Reading, MD;  Location: [facility];  Service: ENT;  Laterality: N/A;   HEMORRHOID  BANDING  2017   KNEE ARTHROSCOPY WITH LATERAL MENISECTOMY Right 03/31/2014   Procedure: KNEE ARTHROSCOPY WITH  PARTIAL LATERAL MENISECTOMY;  Surgeon: Gean Birchwood, MD;  Location: [facility];  Service: Orthopedics;  Laterality: Right;   KNEE ARTHROSCOPY WITH MEDIAL MENISECTOMY Right 03/31/2014   Procedure: KNEE ARTHROSCOPY WITH PARTIALMEDIAL MENISECTOMY;  Surgeon: Gean Birchwood, MD;  Location: [facility];  Service: Orthopedics;  Laterality: Right;   OOPHORECTOMY  2002   Right    scelero     SIGMOIDOSCOPY     1999    Current Medications: No outpatient medications have been marked as taking for the 12/07/22 encounter (Office Visit) with Quintella Reichert, MD.     Allergies:   Sulfa antibiotics   Social History   Socioeconomic History   Marital status: Single    Spouse  name: Not on file   Number of children: 0   Years of education: MA   Highest education level: Not on file  Occupational History   Occupation: Retired  Tobacco Use   Smoking status: Never   Smokeless tobacco: Never  Vaping Use   Vaping status: Never Used  Substance and Sexual Activity   Alcohol use: Yes    Comment: OCC   Drug use: No   Sexual activity: Not Currently    Partners: Male    Birth control/protection: Post-menopausal    Comment: younger than 75, more than 5  Other Topics Concern   Not on file  Social History Narrative   Patient lives at home alone.   occ caffeine; twice a week    Social Determinants of Health   Financial Resource Strain: Not on file  Food Insecurity: Not on file  Transportation Needs: Not on file  Physical Activity: Not on file  Stress: Not on file  Social Connections: Not on file     Family History: The patient's family history includes Dementia in her mother; Heart attack in her father; Heart disease in her father and mother; Migraines in her mother. There is no history of Colon cancer, Breast cancer, Esophageal cancer, Stomach cancer, or Rectal cancer.  ROS:   Please see the history of present illness.    Review of Systems  Constitutional:  Positive for malaise/fatigue. Negative for weight loss.  HENT:  Negative for congestion and sore throat.   Eyes:  Negative for blurred vision.  Respiratory:  Negative for cough and shortness of breath.   Cardiovascular:  Positive for chest pain (R-side) and palpitations. Negative for orthopnea, claudication, leg swelling and PND.  Gastrointestinal:  Negative for heartburn and nausea.  Genitourinary:  Negative for dysuria and urgency.  Musculoskeletal:  Positive for back pain and joint pain (R knee). Negative for myalgias.  Skin:  Negative for rash.  Neurological:  Positive for tingling (R cheek). Negative for dizziness and headaches.  Endo/Heme/Allergies:  Negative for environmental allergies.   Psychiatric/Behavioral:  The patient is not nervous/anxious and does not have insomnia.    All other systems reviewed and are negative.  EKGs/Labs/Other Studies Reviewed:    The following studies were reviewed today:  EKG Interpretation Date/Time:  Friday December 07 2022 15:27:49 EST Ventricular Rate:  61 PR Interval:  160 QRS Duration:  88 QT Interval:  382 QTC Calculation: 384 R Axis:   60  Text Interpretation: Normal sinus rhythm Normal ECG When compared with ECG of 01-Mar-2021 14:11, Questionable change in QRS axis T wave inversion no longer evident in Inferior leads Confirmed by Armanda Magic [phone])  on 12/07/2022 3:44:24 PM   CT Cardiac Score 04/03/21 IMPRESSION: Coronary calcium score of 56 is at the 69th percentile for the patient's age, sex and race.  ETT 04/2016: Blood pressure demonstrated a hypertensive response to exercise. There was no ST segment deviation noted during stress.   ETT with good exercise tolerance (10:00); no chest pain; hypertensive BP; no diagnostic ST changes; negative adequate ETT; [facility] treadmill score 10.  TTE 2018: Study Conclusions   - Left ventricle: The cavity size was normal. Wall thickness was    normal. Systolic function was normal. The estimated ejection    fraction was in the range of 55% to 60%. Wall motion was normal;    there were no regional wall motion abnormalities. The study is    not technically sufficient to allow evaluation of LV diastolic    function.   Impressions:   - Normal LV systolic function; trace MR.   -------------------------------------------------------------------  Study data:  Comparison was made to the study of 07/18/2007.  Study  status:  Routine.  Procedure:  The patient reported no pain pre or  post test. Transthoracic echocardiography. Image quality was  adequate.  Study completion:  There were no complications.  Transthoracic echocardiography.  M-mode, complete 2D, spectral  Doppler, and color  Doppler.  Birthdate:  Patient birthdate:  1950-12-22.  Age:  Patient is 72 yr old.  Sex:  Gender: female.  BMI: 29.9 kg/m^2.  Blood pressure:     130/70  Patient status:  Outpatient.  Study date:  Study date: 05/16/2016. Study time: 03:00  PM.  Location:  [facility]   -------------------------------------------------------------------   -------------------------------------------------------------------  Left ventricle:  The cavity size was normal. Wall thickness was  normal. Systolic function was normal. The estimated ejection  fraction was in the range of 55% to 60%. Wall motion was normal;  there were no regional wall motion abnormalities. The study is not  technically sufficient to allow evaluation of LV diastolic  function.   -------------------------------------------------------------------  Aortic valve:   Trileaflet; mildly calcified leaflets. Mobility was  not restricted.  Doppler:  Transvalvular velocity was within the  normal range. There was no stenosis. There was no regurgitation.     -------------------------------------------------------------------  Aorta:  Aortic root: The aortic root was normal in size.   -------------------------------------------------------------------  Mitral valve:   Structurally normal valve.   Mobility was not  restricted.  Doppler:  Transvalvular velocity was within the normal  range. There was no evidence for stenosis. There was trivial  regurgitation.    Peak gradient (D): 3 mm Hg.   -------------------------------------------------------------------  Left atrium:  The atrium was normal in size.   -------------------------------------------------------------------  Right ventricle:  The cavity size was normal. Systolic function was  normal.   -------------------------------------------------------------------  Pulmonic valve:    Doppler:  Transvalvular velocity was within the  normal range. There was no evidence for stenosis.  There was trivial  regurgitation.   -------------------------------------------------------------------  Tricuspid valve:   Structurally normal valve.    Doppler:  Transvalvular velocity was within the normal range. There was no  regurgitation.   -------------------------------------------------------------------  Right atrium:  The atrium was normal in size.   -------------------------------------------------------------------  Pericardium:  There was no pericardial effusion.   -------------------------------------------------------------------  Systemic veins:  Inferior vena cava: The vessel was normal in size.   EKG:   05/09/21: Sinus rhythm, rate 65 bpm  Recent Labs: No results found for requested labs within last 365 days.  Recent Lipid Panel  Component Value Date/Time   CHOL 196 05/10/2021 0730   TRIG 124 05/10/2021 0730   HDL 59 05/10/2021 0730   CHOLHDL 3.3 05/10/2021 0730   LDLCALC 115 (H) 05/10/2021 0730     Risk Assessment/Calculations:           Physical Exam:    VS:  There were no vitals taken for this visit.    Wt Readings from Last 3 Encounters:  05/14/22 170 lb (77.1 kg)  04/03/22 172 lb (78 kg)  01/22/22 175 lb (79.4 kg)     GEN: Well nourished, well developed in no acute distress HEENT: Normal NECK: No JVD; No carotid bruits LYMPHATICS: No lymphadenopathy CARDIAC:RRR, no murmurs, rubs, gallops RESPIRATORY:  Clear to auscultation without rales, wheezing or rhonchi  ABDOMEN: Soft, non-tender, non-distended MUSCULOSKELETAL:  No edema; No deformity  SKIN: Warm and dry NEUROLOGIC:  Alert and oriented x 3 PSYCHIATRIC:  Normal affect  ASSESSMENT:    1. Coronary artery disease involving native coronary artery of native heart without angina pectoris   2. Primary hypertension   3. Mixed hyperlipidemia     PLAN:    In order of problems listed above:  #CAD with elevated Ca score: #HLD: -Ca score 56 which is in 69% for age, gender, race  matched controls.  -Remains very active but has been having CP with exertion recently although similar quality to prior CP but now when she is exercising some. -I am going to get a coronary CTA to redefine coronary anatomy -I have personally reviewed and interpreted outside labs performed by patient's PCP which showed LDL 117 and HDL 71 in 2023>>LDL goal < 70 -she has not tolerate higher doses of Crestor -repeat FLP and ALT -continue prescription drug management with Crestor 20mg  daily with PRN refills -Continue lifestyle modifications  #HTN: -controlled on exam today -Continue prescription drug management with Atenolol 12.5mg  daily with PRN refills  #OSA -she is currently on CPAP but does not like it -she was worked up for [employer] and felt to be a candidate but then decided not to pursue it -she has lost 30lbs and wants to see if she still has OSA -will check an HST off CPAP to see if she still needs CPAP   Followup 1 year    Medication Adjustments/Labs and Tests Ordered: Current medicines are reviewed at length with the patient today.  Concerns regarding medicines are outlined above.  No orders of the defined types were placed in this encounter.  No orders of the defined types were placed in this encounter.  Signed, Armanda Magic, MD  12/07/2022 3:22 PM    [facility]

## 2022-12-07 NOTE — Patient Instructions (Signed)
Medication Instructions:  Your physician recommends that you continue on your current medications as directed. Please refer to the Current Medication list given to you today.  *If you need a refill on your cardiac medications before your next appointment, please call your pharmacy*   Lab Work: Please complete a FASTING lipid panel and a CMET as soon as possible in our lab.  If you have labs (blood work) drawn today and your tests are completely normal, you will receive your results only by: MyChart Message (if you have MyChart) OR A paper copy in the mail If you have any lab test that is abnormal or we need to change your treatment, we will call you to review the results.   Testing/Procedures:  Your physician has recommended that you have a home sleep study. This test records several body functions during sleep, including: brain activity, eye movement, oxygen and carbon dioxide blood levels, heart rate and rhythm, breathing rate and rhythm, the flow of air through your mouth and nose, snoring, body muscle movements, and chest and belly movement.    Your cardiac CT will be scheduled at:   [facility] [address] [place], [place] [address] [phone]   Please arrive at the [facility] and Children's Entrance (Entrance C2) of [facility] 30 minutes prior to test start time. You can use the FREE valet parking offered at entrance C (encouraged to control the heart rate for the test)  Proceed to the [facility] Radiology Department (first floor) to check-in and test prep.  All radiology patients and guests should use entrance C2 at [facility], accessed from [facility], even though the hospital's physical address listed is [address].     Please follow these instructions carefully (unless otherwise directed):  An IV will be required for this test and Nitroglycerin will be given.  Hold all erectile dysfunction medications at  least 3 days (72 hrs) prior to test. (Ie viagra, cialis, sildenafil, tadalafil, etc)   On the Night Before the Test: Be sure to Drink plenty of water. Do not consume any caffeinated/decaffeinated beverages or chocolate 12 hours prior to your test. Do not take any antihistamines 12 hours prior to your test.  On the Day of the Test: Drink plenty of water until 1 hour prior to the test. Do not eat any food 1 hour prior to test. You may take your regular medications prior to the test.  Take metoprolol (Lopressor) two hours prior to test. If you take Furosemide/Hydrochlorothiazide/Spironolactone, please HOLD on the morning of the test. FEMALES- please wear underwire-free bra if available, avoid dresses & tight clothing       After the Test: Drink plenty of water. After receiving IV contrast, you may experience a mild flushed feeling. This is normal. On occasion, you may experience a mild rash up to 24 hours after the test. This is not dangerous. If this occurs, you can take Benadryl 25 mg and increase your fluid intake. If you experience trouble breathing, this can be serious. If it is severe call 911 IMMEDIATELY. If it is mild, please call our office. If you take any of these medications: Glipizide/Metformin, Avandament, Glucavance, please do not take 48 hours after completing test unless otherwise instructed.  We will call to schedule your test 2-4 weeks out understanding that some insurance companies will need an authorization prior to the service being performed.   For more information and frequently asked questions, please visit our website :  [email or web address]  For non-scheduling related questions, please contact the cardiac imaging nurse navigator should you have any questions/concerns: Cardiac Imaging Nurse Navigators Direct Office Dial: [phone]   For scheduling needs, including cancellations and rescheduling, please call [place],  [phone].    Follow-Up: At [facility], you and your health needs are our priority.  As part of our continuing mission to provide you with exceptional heart care, we have created designated Provider Care Teams.  These Care Teams include your primary Cardiologist (physician) and Advanced Practice Providers (APPs -  Physician Assistants and Nurse Practitioners) who all work together to provide you with the care you need, when you need it.  We recommend signing up for the patient portal called "MyChart".  Sign up information is provided on this After Visit Summary.  MyChart is used to connect with patients for Virtual Visits (Telemedicine).  Patients are able to view lab/test results, encounter notes, upcoming appointments, etc.  Non-urgent messages can be sent to your provider as well.   To learn more about what you can do with MyChart, go to [email or web address].    Your next appointment:   1 year(s)  Provider:   Dr. Armanda Magic, MD

## 2022-12-07 NOTE — Telephone Encounter (Signed)
1. Patient agreement reviewed and signed on 12/07/22. 2. WatchPAT issued to patient on 12/07/22 by Rexene Edison. Patient aware to not open the WatchPAT box until contacted with the activation PIN. 3. Patient profile initialized in CloudPAT on  4. Device serial number: [id number]

## 2022-12-10 DIAGNOSIS — L718 Other rosacea: Secondary | ICD-10-CM | POA: Diagnosis not present

## 2022-12-10 DIAGNOSIS — H0288B Meibomian gland dysfunction left eye, upper and lower eyelids: Secondary | ICD-10-CM | POA: Diagnosis not present

## 2022-12-10 DIAGNOSIS — H16223 Keratoconjunctivitis sicca, not specified as Sjogren's, bilateral: Secondary | ICD-10-CM | POA: Diagnosis not present

## 2022-12-10 DIAGNOSIS — H0288A Meibomian gland dysfunction right eye, upper and lower eyelids: Secondary | ICD-10-CM | POA: Diagnosis not present

## 2022-12-16 DIAGNOSIS — I2510 Atherosclerotic heart disease of native coronary artery without angina pectoris: Secondary | ICD-10-CM | POA: Insufficient documentation

## 2022-12-16 HISTORY — DX: Atherosclerotic heart disease of native coronary artery without angina pectoris: I25.10

## 2022-12-17 NOTE — Telephone Encounter (Signed)
**Note De-Identified Alexis Braun Obfuscation** Ordering provider: Dr Mayford Knife Associated diagnoses: OSA-G47.33 and Fatigue-R53.83 WatchPAT PA obtained on 12/17/2022 by Alexis Braun, Alexis Formosa, LPN. Per the [employer] provider portal: The following solutions for the service date entered do not require Pre-Authorization by [facility], CPT Code: 19147 Patient notified of PIN (1234) on 12/17/2022 Alexis Braun Notification Method: MyChart message. I did leave a message on the pts cell phone VM (Ok per [facility]) advising her of the Pin # as well.  Phone note routed to covering staff for follow-up.

## 2022-12-20 DIAGNOSIS — I10 Essential (primary) hypertension: Secondary | ICD-10-CM | POA: Diagnosis not present

## 2022-12-21 ENCOUNTER — Encounter (INDEPENDENT_AMBULATORY_CARE_PROVIDER_SITE_OTHER): Payer: Medicare Other | Admitting: Cardiology

## 2022-12-21 DIAGNOSIS — G4733 Obstructive sleep apnea (adult) (pediatric): Secondary | ICD-10-CM | POA: Diagnosis not present

## 2022-12-25 ENCOUNTER — Telehealth (HOSPITAL_COMMUNITY): Payer: Self-pay | Admitting: *Deleted

## 2022-12-25 NOTE — Telephone Encounter (Signed)
Attempted to call patient regarding upcoming cardiac CT appointment. Left message on voicemail with name and callback number Hayley Sharpe RN Navigator Cardiac Imaging [facility] [phone] Office   

## 2022-12-25 NOTE — Telephone Encounter (Signed)
 Received call from patient regarding upcoming cardiac imaging study; pt verbalizes understanding of appt date/time, parking situation and where to check in, pre-test NPO status and medications ordered, and verified current allergies; name and call back number provided for further questions should they arise Johney Frame RN Navigator Cardiac Imaging [facility] [phone] office [phone] cell

## 2022-12-26 ENCOUNTER — Ambulatory Visit (HOSPITAL_COMMUNITY)
Admission: RE | Admit: 2022-12-26 | Discharge: 2022-12-26 | Disposition: A | Discharge status: Home / Self Care | Payer: Medicare Other | Source: Ambulatory Visit | Attending: Cardiology | Admitting: Cardiology

## 2022-12-26 DIAGNOSIS — R072 Precordial pain: Secondary | ICD-10-CM | POA: Diagnosis not present

## 2022-12-26 DIAGNOSIS — I2510 Atherosclerotic heart disease of native coronary artery without angina pectoris: Secondary | ICD-10-CM | POA: Insufficient documentation

## 2022-12-26 DIAGNOSIS — R079 Chest pain, unspecified: Secondary | ICD-10-CM | POA: Diagnosis not present

## 2022-12-26 MED ORDER — NITROGLYCERIN 0.4 MG SL SUBL
0.8000 mg | SUBLINGUAL_TABLET | Freq: Once | SUBLINGUAL | Status: AC
Start: 2022-12-26 — End: 2022-12-26
  Administered 2022-12-26: 0.8 mg via SUBLINGUAL

## 2022-12-26 MED ORDER — NITROGLYCERIN 0.4 MG SL SUBL
SUBLINGUAL_TABLET | SUBLINGUAL | Status: AC
  Filled 2022-12-26: qty 2

## 2022-12-26 MED ORDER — IOHEXOL 350 MG/ML SOLN
95.0000 mL | Freq: Once | INTRAVENOUS | Status: AC | PRN
  Administered 2022-12-26: 95 mL via INTRAVENOUS

## 2023-01-01 ENCOUNTER — Encounter: Payer: Self-pay | Admitting: Cardiology

## 2023-01-02 DIAGNOSIS — Z79899 Other long term (current) drug therapy: Secondary | ICD-10-CM | POA: Diagnosis not present

## 2023-01-02 DIAGNOSIS — E782 Mixed hyperlipidemia: Secondary | ICD-10-CM | POA: Diagnosis not present

## 2023-01-02 LAB — COMPREHENSIVE METABOLIC PANEL
ALT: 21 [IU]/L (ref 0–32)
AST: 19 [IU]/L (ref 0–40)
Albumin: 4.5 g/dL (ref 3.9–4.9)
Alkaline Phosphatase: 81 [IU]/L (ref 44–121)
BUN/Creatinine Ratio: 28 (ref 12–28)
BUN: 23 mg/dL (ref 8–27)
Bilirubin Total: 0.3 mg/dL (ref 0.0–1.2)
CO2: 24 mmol/L (ref 20–29)
Calcium: 10.2 mg/dL (ref 8.7–10.3)
Chloride: 103 mmol/L (ref 96–106)
Creatinine, Ser: 0.82 mg/dL (ref 0.57–1.00)
Globulin, Total: 1.9 g/dL (ref 1.5–4.5)
Glucose: 89 mg/dL (ref 70–99)
Potassium: 4.9 mmol/L (ref 3.5–5.2)
Sodium: 139 mmol/L (ref 134–144)
Total Protein: 6.4 g/dL (ref 6.0–8.5)
eGFR: 77 mL/min/{1.73_m2} (ref >59)

## 2023-01-02 LAB — LIPID PANEL
Chol/HDL Ratio: 2.9 {ratio} (ref 0.0–4.4)
Cholesterol, Total: 200 mg/dL — ABNORMAL HIGH (ref 100–199)
HDL: 69 mg/dL (ref >39)
LDL Chol Calc (NIH): 114 mg/dL — ABNORMAL HIGH (ref 0–99)
Triglycerides: 94 mg/dL (ref 0–149)
VLDL Cholesterol Cal: 17 mg/dL (ref 5–40)

## 2023-01-04 ENCOUNTER — Telehealth: Payer: Self-pay

## 2023-01-04 DIAGNOSIS — E782 Mixed hyperlipidemia: Secondary | ICD-10-CM

## 2023-01-04 NOTE — Telephone Encounter (Signed)
Call to patient to advise Coronary CTA showed a coronary calcium score of 54.9 with 25 to 49% mid LAD stenosis.  Advised patient to continue aspirin 81 mg daily and Crestor.  Repeat FLP and ALT results reviewed with patient who agrees to referral to lipid clinic, order placed. Patient denies any further chest pain at this time.

## 2023-01-04 NOTE — Telephone Encounter (Signed)
-----   Message from Armanda Magic sent at 01/01/2023 10:40 AM EST ----- Coronary CTA showed a coronary calcium score of 54.9 with 25 to 49% mid LAD stenosis.  Continue aspirin 81 mg daily and Crestor.  Please have her come in for repeat FLP and ALT.

## 2023-01-04 NOTE — Telephone Encounter (Signed)
Call to patient who verifies she has already completed home sleep study.

## 2023-01-08 ENCOUNTER — Ambulatory Visit: Payer: Medicare Other | Attending: Cardiology

## 2023-01-08 DIAGNOSIS — G4733 Obstructive sleep apnea (adult) (pediatric): Secondary | ICD-10-CM

## 2023-01-08 NOTE — Procedures (Signed)
SLEEP STUDY REPORT Patient Information Study Date: 12/21/2022 Patient Name: Alexis Braun Patient ID: [id number] Birth Date: 02/07/1950 Age: 72 Gender: Female BMI: 28.6 (W=167 lb, H=5' 4'') Stopbang: 4 Referring Physician: Armanda Magic, MD  TEST DESCRIPTION: Home sleep apnea testing was completed using the WatchPat, a Type 1 device, utilizing  peripheral arterial tonometry (PAT), chest movement, actigraphy, pulse oximetry, pulse rate, body position and snore.  AHI was calculated with apnea and hypopnea using valid sleep time as the denominator. RDI includes apneas,  hypopneas, and RERAs. The data acquired and the scoring of sleep and all associated events were performed in  accordance with the recommended standards and specifications as outlined in the AASM Manual for the Scoring of  Sleep and Associated Events 2.2.0 (2015).   FINDINGS:   1. Mild Obstructive Sleep Apnea with AHI 10.8/hr. Overall but moderate during REM sleep with REM AHI 26.8/hr  2. Mild Central Sleep Apnea with pAHIc 0.6/hr.   3. Oxygen desaturations as low as 87%.   4. Severe snoring was present. O2 sats were < 88% for 0.1 min.   5. Total sleep time was 5 hrs and 6 min.   6. 16.9% of total sleep time was spent in REM sleep.   7. Shortened sleep onset latency at 6 min.   8. Prolonged REM sleep onset latency at 106 min.   9. Total awakenings were 13.  10. Arrhythmia detection: None  DIAGNOSIS: Mild Obstructive Sleep Apnea (G47.33) overall and Moderate Obstructive Sleep Apnea during REM sleep.  RECOMMENDATIONS: 1. Clinical correlation of these findings is necessary. The decision to treat obstructive sleep apnea (OSA) is usually  based on the presence of apnea symptoms or the presence of associated medical conditions such as Hypertension,  Congestive Heart Failure, Atrial Fibrillation or Obesity. The most common symptoms of OSA are snoring, gasping for  breath while sleeping, daytime sleepiness and fatigue.    2. Initiating apnea therapy is recommended given the presence of symptoms and/or associated conditions.  Recommend proceeding with one of the following:   a. Auto-CPAP therapy with a pressure range of 5-20cm H2O.   b. An oral appliance (OA) that can be obtained from certain dentists with expertise in sleep medicine. These are  primarily of use in non-obese patients with mild and moderate disease.   c. An ENT consultation which may be useful to look for specific causes of obstruction and possible treatment  options.   d. If patient is intolerant to PAP therapy, consider referral to ENT for evaluation for hypoglossal nerve stimulator.   3. Close follow-up is necessary to ensure success with CPAP or oral appliance therapy for maximum benefit .  4. A follow-up oximetry study on CPAP is recommended to assess the adequacy of therapy and determine the need  for supplemental oxygen or the potential need for Bi-level therapy. An arterial blood gas to determine the adequacy of  baseline ventilation and oxygenation should also be considered.  5. Healthy sleep recommendations include: adequate nightly sleep (normal 7-9 hrs/night), avoidance of caffeine after  noon and alcohol near bedtime, and maintaining a sleep environment that is cool, dark and quiet.  6. Weight loss for overweight patients is recommended. Even modest amounts of weight loss can significantly  improve the severity of sleep apnea.  7. Snoring recommendations include: weight loss where appropriate, side sleeping, and avoidance of alcohol before  bed.  8. Operation of motor vehicle should be avoided when sleepy.  Signature: Armanda Magic, MD; [facility]; Diplomat, American  Board of Sleep  Medicine Electronically Signed: 01/08/2023 4:49:39 PM

## 2023-01-11 ENCOUNTER — Other Ambulatory Visit: Payer: Self-pay | Admitting: Obstetrics and Gynecology

## 2023-01-11 NOTE — Telephone Encounter (Signed)
Pt needs an appt

## 2023-01-11 NOTE — Telephone Encounter (Signed)
Medication refill request: estrace vaginal cream Last AEX:  05-24-21 Next AEX: not scheduled. Message sent to scheduling department to schedule in August. Pt did not schedule Last MMG (if hormonal medication request): 03-29-22 birads 1:neg Refill authorized: please approve or deny as appropriate

## 2023-01-17 ENCOUNTER — Telehealth: Payer: Self-pay

## 2023-01-17 NOTE — Telephone Encounter (Signed)
-----   Message from Wilbert Bihari sent at 01/08/2023  4:53 PM EST ----- Still has OSA but not severe enough to qualify for Inspire - recommend continuing with CPAP therapy.  Could consider referral to dentistry for oral device but they are $3K out of pocket and insurance does not cover - let me know what she decides to do

## 2023-01-17 NOTE — Telephone Encounter (Signed)
 Notified patient, via VM per DPR, of provider recommendations of continuing CPAP or referral for Oral Device. Left callback number for questions and concerns of treatment.

## 2023-01-18 NOTE — Telephone Encounter (Signed)
 Returned call to patient regarding her prescription refill.  Left VM (ok per DPR) that she needed an appointment.

## 2023-01-18 NOTE — Telephone Encounter (Signed)
 Pt LVM in triage line inquiring about this refill asking if appt is needed or if any other guidance clinical could advise.   Pt would prefer a CB on [phone] # in EMR.

## 2023-01-23 ENCOUNTER — Other Ambulatory Visit: Payer: Self-pay

## 2023-01-23 ENCOUNTER — Ambulatory Visit (HOSPITAL_BASED_OUTPATIENT_CLINIC_OR_DEPARTMENT_OTHER): Payer: Medicare Other | Attending: Orthopedic Surgery | Admitting: Physical Therapy

## 2023-01-23 ENCOUNTER — Encounter (HOSPITAL_BASED_OUTPATIENT_CLINIC_OR_DEPARTMENT_OTHER): Payer: Self-pay | Admitting: Physical Therapy

## 2023-01-23 DIAGNOSIS — M25511 Pain in right shoulder: Secondary | ICD-10-CM | POA: Diagnosis not present

## 2023-01-23 NOTE — Therapy (Signed)
 OUTPATIENT PHYSICAL THERAPY UPPER EXTREMITY EVALUATION   Patient Name: Alexis Braun MRN: [id number] DOB:12/31/1950, 73 y.o., female Today's Date: 01/24/2023  END OF SESSION:  PT End of Session - 01/24/23 1215     Visit Number 1    Number of Visits 12    Date for PT Re-Evaluation 03/07/23    PT Start Time 1345    PT Stop Time 1428    PT Time Calculation (min) 43 min    Activity Tolerance Patient tolerated treatment well    Behavior During Therapy [facility] for tasks assessed/performed             Past Medical History:  Diagnosis Date   Allergy    sulfa   Anxiety    Arthritis    Bladder prolapse, female, acquired    CAD (coronary artery disease), native coronary artery 12/2022   coronary calcium score of 54.9 with 25 to 49% mid LAD stenosis   Chest pain    per pt, over couple years   Congenital hearing loss    right   Gallbladder polyp 2014   GERD (gastroesophageal reflux disease)    Hepatitis    as teen-iv drugs   Hx of adenomatous and sessile serrated colonic polyps 12/24/2017   Hypertension    on medication   Internal hemorrhoid    Migraines    Ovarian cyst 04/03/1999   PONV (postoperative nausea and vomiting)    Positive hepatitis C antibody test    at age 22; has been told that she self cured No current or past treatment.   Sleep apnea    uses c-pap   Wears hearing aid    right   Past Surgical History:  Procedure Laterality Date   CHOLECYSTECTOMY N/A 10/08/2012   Procedure: LAPAROSCOPIC CHOLECYSTECTOMY WITH INTRAOPERATIVE CHOLANGIOGRAM;  Surgeon: Donnice POUR. Belinda, MD;  Location: [facility] OR;  Service: General;  Laterality: N/A;   CHONDROPLASTY Right 03/31/2014   Procedure: CHONDROPLASTY;  Surgeon: Dempsey Sensor, MD;  Location: [facility];  Service: Orthopedics;  Laterality: Right;   COLONOSCOPY     2007   DRUG INDUCED ENDOSCOPY N/A 03/08/2021   Procedure: DRUG INDUCED SLEEP ENDOSCOPY;  Surgeon: Carlie Clark, MD;  Location: [facility];  Service: ENT;  Laterality: N/A;   HEMORRHOID BANDING  2017   KNEE ARTHROSCOPY WITH LATERAL MENISECTOMY Right 03/31/2014   Procedure: KNEE ARTHROSCOPY WITH  PARTIAL LATERAL MENISECTOMY;  Surgeon: Dempsey Sensor, MD;  Location: [facility];  Service: Orthopedics;  Laterality: Right;   KNEE ARTHROSCOPY WITH MEDIAL MENISECTOMY Right 03/31/2014   Procedure: KNEE ARTHROSCOPY WITH PARTIALMEDIAL MENISECTOMY;  Surgeon: Dempsey Sensor, MD;  Location: [facility];  Service: Orthopedics;  Laterality: Right;   OOPHORECTOMY  2002   Right    scelero     SIGMOIDOSCOPY     1999   Patient Active Problem List   Diagnosis Date Noted   CAD (coronary artery disease), native coronary artery 12/2022   Dysphagia 04/21/2020   LLQ abdominal pain 04/15/2019   Gassiness 04/15/2019   Hx of adenomatous and sessile serrated colonic polyps 12/24/2017   Bladder prolapse, female, acquired    Fecal soiling due to fecal incontinence 06/17/2015   Perianal dermatitis 06/17/2015   Hemorrhoids, internal, with bleeding 06/11/2014   Internal hemorrhoids 05/21/2014   Gallbladder polyp 09/30/2012   Migraine 08/12/2012   GERD (gastroesophageal reflux disease) 01/30/2012   Mixed hyperlipidemia 05/01/2011   Chest pain 05/04/2010    PCP: Leita  Wile MD  REFERRING PROVIDER: Dempsey Sensor MD   REFERRING DIAG:   THERAPY DIAG:  Acute pain of right shoulder  Rationale for Evaluation and Treatment: Rehabilitation  ONSET DATE: 4 months ago  SUBJECTIVE:                                                                                                                                                                                      SUBJECTIVE STATEMENT: About 4 months prior patient was picking up an object and felt a pull in her shoulder.  Since that point she has had the increased pain when swimming.  Patient is an avid counselling psychologist.  She has modified her from crawl to be more pain-free but when she  does her butterfly she has pain when she reaches up.  She also has pain when she reaches back in the car.  Pain feels like it stretches across her pec and into her medial arm.  The pain is not changed over the past 4 months.  Hand dominance: Right  PERTINENT HISTORY:   PAIN:  Are you having pain? Yes: NPRS scale: Can reach up to a 4 out of 10 Pain location: Right anterior shoulder into the right medial arm Pain description: Aching Aggravating factors: Reaching back and swelling Relieving factors: Not putting it in position of pain  PRECAUTIONS: None  RED FLAGS: None   WEIGHT BEARING RESTRICTIONS: No  FALLS:  Has patient fallen in last 6 months? No  LIVING ENVIRONMENT: Nothing pertinent  OCCUPATION: Retired   Presenter, Broadcasting:  Hotel Manager, trail running; Yoga, pilaites, skier, golfer     PLOF: Independent  PATIENT GOALS:  To have less pain and to have a plan for her shoulder   NEXT MD VISIT:  Noting scheduled   OBJECTIVE:  Note: Objective measures were completed at Evaluation unless otherwise noted.  DIAGNOSTIC FINDINGS:    PATIENT SURVEYS :  FOTO    COGNITION: Overall cognitive status: Within functional limits for tasks assessed     SENSATION: Denies paresthesias  POSTURE: Good posture   UPPER EXTREMITY ROM:   Active ROM Right eval Left eval  Shoulder flexion Full without pain   Shoulder extension    Shoulder abduction    Shoulder adduction    Shoulder internal rotation Tightness at end range   Shoulder external rotation Pain when she goes to extreme endrange overhead   Elbow flexion    Elbow extension    Wrist flexion    Wrist extension    Wrist ulnar deviation    Wrist radial deviation    Wrist pronation    Wrist supination    (Blank rows = not tested)  Full passive range of motion hypomobile into external rotation  UPPER EXTREMITY MMT:  MMT Right eval Left eval  Shoulder flexion 4+ 4+  Shoulder extension    Shoulder abduction     Shoulder adduction    Shoulder internal rotation 5 5  Shoulder external rotation 4+ 4+  Middle trapezius    Lower trapezius    Elbow flexion    Elbow extension    Wrist flexion    Wrist extension    Wrist ulnar deviation    Wrist radial deviation    Wrist pronation    Wrist supination    Grip strength (lbs)    (Blank rows = not tested)  SHOULDER SPECIAL TESTS: Impingement tests: Hawkins (-)    JOINT MOBILITY TESTING:  Generally hypermobile in her shoulders  PALPATION:  Tight in upper trap                                                                                                                              TREATMENT DATE:  Access Code: [email or web address] URL: [email or web address] Date: 01/24/2023 Prepared by: Alm Don  Exercises - Seated Upper Trapezius Stretch  - 1 x daily - 3-4 x weekly - 3 sets - 3 reps - 20 sec  hold - Doorway Pec Stretch at 90 Degrees Abduction  - 1 x daily - 7 x weekly - 3 sets - 3 reps - 15 sec  hold - Theracane Over Shoulder  - 1 x daily - 7 x weekly - 3 sets - 10 reps - Supine Shoulder Flexion Extension AAROM with Dowel  - 1 x daily - 7 x weekly - 3 sets - 5 reps - 10 sec  hold - Supine Shoulder Alphabet  - 1 x daily - 7 x weekly - 3 sets - 10 reps - Shoulder extension with resistance - Neutral  - 1 x daily - 3-4 x weekly - 3 sets - 10 reps - Scapular Retraction with Resistance  - 1 x daily - 3-4 x weekly - 3 sets - 10 reps - Shoulder External Rotation with Anchored Resistance  - 1 x daily - 3-4 x weekly - 3 sets - 10 reps - Standing Shoulder Internal Rotation with Anchored Resistance  - 1 x daily - 3-4 x weekly - 3 sets - 10 reps  Reviewed self soft tissue mobilization of upper traps   PATIENT EDUCATION: Education details: HEP,  Person educated: Patient Education method: Explanation, Demonstration, Tactile cues, and Verbal cues handout  Education comprehension: verbalized understanding, returned demonstration, verbal cues  required, tactile cues required, and needs further education  HOME EXERCISE PROGRAM: Access Code: [email or web address] URL: [email or web address] Date: 01/24/2023 Prepared by: Alm Don  Exercises - Seated Upper Trapezius Stretch  - 1 x daily - 3-4 x weekly - 3 sets - 3 reps - 20 sec  hold - Doorway Pec Stretch at 90 Degrees Abduction  - 1 x daily - 7 x weekly - 3 sets -  3 reps - 15 sec  hold - Theracane Over Shoulder  - 1 x daily - 7 x weekly - 3 sets - 10 reps - Supine Shoulder Flexion Extension AAROM with Dowel  - 1 x daily - 7 x weekly - 3 sets - 5 reps - 10 sec  hold - Supine Shoulder Alphabet  - 1 x daily - 7 x weekly - 3 sets - 10 reps - Shoulder extension with resistance - Neutral  - 1 x daily - 3-4 x weekly - 3 sets - 10 reps - Scapular Retraction with Resistance  - 1 x daily - 3-4 x weekly - 3 sets - 10 reps - Shoulder External Rotation with Anchored Resistance  - 1 x daily - 3-4 x weekly - 3 sets - 10 reps - Standing Shoulder Internal Rotation with Anchored Resistance  - 1 x daily - 3-4 x weekly - 3 sets - 10 reps  ASSESSMENT:  CLINICAL IMPRESSION: Patient is a 73 year old female who presents with anterior shoulder pain which radiates into her medial arm with endrange external rotation activities such as swimming and reaching back in her car.  She has no significant tenderness to palpation in her pec or medial arm at this time.  She does have a large trigger point in the posterior rotator cuff muscles which may not be related, but she was shown how to do self soft tissue mobilization to the area.  She has mild tightness in her upper traps.  She would benefit from skilled therapy to develop a stabilization program to help her return to her swimming.  She will be going to [place]  for 6 months starting in February.  We will follow-up 1 time to finalize HEP before she goes on her trip. OBJECTIVE IMPAIRMENTS: decreased strength, impaired UE functional use, and pain.   ACTIVITY  LIMITATIONS: carrying and swimming and reaching  PARTICIPATION LIMITATIONS:  Exercise  PERSONAL FACTORS none  REHAB POTENTIAL: Good  CLINICAL DECISION MAKING: Stable/uncomplicated  EVALUATION COMPLEXITY: Low  GOALS: Goals reviewed with patient? Yes  STG equals LTG  LONG TERM GOALS: Target date: 03/07/2023    Patient will reach fully behind head without increased right shoulder pain Baseline:  Goal status: INITIAL  2.  Patient will reach for person backseat without right shoulder pain Baseline:  Goal status: INITIAL  3.  Patient will complete exercise program to bring with her to [place]  Baseline:  Goal status: INITIAL  4.  Patient will return swelling without increased pain Baseline:  Goal status: INITIAL  PLAN: PT FREQUENCY: 2x/week  PT DURATION: 6 weeks  PLANNED INTERVENTIONS: Therapeutic exercises, Therapeutic activity, Neuromuscular re-education, Balance training, Gait training, Patient/Family education, Self Care, Joint mobilization, Aquatic Therapy, Dry Needling, Electrical stimulation, Cryotherapy, Moist heat, Taping, Manual therapy, and Re-evaluation.   PLAN FOR NEXT SESSION: Review current HEP progress as tolerated consider soft soft tissue mobilization if beneficial.  Reviewed gym exercises.   Alm JINNY Don, PT 01/24/2023, 12:17 PM

## 2023-01-24 ENCOUNTER — Encounter (HOSPITAL_BASED_OUTPATIENT_CLINIC_OR_DEPARTMENT_OTHER): Payer: Self-pay | Admitting: Physical Therapy

## 2023-01-24 DIAGNOSIS — G4733 Obstructive sleep apnea (adult) (pediatric): Secondary | ICD-10-CM | POA: Diagnosis not present

## 2023-01-31 DIAGNOSIS — H16223 Keratoconjunctivitis sicca, not specified as Sjogren's, bilateral: Secondary | ICD-10-CM | POA: Diagnosis not present

## 2023-01-31 DIAGNOSIS — L718 Other rosacea: Secondary | ICD-10-CM | POA: Diagnosis not present

## 2023-01-31 DIAGNOSIS — H0288B Meibomian gland dysfunction left eye, upper and lower eyelids: Secondary | ICD-10-CM | POA: Diagnosis not present

## 2023-01-31 DIAGNOSIS — H0288A Meibomian gland dysfunction right eye, upper and lower eyelids: Secondary | ICD-10-CM | POA: Diagnosis not present

## 2023-02-01 ENCOUNTER — Telehealth: Payer: Self-pay | Admitting: Cardiology

## 2023-02-01 NOTE — Telephone Encounter (Signed)
Patient stated she will be going out of state around 22 February and wants to know if her Pharmacy referral visit can be virtual.

## 2023-02-04 ENCOUNTER — Encounter: Payer: Self-pay | Admitting: Obstetrics and Gynecology

## 2023-02-04 ENCOUNTER — Ambulatory Visit (INDEPENDENT_AMBULATORY_CARE_PROVIDER_SITE_OTHER): Payer: Medicare Other | Admitting: Obstetrics and Gynecology

## 2023-02-04 VITALS — BP 96/68 | HR 56 | Ht 63.75 in | Wt 166.0 lb

## 2023-02-04 DIAGNOSIS — Z1331 Encounter for screening for depression: Secondary | ICD-10-CM | POA: Diagnosis not present

## 2023-02-04 DIAGNOSIS — Z01419 Encounter for gynecological examination (general) (routine) without abnormal findings: Secondary | ICD-10-CM | POA: Diagnosis not present

## 2023-02-04 DIAGNOSIS — R350 Frequency of micturition: Secondary | ICD-10-CM | POA: Diagnosis not present

## 2023-02-04 DIAGNOSIS — Z9189 Other specified personal risk factors, not elsewhere classified: Secondary | ICD-10-CM | POA: Diagnosis not present

## 2023-02-04 DIAGNOSIS — R8290 Unspecified abnormal findings in urine: Secondary | ICD-10-CM

## 2023-02-04 DIAGNOSIS — B009 Herpesviral infection, unspecified: Secondary | ICD-10-CM

## 2023-02-04 MED ORDER — ESTRADIOL 0.1 MG/GM VA CREA: TOPICAL_CREAM | VAGINAL | 0 refills | Status: AC

## 2023-02-04 NOTE — Progress Notes (Signed)
73 y.o. y.o. female here for annual exam. No LMP recorded. Patient is postmenopausal.    G2P0A2 Single.  Spends the summer/fall in [place].   RP:  Established patient presenting for annual gyn exam    HPI: H/O Rt Oophorectomy.  Post menopause on no HRT.  No PMB.  No pelvic pain.  Abstinent. Pap Neg 04/2020.  No h/o abnormal Pap. Uses Estrace cream twice a week. Cystocele, not using the pessary anymore, better with PT for pelvic floor.  Seen by Dr Melvyn Neth, planning Cystocele Repair with Sling Procedure when back from [place].  Has a pessary.  Cleans it weekly and uses estrace with it.  Mild SUI improved.  Breasts normal. Mammo Neg 01/2021.  BMI 29.46.  Enjoys swimming.  Health labs with Fam MD.  Alen Bleacher 01/2021 repeat in 5 years with polyps.  Will repeat BD at age 64-referral placed. Body mass index is 28.72 kg/m.     02/04/2023   11:01 AM  Depression screen PHQ 2/9  Decreased Interest 0  Down, Depressed, Hopeless 0  PHQ - 2 Score 0    Blood pressure 96/68, pulse (!) 56, height 5' 3.75" (1.619 m), weight 166 lb (75.3 kg), SpO2 98%.     Component Value Date/Time   DIAGPAP  04/28/2020 1636    - Negative for intraepithelial lesion or malignancy (NILM)   ADEQPAP  04/28/2020 1636    Satisfactory for evaluation; transformation zone component PRESENT.    GYN HISTORY:    Component Value Date/Time   DIAGPAP  04/28/2020 1636    - Negative for intraepithelial lesion or malignancy (NILM)   ADEQPAP  04/28/2020 1636    Satisfactory for evaluation; transformation zone component PRESENT.    OB History  Gravida Para Term Preterm AB Living  2    2 0  SAB IAB Ectopic Multiple Live Births      0    # Outcome Date GA Lbr Len/2nd Weight Sex Type Anes PTL Lv  2 AB           1 AB             Past Medical History:  Diagnosis Date   Allergy    sulfa   Anxiety    Arthritis    Bladder prolapse, female, acquired    CAD (coronary artery disease), native  coronary artery 12/2022   coronary calcium score of 54.9 with 25 to 49% mid LAD stenosis   Chest pain    per pt, over couple years   Congenital hearing loss    right   Gallbladder polyp 2014   GERD (gastroesophageal reflux disease)    Hepatitis    as teen-iv drugs   Hx of adenomatous and sessile serrated colonic polyps 12/24/2017   Hypertension    on medication   Internal hemorrhoid    Migraines    Ovarian cyst 04/03/1999   PONV (postoperative nausea and vomiting)    Positive hepatitis C antibody test    at age 64; has been told that she "self cured" No current or past treatment.   Sleep apnea    uses c-pap   Wears hearing aid    right    Past Surgical History:  Procedure Laterality Date   CHOLECYSTECTOMY N/A 10/08/2012   Procedure: LAPAROSCOPIC CHOLECYSTECTOMY WITH INTRAOPERATIVE CHOLANGIOGRAM;  Surgeon: Wilmon Arms. Corliss Skains, MD;  Location: [facility];  Service: General;  Laterality: N/A;   CHONDROPLASTY Right 03/31/2014   Procedure: CHONDROPLASTY;  Surgeon:  Gean Birchwood, MD;  Location: [facility];  Service: Orthopedics;  Laterality: Right;   COLONOSCOPY     2007   DRUG INDUCED ENDOSCOPY N/A 03/08/2021   Procedure: DRUG INDUCED SLEEP ENDOSCOPY;  Surgeon: Christia Reading, MD;  Location: [facility];  Service: ENT;  Laterality: N/A;   HEMORRHOID BANDING  2017   KNEE ARTHROSCOPY WITH LATERAL MENISECTOMY Right 03/31/2014   Procedure: KNEE ARTHROSCOPY WITH  PARTIAL LATERAL MENISECTOMY;  Surgeon: Gean Birchwood, MD;  Location: [facility];  Service: Orthopedics;  Laterality: Right;   KNEE ARTHROSCOPY WITH MEDIAL MENISECTOMY Right 03/31/2014   Procedure: KNEE ARTHROSCOPY WITH PARTIALMEDIAL MENISECTOMY;  Surgeon: Gean Birchwood, MD;  Location: [facility];  Service: Orthopedics;  Laterality: Right;   OOPHORECTOMY  2002   Right    scelero     SIGMOIDOSCOPY     1999    Current Outpatient Medications on File Prior to Visit  Medication Sig  Dispense Refill   acetaminophen-codeine (TYLENOL #3) 300-30 MG tablet      ALPRAZolam (XANAX) 0.5 MG tablet Take 0.5 mg by mouth at bedtime as needed for sleep.     aspirin EC 81 MG tablet Take 81 mg by mouth daily.     atenolol (TENORMIN) 25 MG tablet Take 12.5 mg by mouth daily.     CEQUA 0.09 % SOLN Apply 1 drop to eye 2 (two) times daily.     cyclobenzaprine (FLEXERIL) 10 MG tablet Take 10 mg by mouth 3 (three) times daily as needed for muscle spasms.     desoximetasone (TOPICORT) 0.25 % cream      estradiol (ESTRACE) 0.1 MG/GM vaginal cream NEW INSTRUCTIONS:  INSERT 0.5 GRAM VAGINALLY 3 TIMES A WEEK AS NEEDED FOR SYMPTOM RELIEF. 42.5 g 0   famotidine (PEPCID) 20 MG tablet Take 20 mg by mouth daily.     Flaxseed, Linseed, (FLAX SEED OIL PO) Take 1 capsule by mouth daily.     HYDROcodone-acetaminophen (NORCO/VICODIN) 5-325 MG tablet Take 1 tablet by mouth daily as needed.     meloxicam (MOBIC) 15 MG tablet Take 15 mg by mouth daily.     MIEBO 1.338 GM/ML SOLN      Omega-3 Fatty Acids (FISH OIL) 1000 MG CAPS Take 1 capsule by mouth daily.     omeprazole (PRILOSEC) 20 MG capsule Take by mouth.     pantoprazole (PROTONIX) 40 MG tablet Take 1 tablet (40 mg total) by mouth daily. 90 tablet 0   psyllium (REGULOID) 0.52 g capsule Take 0.52 g by mouth daily.     rosuvastatin (CRESTOR) 5 MG tablet Take 5 mg by mouth daily.     SUMAtriptan (IMITREX) 100 MG tablet Take 100 mg by mouth daily as needed for migraine. May repeat in 2 hours if headache persists or recurs.     traZODone (DESYREL) 50 MG tablet Take 50 mg by mouth at bedtime as needed for sleep.     triazolam (HALCION) 0.25 MG tablet      valACYclovir (VALTREX) 500 MG tablet Take 1 tablet (500 mg total) by mouth daily. (Patient taking differently: Take 500 mg by mouth as needed.) 90 tablet 3   VEVYE 0.1 % SOLN      doxycycline (ADOXA) 50 MG tablet Take by mouth. (Patient not taking: Reported on 02/04/2023)     metoprolol tartrate (LOPRESSOR)  50 MG tablet Take 1 tablet (50 mg total) by mouth once for 1 dose. Take 90-120 minutes prior to scan.  Hold for SBP less than 110. 1 tablet 0   No current facility-administered medications on file prior to visit.    Social History   Socioeconomic History   Marital status: Single    Spouse name: Not on file   Number of children: 0   Years of education: MA   Highest education level: Not on file  Occupational History   Occupation: Retired  Tobacco Use   Smoking status: Never   Smokeless tobacco: Never  Vaping Use   Vaping status: Never Used  Substance and Sexual Activity   Alcohol use: Yes    Comment: rarely   Drug use: No   Sexual activity: Yes    Partners: Male    Birth control/protection: Post-menopausal    Comment: younger than 58, more than 5  Other Topics Concern   Not on file  Social History Narrative   Patient lives at home alone.   occ caffeine; twice a week    Social Drivers of Corporate investment banker Strain: Not on file  Food Insecurity: Not on file  Transportation Needs: Not on file  Physical Activity: Not on file  Stress: Not on file  Social Connections: Not on file  Intimate Partner Violence: Not on file    Family History  Problem Relation Age of Onset   Heart disease Father    Heart attack Father    Heart disease Mother    Dementia Mother    Migraines Mother    Colon cancer Neg Hx    Breast cancer Neg Hx    Esophageal cancer Neg Hx    Stomach cancer Neg Hx    Rectal cancer Neg Hx      Allergies  Allergen Reactions   Sulfa Antibiotics     Was a child        Patient's last menstrual period was No LMP recorded. Patient is postmenopausal..             Review of Systems Alls systems reviewed and are negative.     Physical Exam Constitutional:      Appearance: Normal appearance.  Genitourinary:     Vulva and urethral meatus normal.     No lesions in the vagina.     Right Labia: No rash, lesions or skin changes.    Left  Labia: No lesions, skin changes or rash.    No vaginal discharge or tenderness.     Anterior vaginal prolapse present.    Moderate vaginal atrophy present.     Right Adnexa: not tender, not palpable and no mass present.    Left Adnexa: not tender, not palpable and no mass present.    No cervical motion tenderness or discharge.     Uterus is not enlarged, tender or irregular.  Breasts:    Right: Normal.     Left: Normal.  HENT:     Head: Normocephalic.  Neck:     Thyroid: No thyroid mass, thyromegaly or thyroid tenderness.  Cardiovascular:     Rate and Rhythm: Normal rate and regular rhythm.     Heart sounds: Normal heart sounds, S1 normal and S2 normal.  Pulmonary:     Effort: Pulmonary effort is normal.     Breath sounds: Normal breath sounds and air entry.  Abdominal:     General: There is no distension.     Palpations: Abdomen is soft. There is no mass.     Tenderness: There is no abdominal tenderness. There is no guarding or  rebound.  Musculoskeletal:        General: Normal range of motion.     Cervical back: Full passive range of motion without pain, normal range of motion and neck supple. No tenderness.     Right lower leg: No edema.     Left lower leg: No edema.  Neurological:     Mental Status: She is alert.  Skin:    General: Skin is warm.  Psychiatric:        Mood and Affect: Mood normal.        Behavior: Behavior normal.        Thought Content: Thought content normal.  Vitals and nursing note reviewed. Exam conducted with a chaperone present.       A:         Well Woman GYN exam                             P:        Pap smear not indicated Encouraged annual mammogram screening Colon cancer screening up-to-date DXA ordered today Labs and immunizations to do with PMD Discussed breast self exams Encouraged healthy lifestyle practices Encouraged Vit D and Calcium   No follow-ups on file.  Earley Favor

## 2023-02-04 NOTE — Telephone Encounter (Signed)
Patient scheduled in person on Friday

## 2023-02-05 LAB — URINE CULTURE
MICRO NUMBER:: 15976876
Result:: NO GROWTH
SPECIMEN QUALITY:: ADEQUATE

## 2023-02-05 LAB — URINALYSIS, COMPLETE W/RFL CULTURE
Bacteria, UA: NONE SEEN /HPF
Bilirubin Urine: NEGATIVE
Glucose, UA: NEGATIVE
Hyaline Cast: NONE SEEN /[LPF]
Ketones, ur: NEGATIVE
Leukocyte Esterase: NEGATIVE
Nitrites, Initial: NEGATIVE
Protein, ur: NEGATIVE
Specific Gravity, Urine: 1.02 (ref 1.001–1.035)
pH: 5.5 (ref 5.0–8.0)

## 2023-02-05 LAB — CULTURE INDICATED

## 2023-02-06 ENCOUNTER — Encounter: Payer: Self-pay | Admitting: Obstetrics and Gynecology

## 2023-02-08 ENCOUNTER — Ambulatory Visit: Payer: Medicare Other | Attending: Cardiology | Admitting: Pharmacist

## 2023-02-08 ENCOUNTER — Other Ambulatory Visit (HOSPITAL_BASED_OUTPATIENT_CLINIC_OR_DEPARTMENT_OTHER): Payer: Self-pay

## 2023-02-08 DIAGNOSIS — E782 Mixed hyperlipidemia: Secondary | ICD-10-CM | POA: Diagnosis not present

## 2023-02-08 MED ORDER — AREXVY 120 MCG/0.5ML IM SUSR
0.5000 mL | Freq: Once | INTRAMUSCULAR | 0 refills | Status: AC
  Filled 2023-02-08: qty 0.5, 1d supply, fill #0

## 2023-02-08 NOTE — Progress Notes (Unsigned)
Patient ID: Alexis Braun                 DOB: 1950/03/25                    MRN: [id number]      HPI: Alexis Braun is a 73 y.o. female patient referred to lipid clinic by Dr. Mayford Knife. PMH is significant for anxiety, GERD, OSA and HTN. CAC score in 2024 was 54.9 (64th percentile) Total plaque volume (TPV) 41 mm3 which is 23rd percentile for age- and sex-matched controls (calcified plaque 16 mm3; non-calcified plaque 25 mm3 (low attenuation plaque 1 mm3)). TPV is mild.  Patient saw Dr. Mayford Knife in Nov 2024. Repeat lipids in Dec showed an LDL-C of 114.   Patient presents today to lipid clinic.  She states she has been taking rosuvastatin 3 times a week because taking it daily causes muscle aches.  She is not all that interested in taking additional medication.  She is very active.  She swims, runs, bikes, does Pilates and yoga.  She does have a knee shoulder and hip issues that has slowed her down some but she still swims rides the [employer] and does yoga.  She states that she thinks her biggest issue for her cardiovascular health is her sleep.  Most recent sleep test shows mild sleep apnea.  She feels like she does not sleep well with her CPAP.  When she wears her CPAP she sleeps with her mouth open which causes her mouth to be very dry.  She also has issues with dry eye.  She does sometimes take medications to help her sleep.  No issues with falling asleep, bigger issue staying asleep.   Overall her diet is very good.  Admits that she probably eats too much trays and drinks too much milk and will try to cut back on this.  She does not eat red meat.  Eats plenty of vegetables.  Mesa 10-year risk score with CAC 5.2%.  We did briefly discuss changing statins versus adding ezetimibe versus Repatha however patient politely declined any additional medication therapy.  Feels as though her risk is relatively low and will continue to take the rosuvastatin 3 times a week.  Current Medications: rosuvastatin  5mg  three times a week Intolerances:  Risk Factors: HTN, age LDL-C goal: <70 ApoB goal:   Diet: "clean eater Doesn't eat red meat Chicken, fish No flour Low sugar Yogurt- low fat Drinks milk Cauliflower, broccoli, a lot of salads, butternut squash, sweet potato, green beans, kale, black beans  Exercise: run, swims, bikes, golf, Pilates, yoga (exercises 5 days a week)  Family History: The patient's family history includes Dementia in her mother; Heart attack in her father; Heart disease in her father and mother; Migraines in her mother. There is no history of Colon cancer, Breast cancer, Esophageal cancer, Stomach cancer, or Rectal cancer.   Social History: 4 glasses of wine per month, no tobacco  Labs: Lipid Panel     Component Value Date/Time   CHOL 200 (H) 01/02/2023 0823   TRIG 94 01/02/2023 0823   HDL 69 01/02/2023 0823   CHOLHDL 2.9 01/02/2023 0823   LDLCALC 114 (H) 01/02/2023 0823   LABVLDL 17 01/02/2023 0823    Past Medical History:  Diagnosis Date   Allergy    sulfa   Anxiety    Arthritis    Bladder prolapse, female, acquired    CAD (coronary artery disease), native coronary artery 12/2022  coronary calcium score of 54.9 with 25 to 49% mid LAD stenosis   Chest pain    per pt, over couple years   Congenital hearing loss    right   Gallbladder polyp 2014   GERD (gastroesophageal reflux disease)    Hepatitis    as teen-iv drugs   Hx of adenomatous and sessile serrated colonic polyps 12/24/2017   Hypertension    on medication   Internal hemorrhoid    Migraines    Ovarian cyst 04/03/1999   PONV (postoperative nausea and vomiting)    Positive hepatitis C antibody test    at age 27; has been told that she "self cured" No current or past treatment.   Sleep apnea    uses c-pap   Wears hearing aid    right    Current Outpatient Medications on File Prior to Visit  Medication Sig Dispense Refill   ALPRAZolam (XANAX) 0.5 MG tablet Take 0.5 mg by mouth at  bedtime as needed for sleep.     aspirin EC 81 MG tablet Take 81 mg by mouth daily.     atenolol (TENORMIN) 25 MG tablet Take 12.5 mg by mouth daily.     CEQUA 0.09 % SOLN Apply 1 drop to eye 2 (two) times daily.     cyclobenzaprine (FLEXERIL) 10 MG tablet Take 10 mg by mouth 3 (three) times daily as needed for muscle spasms.     doxycycline (ADOXA) 50 MG tablet Take by mouth.     estradiol (ESTRACE) 0.1 MG/GM vaginal cream NEW INSTRUCTIONS:  INSERT 0.5 GRAM VAGINALLY 3 TIMES A WEEK AS NEEDED FOR SYMPTOM RELIEF. 42.5 g 0   famotidine (PEPCID) 20 MG tablet Take 20 mg by mouth daily.     Flaxseed, Linseed, (FLAX SEED OIL PO) Take 1 capsule by mouth daily.     meloxicam (MOBIC) 15 MG tablet Take 15 mg by mouth daily.     Omega-3 Fatty Acids (FISH OIL) 1000 MG CAPS Take 1 capsule by mouth daily.     psyllium (REGULOID) 0.52 g capsule Take 0.52 g by mouth daily.     rosuvastatin (CRESTOR) 5 MG tablet Take 5 mg by mouth 3 (three) times a week.     SUMAtriptan (IMITREX) 100 MG tablet Take 100 mg by mouth daily as needed for migraine. May repeat in 2 hours if headache persists or recurs.     traZODone (DESYREL) 50 MG tablet Take 50 mg by mouth at bedtime as needed for sleep.     valACYclovir (VALTREX) 500 MG tablet Take 1 tablet (500 mg total) by mouth daily. (Patient taking differently: Take 500 mg by mouth as needed.) 90 tablet 3   desoximetasone (TOPICORT) 0.25 % cream      MIEBO 1.338 GM/ML SOLN      VEVYE 0.1 % SOLN      No current facility-administered medications on file prior to visit.    Allergies  Allergen Reactions   Sulfa Antibiotics     Was a child      Assessment/Plan:  1. Hyperlipidemia -  Mixed hyperlipidemia Assessment: LDL-C is above goal of less than 70 Non-HDL cholesterol 131, goal less than 100 Mesa 10-year risk score 5.2% equals low risk Patient with optimization of most modifiable risk factors Very little alcohol intake and no tobacco use Very active Not  interested in more medications We did briefly discuss changing statins versus adding ezetimibe versus Repatha however patient politely declined any additional medication therapy.  Feels as  though her risk is relatively low and will continue to take the rosuvastatin 3 times a week.  Plan: Continue rosuvastatin 3 times a week Cut back on high saturated fat dairy products    Thank you,  Kamy Poinsett D Tyris Eliot, Pharm.D, BCACP, CPP [facility] [address], [place], [place] [address]  Phone: [phone]; Fax: [phone]

## 2023-02-08 NOTE — Assessment & Plan Note (Signed)
Assessment: LDL-C is above goal of less than 70 Non-HDL cholesterol 131, goal less than 100 Mesa 10-year risk score 5.2% equals low risk Patient with optimization of most modifiable risk factors Very little alcohol intake and no tobacco use Very active Not interested in more medications We did briefly discuss changing statins versus adding ezetimibe versus Repatha however patient politely declined any additional medication therapy.  Feels as though her risk is relatively low and will continue to take the rosuvastatin 3 times a week.  Plan: Continue rosuvastatin 3 times a week Cut back on high saturated fat dairy products

## 2023-02-14 DIAGNOSIS — K080 Exfoliation of teeth due to systemic causes: Secondary | ICD-10-CM | POA: Diagnosis not present

## 2023-02-27 DIAGNOSIS — K080 Exfoliation of teeth due to systemic causes: Secondary | ICD-10-CM | POA: Diagnosis not present

## 2023-03-04 DIAGNOSIS — N951 Menopausal and female climacteric states: Secondary | ICD-10-CM | POA: Diagnosis not present

## 2023-03-11 ENCOUNTER — Other Ambulatory Visit: Payer: Self-pay | Admitting: Obstetrics and Gynecology

## 2023-03-11 DIAGNOSIS — Z1231 Encounter for screening mammogram for malignant neoplasm of breast: Secondary | ICD-10-CM

## 2023-04-01 ENCOUNTER — Ambulatory Visit
Admission: RE | Admit: 2023-04-01 | Discharge: 2023-04-01 | Disposition: A | Discharge status: Home / Self Care | Payer: Medicare Other | Source: Ambulatory Visit | Attending: Obstetrics and Gynecology | Admitting: Obstetrics and Gynecology

## 2023-04-01 DIAGNOSIS — Z1231 Encounter for screening mammogram for malignant neoplasm of breast: Secondary | ICD-10-CM | POA: Diagnosis not present

## 2023-04-01 DIAGNOSIS — N958 Other specified menopausal and perimenopausal disorders: Secondary | ICD-10-CM | POA: Diagnosis not present

## 2023-04-17 DIAGNOSIS — M25511 Pain in right shoulder: Secondary | ICD-10-CM | POA: Diagnosis not present

## 2023-04-18 ENCOUNTER — Encounter: Payer: Self-pay | Admitting: Cardiology

## 2023-04-18 ENCOUNTER — Other Ambulatory Visit: Payer: Self-pay | Admitting: Physician Assistant

## 2023-04-18 DIAGNOSIS — R55 Syncope and collapse: Secondary | ICD-10-CM

## 2023-04-18 DIAGNOSIS — R002 Palpitations: Secondary | ICD-10-CM

## 2023-04-18 NOTE — Telephone Encounter (Signed)
 Spoke with pt. Pt stated she increased her Atenolol 25 mg herself and has been taking it like that for about a year. She stated she ran out and started to take 12.5 mg again. A 14 day zio heart monitor was ordered. Pt aware. Instructions will be sent on MyChart. A copy of her Itamar sleep study will be printed off and sent to the pt. Pt aware and agreeable. Pt verbalized understanding. All questions, if any, were answered.

## 2023-04-18 NOTE — Progress Notes (Signed)
 While doing ordinary household duties, pt had onset of a rapid and pounding heart.   No way to know her HR at that time.   This was associated w/ feeling light-headed and that she would fall if she did not sit down.   Sx resolved after a few minutes.  She then checked her BP and HR >> both WNL.   She is very concerned about these sx.  Messaged Dr Mayford Knife who recommended a 2 week Zio monitor, then a f/u appt.   She would likely feel more comfortable if the monitor were applied in the office.   Monitor is ordered, will send a message about the f/u appt.   Theodore Demark, PA-C 04/18/2023 7:55 PM

## 2023-04-19 MED ORDER — ATENOLOL 25 MG PO TABS: 25.0000 mg | ORAL_TABLET | Freq: Every day | ORAL | 3 refills | Status: AC

## 2023-04-19 NOTE — Addendum Note (Signed)
 Addended by: Erick Alley on: 04/19/2023 02:50 PM   Modules accepted: Orders

## 2023-04-22 ENCOUNTER — Other Ambulatory Visit (INDEPENDENT_AMBULATORY_CARE_PROVIDER_SITE_OTHER)

## 2023-04-22 DIAGNOSIS — R55 Syncope and collapse: Secondary | ICD-10-CM

## 2023-04-22 NOTE — Progress Notes (Unsigned)
Enrolled for [employer] to mail a ZIO AT Live Telemetry monitor to patients address on file.   Dr. Turner to read. 

## 2023-04-23 ENCOUNTER — Encounter: Payer: Self-pay | Admitting: Cardiology

## 2023-05-08 DIAGNOSIS — J019 Acute sinusitis, unspecified: Secondary | ICD-10-CM | POA: Diagnosis not present

## 2023-05-08 DIAGNOSIS — R059 Cough, unspecified: Secondary | ICD-10-CM | POA: Diagnosis not present

## 2023-05-11 DIAGNOSIS — J069 Acute upper respiratory infection, unspecified: Secondary | ICD-10-CM | POA: Diagnosis not present

## 2023-05-26 DIAGNOSIS — R55 Syncope and collapse: Secondary | ICD-10-CM | POA: Diagnosis not present

## 2023-05-28 ENCOUNTER — Encounter: Payer: Self-pay | Admitting: Obstetrics and Gynecology

## 2023-06-06 ENCOUNTER — Encounter: Payer: Self-pay | Admitting: Cardiology

## 2023-06-07 ENCOUNTER — Telehealth: Payer: Self-pay | Admitting: Cardiology

## 2023-06-07 NOTE — Telephone Encounter (Signed)
 Pt would like to reschedule her Mychart tele visit. Please advise

## 2023-06-14 ENCOUNTER — Ambulatory Visit: Attending: Cardiology | Admitting: Cardiology

## 2023-06-14 ENCOUNTER — Encounter: Payer: Self-pay | Admitting: Cardiology

## 2023-06-14 VITALS — Ht 64.0 in | Wt 163.0 lb

## 2023-06-14 DIAGNOSIS — I2510 Atherosclerotic heart disease of native coronary artery without angina pectoris: Secondary | ICD-10-CM

## 2023-06-14 DIAGNOSIS — E782 Mixed hyperlipidemia: Secondary | ICD-10-CM

## 2023-06-14 DIAGNOSIS — I10 Essential (primary) hypertension: Secondary | ICD-10-CM | POA: Diagnosis not present

## 2023-06-14 DIAGNOSIS — G4733 Obstructive sleep apnea (adult) (pediatric): Secondary | ICD-10-CM | POA: Diagnosis not present

## 2023-06-14 NOTE — Progress Notes (Signed)
 Virtual Visit via Video Note   Because of Nakyiah Kuck Garside's co-morbid illnesses, she is at least at moderate risk for complications without adequate follow up.  This format is felt to be most appropriate for this patient at this time.  All issues noted in this document were discussed and addressed.  A limited physical exam was performed with this format.  Please refer to the patient's chart for her consent to telehealth for [facility].       Date:  06/14/2023   ID:  Janique Hoefer, DOB 1950/09/19, MRN [id number] The patient was identified using 2 identifiers.  Patient Location: Home Provider Location: Office/Clinic   PCP:  Azalia Leo, MD   [facility] Providers Cardiologist:  None     Evaluation Performed:  Follow-Up Visit  Chief Complaint:  osa  History of Present Illness:    Charlayne Vultaggio is a 73 y.o. female with a hx of anxiety, GERD and HTN and seen in 2018 by Dr. Stann Earnest for chest pain and palpitations. She has a remote hx of normal coronary CTA with cor Ca score of 0 and no CAD. TTE 05/2016 showed normal BiV function and no significant valve disease. Cardiac monitor 04/2016 showed occasional PACs but no arrhythmias. ETT 04/2016 showed hypertensive response during exercise with no evidence of ischemia. . Her calcium score 03/2021 was 56 which is 69th percentile for age, sex, and race.   At last office visit 01/04/2023 she underwent a coronary CTA which demonstrated a coronary calcium score of 54.9 with 25 to 49% mid LAD.  Medical therapy was recommended.   She also has a hx of OSA dx at least 8 years ago with [facility]. She has been on OSA and really struggles with it.  She was seen by ENT for workup for Inspire device and was told that she was a candidate but decided not to pursue it.  She has lost 30lbs and at last office visit wanted to to know if she still had OSA.  She underwent home sleep study on 12/21/2022 showing mild  obstructive sleep apnea with an AHI of 10.8/h overall but moderate during REM sleep with a REM AHI of 26.8/h.  It was recommended that she continue on CPAP therapy.  She is doing well with her PAP device and thinks that she has gotten used to it.  She tolerates the mask and feels the pressure is adequate.  Since going on PAP she feels rested in the am and has no significant daytime sleepiness.  She denies any significant mouth or nasal dryness or nasal congestion.  She does not think that he snores.     She is here today for followup and is doing well.  She denies any chest pain or pressure, SOB, DOE, PND, orthopnea, LE edema, dizziness, palpitations or syncope. She is compliant with her meds and is tolerating meds with no SE.    Past Medical History:  Diagnosis Date   Allergy    sulfa   Anxiety    Arthritis    Bladder prolapse, female, acquired    CAD (coronary artery disease), native coronary artery 12/2022   coronary calcium score of 54.9 with 25 to 49% mid LAD stenosis   Chest pain    per pt, over couple years   Congenital hearing loss    right   Gallbladder polyp 2014   GERD (gastroesophageal reflux disease)    Hepatitis  as teen-iv drugs   Hx of adenomatous and sessile serrated colonic polyps 12/24/2017   Hypertension    on medication   Internal hemorrhoid    Migraines    Ovarian cyst 04/03/1999   PONV (postoperative nausea and vomiting)    Positive hepatitis C antibody test    at age 89; has been told that she "self cured" No current or past treatment.   Sleep apnea    uses c-pap   Wears hearing aid    right   Past Surgical History:  Procedure Laterality Date   CHOLECYSTECTOMY N/A 10/08/2012   Procedure: LAPAROSCOPIC CHOLECYSTECTOMY WITH INTRAOPERATIVE CHOLANGIOGRAM;  Surgeon: Kari Otto. Eli Grizzle, MD;  Location: [facility] OR;  Service: General;  Laterality: N/A;   CHONDROPLASTY Right 03/31/2014   Procedure: CHONDROPLASTY;  Surgeon: Wendolyn Hamburger, MD;  Location: [facility];  Service: Orthopedics;  Laterality: Right;   COLONOSCOPY     2007   DRUG INDUCED ENDOSCOPY N/A 03/08/2021   Procedure: DRUG INDUCED SLEEP ENDOSCOPY;  Surgeon: Virgina Grills, MD;  Location: [facility];  Service: ENT;  Laterality: N/A;   HEMORRHOID BANDING  2017   KNEE ARTHROSCOPY WITH LATERAL MENISECTOMY Right 03/31/2014   Procedure: KNEE ARTHROSCOPY WITH  PARTIAL LATERAL MENISECTOMY;  Surgeon: Wendolyn Hamburger, MD;  Location: [facility];  Service: Orthopedics;  Laterality: Right;   KNEE ARTHROSCOPY WITH MEDIAL MENISECTOMY Right 03/31/2014   Procedure: KNEE ARTHROSCOPY WITH PARTIALMEDIAL MENISECTOMY;  Surgeon: Wendolyn Hamburger, MD;  Location:  SURGERY CENTER;  Service: Orthopedics;  Laterality: Right;   OOPHORECTOMY  2002   Right    scelero     SIGMOIDOSCOPY     1999     Current Meds  Medication Sig   ALPRAZolam (XANAX) 0.5 MG tablet Take 0.5 mg by mouth at bedtime as needed for sleep.   aspirin EC 81 MG tablet Take 81 mg by mouth daily.   atenolol  (TENORMIN ) 25 MG tablet Take 1 tablet (25 mg total) by mouth daily.   cyclobenzaprine (FLEXERIL) 10 MG tablet Take 10 mg by mouth 3 (three) times daily as needed for muscle spasms.   estradiol  (ESTRACE ) 0.1 MG/GM vaginal cream NEW INSTRUCTIONS:  INSERT 0.5 GRAM VAGINALLY 3 TIMES A WEEK AS NEEDED FOR SYMPTOM RELIEF.   famotidine (PEPCID) 20 MG tablet Take 20 mg by mouth daily.   Flaxseed, Linseed, (FLAX SEED OIL PO) Take 1 capsule by mouth daily.   meloxicam (MOBIC) 15 MG tablet Take 15 mg by mouth daily.   MIEBO 1.338 GM/ML SOLN    psyllium (REGULOID) 0.52 g capsule Take 0.52 g by mouth daily.   rosuvastatin (CRESTOR) 5 MG tablet Take 5 mg by mouth 3 (three) times a week.   SUMAtriptan (IMITREX) 100 MG tablet Take 100 mg by mouth daily as needed for migraine. May repeat in 2 hours if headache persists or recurs.   traZODone (DESYREL) 50 MG tablet Take 50 mg by mouth at bedtime as needed for sleep.      Allergies:   Sulfa antibiotics   Social History   Tobacco Use   Smoking status: Never   Smokeless tobacco: Never  Vaping Use   Vaping status: Never Used  Substance Use Topics   Alcohol  use: Yes    Comment: rarely   Drug use: No     Family Hx: The patient's family history includes Dementia in her mother; Heart attack in her father; Heart disease in her father and mother; Migraines in her mother. There  is no history of Colon cancer, Breast cancer, Esophageal cancer, Stomach cancer, Rectal cancer, or BRCA 1/2.  ROS:   Please see the history of present illness.     All other systems reviewed and are negative.   Prior Sleep studies:   The following studies were reviewed today:  Home sleep study, PAP compliance download, coronary CTA  Labs/Other Tests and Data Reviewed:     Recent Labs: 01/02/2023: ALT 21; BUN 23; Creatinine, Ser 0.82; Potassium 4.9; Sodium 139   Wt Readings from Last 3 Encounters:  06/14/23 163 lb (73.9 kg)  02/04/23 166 lb (75.3 kg)  12/07/22 167 lb (75.8 kg)     Objective:    Vital Signs:  Ht 5\' 4"  (1.626 m)   Wt 163 lb (73.9 kg)   BMI 27.98 kg/m    VITAL SIGNS:  reviewed GEN:  no acute distress EYES:  sclerae anicteric, EOMI - Extraocular Movements Intact RESPIRATORY:  normal respiratory effort, symmetric expansion CARDIOVASCULAR:  no peripheral edema SKIN:  no rash, lesions or ulcers. MUSCULOSKELETAL:  no obvious deformities. NEURO:  alert and oriented x 3, no obvious focal deficit PSYCH:  normal affect  ASSESSMENT & PLAN:    OSA - The patient is tolerating PAP therapy well without any problems. The PAP download performed by his DME was personally reviewed and interpreted by me today and showed an AHI of 1.7/hr on auto CPAP from 4 to 10 cm H2O with 77% compliance in using more than 4 hours nightly.  The patient has been using and benefiting from PAP use and will continue to benefit from therapy.   ASCAD -coronary CTA 01/04/2023  which demonstrated a coronary calcium score of 54.9 with 25 to 49% mid LAD -Denies any anginal symptoms>>no further CP like she had in December -Continue aspirin 81 mg daily, atenolol  25 mg daily, Crestor 5 mg 3 times weekly with as needed refills  Hypertension - BP controlled - Continue Tenormin  25 mg daily with as needed refills  Hyperlipidemia -LDL goal less than 70 -Continue Crestor 5 mg 3 times weekly -I have personally reviewed and interpreted outside labs performed by patient's PCP which showed LDL 114, HDL 69 and TAGs 94 on 01/02/2024 -she was seen by PharmD in lipid clinic and declined any further therapy -she says that she has changed her diet radically and would like her lipids repeated>>repeat FLP and ALT    Total time of encounter: 20 minutes total time of encounter, including 15 minutes spent in face-to-face patient care on the date of this encounter. This time includes coordination of care and counseling regarding above mentioned problem list. Remainder of non-face-to-face time involved reviewing chart documents/testing relevant to the patient encounter and documentation in the medical record. I have independently reviewed documentation from referring provider.    Medication Adjustments/Labs and Tests Ordered: Current medicines are reviewed at length with the patient today.  Concerns regarding medicines are outlined above.   Tests Ordered: No orders of the defined types were placed in this encounter.   Medication Changes: No orders of the defined types were placed in this encounter.   Follow Up:  In Person in 1 year(s)  Signed, Gaylyn Keas, MD  06/14/2023 8:56 AM    [facility]

## 2023-06-14 NOTE — Telephone Encounter (Signed)
 DME IS [facility]  Upon patient request DME selection is [facility]. Patient understands he will be contacted by [facility] to set up his cpap. Patient understands to call if [facility] does not contact him with new setup in a timely manner. Patient understands they will be called once confirmation has been received from [facility] that they have received their new machine to schedule 10 week follow up appointment.   [facility] notified of new cpap order  Please add to airview Patient was grateful for the call and thanked me.

## 2023-06-27 DIAGNOSIS — R55 Syncope and collapse: Secondary | ICD-10-CM | POA: Diagnosis not present

## 2023-07-01 ENCOUNTER — Telehealth: Payer: Self-pay | Admitting: Obstetrics and Gynecology

## 2023-07-01 DIAGNOSIS — E2839 Other primary ovarian failure: Secondary | ICD-10-CM

## 2023-07-01 NOTE — Telephone Encounter (Signed)
Dxa

## 2023-07-02 ENCOUNTER — Ambulatory Visit: Payer: Self-pay | Admitting: Cardiology

## 2023-07-02 ENCOUNTER — Encounter: Payer: Self-pay | Admitting: Cardiology

## 2023-07-02 DIAGNOSIS — I4729 Other ventricular tachycardia: Secondary | ICD-10-CM | POA: Insufficient documentation

## 2023-07-02 DIAGNOSIS — I471 Supraventricular tachycardia, unspecified: Secondary | ICD-10-CM | POA: Insufficient documentation

## 2023-07-11 ENCOUNTER — Telehealth: Payer: Self-pay | Admitting: Cardiology

## 2023-07-11 NOTE — Telephone Encounter (Signed)
-----   Message from Wilbert Bihari sent at 07/02/2023  5:14 PM EDT ----- Heart monitor showed 1 episode of a fast heart rate from the bottom of the heart called NSVT lasting 13 beats at 182bpm and 2 episodes of a fast heart rate from the top of the heart called SVT lasint  11 beats at 171bpm.  There were rare extra heart beats from the top and bottom of the heart. Please set her up for a Stress PET CT.    I tried calling patient but no answer - goes straight to voicemail so could not consent  ----- Message ----- From: Shermon Shona KANDICE DEVONNA Sent: 06/29/2023   7:30 AM EDT To: Wilbert JONELLE Bihari, MD  Wanted to make sure you saw this, I have not shared the results with her.  RGB ----- Message ----- From: Bihari Wilbert JONELLE, MD Sent: 06/27/2023   3:35 PM EDT To: Shona KANDICE Shermon, PA-C

## 2023-07-11 NOTE — Telephone Encounter (Signed)
 Call to patient to advise of heart monitor results. Left detailed message per DPR that Dr. Shlomo advises that she do a stress/pet/ct. Order placed, advised patient to call our office or send [facility] message if any questions.

## 2023-07-11 NOTE — Telephone Encounter (Signed)
 Patient wants a call back to discuss having the NM PET CT CARDIAC PERFUSION MULTI W/ABSOLUTE BLOODFLOW test.  Patient noted she will be out of [place] until the end of October.

## 2023-07-11 NOTE — Telephone Encounter (Signed)
 Spoke with pt, she wants to make sure it will be okay for her to have the testing once she returns to the state in October. Aware will forward to dr turner to review.

## 2023-07-11 NOTE — Addendum Note (Signed)
 Addended by: JANIT GENI CROME on: 07/11/2023 01:56 PM   Modules accepted: Orders

## 2023-07-12 ENCOUNTER — Telehealth (HOSPITAL_BASED_OUTPATIENT_CLINIC_OR_DEPARTMENT_OTHER): Payer: Self-pay

## 2023-07-15 NOTE — Telephone Encounter (Signed)
Attempted phone call to pt and left voicemail message to contact triage at [phone]. 

## 2023-07-15 NOTE — Telephone Encounter (Signed)
 Returned patient's call, tried both phone numbers listed on DPR, no answer. Left messages on both explaining I could help her with scheduling her tests before she leaves town. Also Sent [facility] message.

## 2023-07-15 NOTE — Telephone Encounter (Signed)
 Patient is returning phone call. Patient mentioned wanting to cancel an appointment. Patient was upset about the wait time from our high call volume and when trying to verify if she want to schedule the test or cancel the order/appointment she stated  I guess I will have to wait till next week and disconnected the line. Please advise.

## 2023-08-05 DIAGNOSIS — M47816 Spondylosis without myelopathy or radiculopathy, lumbar region: Secondary | ICD-10-CM | POA: Diagnosis not present

## 2023-08-05 DIAGNOSIS — M5136 Other intervertebral disc degeneration, lumbar region with discogenic back pain only: Secondary | ICD-10-CM | POA: Diagnosis not present

## 2023-08-05 DIAGNOSIS — M9903 Segmental and somatic dysfunction of lumbar region: Secondary | ICD-10-CM | POA: Diagnosis not present

## 2023-08-05 DIAGNOSIS — M9904 Segmental and somatic dysfunction of sacral region: Secondary | ICD-10-CM | POA: Diagnosis not present

## 2023-08-07 DIAGNOSIS — M9903 Segmental and somatic dysfunction of lumbar region: Secondary | ICD-10-CM | POA: Diagnosis not present

## 2023-08-07 DIAGNOSIS — M5136 Other intervertebral disc degeneration, lumbar region with discogenic back pain only: Secondary | ICD-10-CM | POA: Diagnosis not present

## 2023-08-07 DIAGNOSIS — M47816 Spondylosis without myelopathy or radiculopathy, lumbar region: Secondary | ICD-10-CM | POA: Diagnosis not present

## 2023-08-07 DIAGNOSIS — M9904 Segmental and somatic dysfunction of sacral region: Secondary | ICD-10-CM | POA: Diagnosis not present

## 2023-08-27 DIAGNOSIS — M9904 Segmental and somatic dysfunction of sacral region: Secondary | ICD-10-CM | POA: Diagnosis not present

## 2023-08-27 DIAGNOSIS — M9903 Segmental and somatic dysfunction of lumbar region: Secondary | ICD-10-CM | POA: Diagnosis not present

## 2023-08-27 DIAGNOSIS — M5136 Other intervertebral disc degeneration, lumbar region with discogenic back pain only: Secondary | ICD-10-CM | POA: Diagnosis not present

## 2023-08-27 DIAGNOSIS — M47816 Spondylosis without myelopathy or radiculopathy, lumbar region: Secondary | ICD-10-CM | POA: Diagnosis not present

## 2023-08-29 DIAGNOSIS — M9904 Segmental and somatic dysfunction of sacral region: Secondary | ICD-10-CM | POA: Diagnosis not present

## 2023-08-29 DIAGNOSIS — M47816 Spondylosis without myelopathy or radiculopathy, lumbar region: Secondary | ICD-10-CM | POA: Diagnosis not present

## 2023-08-29 DIAGNOSIS — M9903 Segmental and somatic dysfunction of lumbar region: Secondary | ICD-10-CM | POA: Diagnosis not present

## 2023-08-29 DIAGNOSIS — M5136 Other intervertebral disc degeneration, lumbar region with discogenic back pain only: Secondary | ICD-10-CM | POA: Diagnosis not present

## 2023-09-02 DIAGNOSIS — M5136 Other intervertebral disc degeneration, lumbar region with discogenic back pain only: Secondary | ICD-10-CM | POA: Diagnosis not present

## 2023-09-02 DIAGNOSIS — M9903 Segmental and somatic dysfunction of lumbar region: Secondary | ICD-10-CM | POA: Diagnosis not present

## 2023-09-02 DIAGNOSIS — M47816 Spondylosis without myelopathy or radiculopathy, lumbar region: Secondary | ICD-10-CM | POA: Diagnosis not present

## 2023-09-02 DIAGNOSIS — M9904 Segmental and somatic dysfunction of sacral region: Secondary | ICD-10-CM | POA: Diagnosis not present

## 2023-09-23 ENCOUNTER — Telehealth: Payer: Self-pay | Admitting: Orthopaedic Surgery

## 2023-09-23 NOTE — Telephone Encounter (Signed)
 Received vm from patient requesting an authorization to be emailed to her. Emailed [email or web address]

## 2023-10-30 ENCOUNTER — Other Ambulatory Visit (HOSPITAL_COMMUNITY)

## 2023-11-04 ENCOUNTER — Ambulatory Visit: Admitting: Orthopaedic Surgery

## 2023-11-08 ENCOUNTER — Encounter (HOSPITAL_COMMUNITY): Payer: Self-pay

## 2023-11-11 ENCOUNTER — Other Ambulatory Visit (HOSPITAL_COMMUNITY): Payer: Self-pay | Admitting: *Deleted

## 2023-11-11 DIAGNOSIS — I472 Ventricular tachycardia, unspecified: Secondary | ICD-10-CM

## 2023-11-11 NOTE — Progress Notes (Signed)
 Informed Consent   Shared Decision Making/Informed Consent The risks [chest pain, shortness of breath, cardiac arrhythmias, dizziness, blood pressure fluctuations, myocardial infarction, stroke/transient ischemic attack, nausea, vomiting, allergic reaction, radiation exposure, metallic taste sensation and life-threatening complications (estimated to be 1 in 10,000)], benefits (risk stratification, diagnosing coronary artery disease, treatment guidance) and alternatives of a cardiac PET stress test were discussed in detail with Alexis Braun and she agrees to proceed.

## 2023-11-12 ENCOUNTER — Ambulatory Visit (HOSPITAL_COMMUNITY)
Admission: RE | Admit: 2023-11-12 | Discharge: 2023-11-12 | Disposition: A | Discharge status: Home / Self Care | Source: Ambulatory Visit | Attending: Cardiology | Admitting: Cardiology

## 2023-11-12 ENCOUNTER — Ambulatory Visit: Payer: Self-pay | Admitting: Cardiology

## 2023-11-12 DIAGNOSIS — I2510 Atherosclerotic heart disease of native coronary artery without angina pectoris: Secondary | ICD-10-CM | POA: Insufficient documentation

## 2023-11-12 DIAGNOSIS — I4729 Other ventricular tachycardia: Secondary | ICD-10-CM | POA: Insufficient documentation

## 2023-11-12 DIAGNOSIS — M47814 Spondylosis without myelopathy or radiculopathy, thoracic region: Secondary | ICD-10-CM | POA: Insufficient documentation

## 2023-11-12 DIAGNOSIS — M419 Scoliosis, unspecified: Secondary | ICD-10-CM | POA: Insufficient documentation

## 2023-11-12 DIAGNOSIS — I471 Supraventricular tachycardia, unspecified: Secondary | ICD-10-CM | POA: Diagnosis not present

## 2023-11-12 LAB — NM PET CT CARDIAC PERFUSION MULTI W/ABSOLUTE BLOODFLOW
MBFR: 2.58
Nuc Rest EF: 55 %
Nuc Stress EF: 57 %
Rest MBF: 0.8 ml/g/min
Rest Nuclear Isotope Dose: 19.3 mCi
ST Depression (mm): 0 mm
Stress MBF: 2.06 ml/g/min
Stress Nuclear Isotope Dose: 19.3 mCi

## 2023-11-12 MED ORDER — RUBIDIUM RB82 GENERATOR (RUBYFILL)
19.3200 | PACK | Freq: Once | INTRAVENOUS | Status: AC
  Administered 2023-11-12: 19.32 via INTRAVENOUS

## 2023-11-12 MED ORDER — REGADENOSON 0.4 MG/5ML IV SOLN
0.4000 mg | Freq: Once | INTRAVENOUS | Status: AC
Start: 2023-11-12 — End: 2023-11-12
  Administered 2023-11-12: 0.4 mg via INTRAVENOUS

## 2023-11-12 MED ORDER — RUBIDIUM RB82 GENERATOR (RUBYFILL)
19.2800 | PACK | Freq: Once | INTRAVENOUS | Status: AC
  Administered 2023-11-12: 19.28 via INTRAVENOUS

## 2023-11-12 MED ORDER — REGADENOSON 0.4 MG/5ML IV SOLN
INTRAVENOUS | Status: AC
Start: 2023-11-12 — End: 2023-11-12
  Filled 2023-11-12: qty 5

## 2023-11-12 NOTE — Progress Notes (Signed)
 Pt. Tolerated lexi scan well.

## 2023-11-13 ENCOUNTER — Telehealth: Payer: Self-pay

## 2023-11-13 ENCOUNTER — Encounter: Payer: Self-pay | Admitting: Cardiology

## 2023-11-13 NOTE — Telephone Encounter (Signed)
 Attempted to return patient's call. No answer. LVM per DPR asking patient to call our office.

## 2023-11-18 ENCOUNTER — Encounter: Payer: Self-pay | Admitting: Radiology

## 2023-11-27 ENCOUNTER — Other Ambulatory Visit (INDEPENDENT_AMBULATORY_CARE_PROVIDER_SITE_OTHER)

## 2023-11-27 ENCOUNTER — Ambulatory Visit: Admitting: Orthopaedic Surgery

## 2023-11-27 ENCOUNTER — Encounter: Payer: Self-pay | Admitting: Orthopaedic Surgery

## 2023-11-27 DIAGNOSIS — M1611 Unilateral primary osteoarthritis, right hip: Secondary | ICD-10-CM | POA: Insufficient documentation

## 2023-11-27 DIAGNOSIS — M25551 Pain in right hip: Secondary | ICD-10-CM

## 2023-11-27 NOTE — Progress Notes (Signed)
 The patient is a very pleasant and active 73 year old female who comes for evaluation treatment of known end-stage bone-on-bone arthritis of her right hip.  She was seen over a year ago in [place]  and was told she would be in need of any hip replacement at some point.  She is now relocated down here.  This is causing her right knee to hurt and she has had injections and scopes over the years of the knee which is also bone-on-bone but the right hip is what bothers her most.  It is detrimentally affecting her mobility, her quality of life and her actives daily living.  Her pain is daily and she would like to discuss hip replacement surgery for her right hip given the failure of conservative treatment and her continued pain.  I was able to review all of her past medical history and medications within epic.  She is not on blood thinning medication.  She is not a diabetic and very active and healthy.  Her right hip has significant stiffness and pain in the groin with internal and external rotation.  Her left hip moves smoothly and fluidly.  Standing AP pelvis and of the right hip shows bone-on-bone arthritis of the right hip with some flattening of the femoral head, complete loss of the joint space, sclerotic changes and osteophytes.  The left hip joint space is well-maintained.  We had a very long and thorough discussion about hip replacement surgery.  She did ask very appropriate questions as relates to activity and even swimming after surgery.  We talked about the risks and benefits of surgery and what to expect from an intraoperative and postoperative standpoint.  I went over x-rays with her as well as a hip replacement model and gave her handout about hip replacement surgery.  She would like us  to go ahead and proceed in the near future with a right hip replacement and I agree with this as well based on her x-ray findings, clinical exam findings and the failure of conservative treatment.  All questions and  concerns were addressed and answered.  Will be in touch with scheduling her for a right total hip arthroplasty.

## 2023-12-01 DIAGNOSIS — T3361XA Superficial frostbite of right hip and thigh, initial encounter: Secondary | ICD-10-CM | POA: Diagnosis not present

## 2023-12-05 ENCOUNTER — Telehealth: Payer: Self-pay

## 2023-12-05 NOTE — Telephone Encounter (Signed)
 [facility] message with stress test interpretation seen by patient 11/28/23.

## 2023-12-10 ENCOUNTER — Other Ambulatory Visit: Payer: Self-pay

## 2023-12-10 ENCOUNTER — Ambulatory Visit: Admitting: Sports Medicine

## 2023-12-10 ENCOUNTER — Other Ambulatory Visit (INDEPENDENT_AMBULATORY_CARE_PROVIDER_SITE_OTHER): Payer: Self-pay

## 2023-12-10 ENCOUNTER — Encounter: Payer: Self-pay | Admitting: Sports Medicine

## 2023-12-10 DIAGNOSIS — M7541 Impingement syndrome of right shoulder: Secondary | ICD-10-CM | POA: Diagnosis not present

## 2023-12-10 DIAGNOSIS — M25511 Pain in right shoulder: Secondary | ICD-10-CM | POA: Diagnosis not present

## 2023-12-10 DIAGNOSIS — M1711 Unilateral primary osteoarthritis, right knee: Secondary | ICD-10-CM | POA: Diagnosis not present

## 2023-12-10 DIAGNOSIS — M62830 Muscle spasm of back: Secondary | ICD-10-CM

## 2023-12-10 DIAGNOSIS — R2689 Other abnormalities of gait and mobility: Secondary | ICD-10-CM

## 2023-12-10 DIAGNOSIS — G8929 Other chronic pain: Secondary | ICD-10-CM

## 2023-12-10 DIAGNOSIS — M1611 Unilateral primary osteoarthritis, right hip: Secondary | ICD-10-CM

## 2023-12-10 NOTE — Progress Notes (Signed)
 Patient says that she has had right shoulder, right hip, and right knee pain for years. She has hip surgery scheduled with Dr. Vernetta in December, and says that she has been a candidate for knee replacement for 3 years. She says that she has connective tissue pain, and would like to talk about possible treatment options for that. She is very active and would like to continue to do the activities that she currently does. She takes OTC medications and uses ice, which do give her some relief. She has not done much targeted physical therapy in the past.

## 2023-12-10 NOTE — Progress Notes (Signed)
 Alexis Braun - 73 y.o. female MRN [id number]  Date of birth: 1950-12-21  Office Visit Note: Visit Date: 12/10/2023 PCP: Stephane Leita DEL, MD Referred by: Stephane Leita DEL, MD  Subjective: Chief Complaint  Patient presents with   Right Shoulder - Pain   Right Knee - Pain   HPI: Alexis Braun is a pleasant 73 y.o. female who presents today for evaluation of right shoulder and right knee pain.  Right knee -she has had knee pain for years.  Worse over the medial side of the knee joint.  She has had both corticosteroid and gel injections in the past which has helped.  She is very active with cycling, yoga as well as walking.  Has hiked in the past.  She does feel the knee will shift at times over the medial joint line with specific yoga positions such as warrior 2.  She recently has been using a combination acetaminophen -Motrin twice daily which does help control her pain.  Right shoulder -she feels pain over the anterior lateral aspect of the shoulder.  No specific swelling.  She does swim often, feels this more so with butterfly stroke.  No clicking catching or radiating pain down the arm.  Right hip - has severe OA. She is scheduled for right hip total arthroplasty surgery on 01/03/2024 with Dr. Vernetta.  *Outside orthopedic notes and surgical op notes reviewed today, [facility] (Dr. Liam):  - She is status post right knee medial and lateral meniscectomy on 03/31/2014. - Has had corticosteroid injection in the past - Last year she underwent 3 subsequent viscosupplementation injections, this did help her  Pertinent ROS were reviewed with the patient and found to be negative unless otherwise specified above in HPI.   Assessment & Plan: Visit Diagnoses:  1. Unilateral primary osteoarthritis, right knee   2. Chronic right shoulder pain   3. Impingement syndrome of right shoulder   4. Spasm of right trapezius muscle   5. Unilateral primary osteoarthritis, right hip   6.  Functional gait abnormality    Plan: Impression is acute on chronic right knee as well as right hip pain.  She has severe osteoarthritis of the right hip which is causing a functional gait abnormality with the leg in an externally rotated position on gait analysis.  I do think this is putting increased pressure on the right knee which has moderate osteoarthritic change over the medial tibiofemoral compartment.  I am curious to see how much her knee pain is improved after she undergoes total hip arthroplasty in December.  In terms of her knee arthritis, she has done well with viscosupplementation in the past, the last was previous year where she underwent a 3 series injection.  We would like to avoid repetitive corticosteroid burden, given this we will authorize for viscosupplementation for the right knee.  In terms of her right shoulder, she has only minimal arthritic change, her symptomatology and x-ray findings are indicative of impingement syndrome with likely a degree of supraspinatus tendinopathy, although no weakness concerning for high-grade tear at this time.  I would like to get her started in formalized physical therapy for the shoulder, I do also think she would benefit from soft tissue treatments for the trapezius and the surrounding shoulder and right quad/IT band structures including dry needling, cupping, TENS unit, excetra.  We could always consider referral to Norvel Piedra for this going forward as well.  She will continue her combination acetaminophen -Motrin twice daily as needed.  We  discussed continuing to stay active with appropriate activity modification, recommended continue aquatic therapy/swimming, stationary bike/cycling, elliptical therapy.  We will see her back once her hyaluronic acid injections are approved in our follow-up on both the shoulder and the knee at that time. ______________________________________________________  This patient is diagnosed with osteoarthritis of the  right knee.    Radiographs show evidence of joint space narrowing, osteophytes, subchondral sclerosis and/or subchondral cysts.  This patient has knee pain which interferes with functional and activities of daily living.    This patient has experienced inadequate response, adverse effects and/or intolerance with conservative treatments such as acetaminophen , NSAIDS, topical creams, physical therapy or regular exercise, knee bracing and/or weight loss.   This patient has experienced inadequate response or has a contraindication to intra articular steroid injections for at least 3 months.   This patient is not scheduled to have a total knee replacement within 6 months of starting treatment with viscosupplementation.   Follow-up: Return for Will call once approved for visco injs for this and f/u shoulder/knee.   Meds & Orders: No orders of the defined types were placed in this encounter.   Orders Placed This Encounter  Procedures   XR Shoulder Right   XR Knee Complete 4 Views Right   Ambulatory referral to Physical Therapy   Ambulatory request for injection medication     Procedures: No procedures performed      Clinical History: No specialty comments available.  She reports that she has never smoked. She has never used smokeless tobacco. No results for input(s): HGBA1C, LABURIC in the last 8760 hours.  Objective:    Physical Exam  Gen: Well-appearing, in no acute distress; non-toxic CV: Well-perfused. Warm.  Resp: Breathing unlabored on room air; no wheezing. Psych: Fluid speech in conversation; appropriate affect; normal thought process  Ortho Exam - Right knee: + TTP over the medial joint line.  No redness swelling or effusion.  Range of motion from 0-125 degrees compared to 0-135 degrees of the contralateral knee.  There is no clicking with McMurray's testing.  - Right shoulder: No redness swelling or effusion.  There is generalized tenderness around Codman's point.   There is full active and passive range of motion.  Mild discomfort with empty can testing.  There is a degree of mild weakness with resisted abduction/external rotation but preserved internal rotation.  There is no significant weakness with rotator cuff testing however.  Positive Hawkins impingement testing.  - Gait analysis: Patient does ambulate with a diminished right hip swing compared to the left side.  She does have a degree of external rotation from the femur down which is likely emanating from her hip arthritic change.  The left leg has a neutral alignment.  Imaging: XR Knee Complete 4 Views Right Result Date: 12/10/2023 4 views of the right knee including standing AP, Rosenberg, lateral and sunrise views was ordered and reviewed by myself today.  There is at moderate+ medial tibiofemoral joint space narrowing with arthritic change as well as mild patellofemoral arthralgia.  Lateral joint line is well-preserved.  No acute fracture noted.  No soft tissue abnormality.  XR Shoulder Right Result Date: 12/10/2023 4 view x-ray of the right shoulder including AP, Grashey, scapular Y and axial view was ordered and reviewed by myself today.  There is only minimal arthritic change within the glenohumeral joint.  There is moderate AC joint narrowing.  There is sclerosis with spurring off the greater tuberosity of the humeral head, likely indicative of impingement.  There is undersurface spurring of the acromion.  No acute fracture noted.   Past Medical/Family/Surgical/Social History: Medications & Allergies reviewed per EMR, new medications updated. Patient Active Problem List   Diagnosis Date Noted   Unilateral primary osteoarthritis, right hip 11/27/2023   SVT (supraventricular tachycardia)    NSVT (nonsustained ventricular tachycardia) (HCC)    CAD (coronary artery disease), native coronary artery 12/2022   Dysphagia 04/21/2020   LLQ abdominal pain 04/15/2019   Gassiness 04/15/2019   Hx of  adenomatous and sessile serrated colonic polyps 12/24/2017   Bladder prolapse, female, acquired    Fecal soiling due to fecal incontinence 06/17/2015   Perianal dermatitis 06/17/2015   Hemorrhoids, internal, with bleeding 06/11/2014   Internal hemorrhoids 05/21/2014   Gallbladder polyp 09/30/2012   Migraine 08/12/2012   GERD (gastroesophageal reflux disease) 01/30/2012   Mixed hyperlipidemia 05/01/2011   Chest pain 05/04/2010   Past Medical History:  Diagnosis Date   Allergy    sulfa   Anxiety    Arthritis    Bladder prolapse, female, acquired    CAD (coronary artery disease), native coronary artery 12/2022   coronary calcium score of 54.9 with 25 to 49% mid LAD stenosis   Chest pain    per pt, over couple years   Congenital hearing loss    right   Gallbladder polyp 2014   GERD (gastroesophageal reflux disease)    Hepatitis    as teen-iv drugs   Hx of adenomatous and sessile serrated colonic polyps 12/24/2017   Hypertension    on medication   Internal hemorrhoid    Migraines    NSVT (nonsustained ventricular tachycardia) (HCC)    noted on ziopatch 06/2023 lasting 13 beats   Ovarian cyst 04/03/1999   PONV (postoperative nausea and vomiting)    Positive hepatitis C antibody test    at age 54; has been told that she self cured No current or past treatment.   Sleep apnea    uses c-pap   SVT (supraventricular tachycardia)    noted on Ziopatch 06/2023 lasting 11 beats   Wears hearing aid    right   Family History  Problem Relation Age of Onset   Heart disease Mother    Dementia Mother    Migraines Mother    Heart disease Father    Heart attack Father    Colon cancer Neg Hx    Breast cancer Neg Hx    Esophageal cancer Neg Hx    Stomach cancer Neg Hx    Rectal cancer Neg Hx    BRCA 1/2 Neg Hx    Past Surgical History:  Procedure Laterality Date   CHOLECYSTECTOMY N/A 10/08/2012   Procedure: LAPAROSCOPIC CHOLECYSTECTOMY WITH INTRAOPERATIVE CHOLANGIOGRAM;   Surgeon: Donnice POUR. Belinda, MD;  Location: [facility];  Service: General;  Laterality: N/A;   CHONDROPLASTY Right 03/31/2014   Procedure: CHONDROPLASTY;  Surgeon: Dempsey Sensor, MD;  Location: [facility];  Service: Orthopedics;  Laterality: Right;   COLONOSCOPY     2007   DRUG INDUCED ENDOSCOPY N/A 03/08/2021   Procedure: DRUG INDUCED SLEEP ENDOSCOPY;  Surgeon: Carlie Clark, MD;  Location: [facility];  Service: ENT;  Laterality: N/A;   HEMORRHOID BANDING  2017   KNEE ARTHROSCOPY WITH LATERAL MENISECTOMY Right 03/31/2014   Procedure: KNEE ARTHROSCOPY WITH  PARTIAL LATERAL MENISECTOMY;  Surgeon: Dempsey Sensor, MD;  Location: [facility];  Service: Orthopedics;  Laterality: Right;   KNEE ARTHROSCOPY WITH MEDIAL MENISECTOMY Right 03/31/2014  Procedure: KNEE ARTHROSCOPY WITH PARTIALMEDIAL MENISECTOMY;  Surgeon: Dempsey Sensor, MD;  Location: [facility];  Service: Orthopedics;  Laterality: Right;   OOPHORECTOMY  2002   Right    scelero     SIGMOIDOSCOPY     1999   Social History   Occupational History   Occupation: Retired  Tobacco Use   Smoking status: Never   Smokeless tobacco: Never  Vaping Use   Vaping status: Never Used  Substance and Sexual Activity   Alcohol  use: Yes    Comment: rarely   Drug use: No   Sexual activity: Yes    Partners: Male    Birth control/protection: Post-menopausal    Comment: younger than 39, more than 5   I spent 44 minutes in the care of the patient today including face-to-face time, preparation to see the patient, as well as review and interpretation of imaging, previous review of surgical intervention and outside orthopedic notes, pharmacologic medication options, discussion regarding activity modification and need for specific physical therapy and other soft tissue treatment modalities for the above diagnoses.   Lonell Sprang, DO Primary Care Sports Medicine Physician  [facility] -  Orthopedics  This note was dictated using Dragon naturally speaking software and may contain errors in syntax, spelling, or content which have not been identified prior to signing this note.

## 2023-12-11 ENCOUNTER — Other Ambulatory Visit: Payer: Self-pay | Admitting: Physician Assistant

## 2023-12-11 DIAGNOSIS — Z01818 Encounter for other preprocedural examination: Secondary | ICD-10-CM

## 2023-12-24 NOTE — Therapy (Signed)
 OUTPATIENT PHYSICAL THERAPY UPPER EXTREMITY EVALUATION   Patient Name: Alexis Braun MRN: [id number] DOB:1950-04-22, 73 y.o., female Today's Date: 12/24/2023  END OF SESSION:   Past Medical History:  Diagnosis Date   Allergy    sulfa   Anxiety    Arthritis    Bladder prolapse, female, acquired    CAD (coronary artery disease), native coronary artery 12/2022   coronary calcium score of 54.9 with 25 to 49% mid LAD stenosis   Chest pain    per pt, over couple years   Congenital hearing loss    right   Gallbladder polyp 2014   GERD (gastroesophageal reflux disease)    Hepatitis    as teen-iv drugs   Hx of adenomatous and sessile serrated colonic polyps 12/24/2017   Hypertension    on medication   Internal hemorrhoid    Migraines    NSVT (nonsustained ventricular tachycardia) (HCC)    noted on ziopatch 06/2023 lasting 13 beats   Ovarian cyst 04/03/1999   PONV (postoperative nausea and vomiting)    Positive hepatitis C antibody test    at age 55; has been told that she self cured No current or past treatment.   Sleep apnea    uses c-pap   SVT (supraventricular tachycardia)    noted on Ziopatch 06/2023 lasting 11 beats   Wears hearing aid    right   Past Surgical History:  Procedure Laterality Date   CHOLECYSTECTOMY N/A 10/08/2012   Procedure: LAPAROSCOPIC CHOLECYSTECTOMY WITH INTRAOPERATIVE CHOLANGIOGRAM;  Surgeon: Donnice POUR. Belinda, MD;  Location: [facility];  Service: General;  Laterality: N/A;   CHONDROPLASTY Right 03/31/2014   Procedure: CHONDROPLASTY;  Surgeon: Dempsey Sensor, MD;  Location: [facility];  Service: Orthopedics;  Laterality: Right;   COLONOSCOPY     2007   DRUG INDUCED ENDOSCOPY N/A 03/08/2021   Procedure: DRUG INDUCED SLEEP ENDOSCOPY;  Surgeon: Carlie Clark, MD;  Location: [facility];  Service: ENT;  Laterality: N/A;   HEMORRHOID BANDING  2017   KNEE ARTHROSCOPY WITH LATERAL MENISECTOMY Right 03/31/2014   Procedure: KNEE  ARTHROSCOPY WITH  PARTIAL LATERAL MENISECTOMY;  Surgeon: Dempsey Sensor, MD;  Location: [facility];  Service: Orthopedics;  Laterality: Right;   KNEE ARTHROSCOPY WITH MEDIAL MENISECTOMY Right 03/31/2014   Procedure: KNEE ARTHROSCOPY WITH PARTIALMEDIAL MENISECTOMY;  Surgeon: Dempsey Sensor, MD;  Location:  [facility];  Service: Orthopedics;  Laterality: Right;   OOPHORECTOMY  2002   Right    scelero     SIGMOIDOSCOPY     1999   Patient Active Problem List   Diagnosis Date Noted   Unilateral primary osteoarthritis, right hip 11/27/2023   SVT (supraventricular tachycardia)    NSVT (nonsustained ventricular tachycardia) (HCC)    CAD (coronary artery disease), native coronary artery 12/2022   Dysphagia 04/21/2020   LLQ abdominal pain 04/15/2019   Gassiness 04/15/2019   Hx of adenomatous and sessile serrated colonic polyps 12/24/2017   Bladder prolapse, female, acquired    Fecal soiling due to fecal incontinence 06/17/2015   Perianal dermatitis 06/17/2015   Hemorrhoids, internal, with bleeding 06/11/2014   Internal hemorrhoids 05/21/2014   Gallbladder polyp 09/30/2012   Migraine 08/12/2012   GERD (gastroesophageal reflux disease) 01/30/2012   Mixed hyperlipidemia 05/01/2011   Chest pain 05/04/2010    PCP: Stephane Leita DEL, MD   REFERRING PROVIDER: Burnetta Brunet, DO   REFERRING DIAG:  Diagnosis  M25.511,G89.29 (ICD-10-CM) - Chronic right shoulder pain  M17.11 (ICD-10-CM) -  Unilateral primary osteoarthritis, right knee  M75.41 (ICD-10-CM) - Impingement syndrome of right shoulder  M62.830 (ICD-10-CM) - Spasm of right trapezius muscle    THERAPY DIAG:  No diagnosis found.  Rationale for Evaluation and Treatment: Rehabilitation  ONSET DATE: ***  SUBJECTIVE:                                                                                                                                                                                      SUBJECTIVE  STATEMENT: *** Hand dominance: {MISC; OT HAND [phone]}  PERTINENT HISTORY: ***  PAIN:  Are you having pain? Yes: NPRS scale: *** Pain location: *** Pain description: *** Aggravating factors: *** Relieving factors: ***  PRECAUTIONS: Anterior hip surgery 01/03/24  RED FLAGS: {PT Red Flags:29287}   WEIGHT BEARING RESTRICTIONS: {Yes ***/No:24003}  FALLS:  Has patient fallen in last 6 months? {fallsyesno:27318}  LIVING ENVIRONMENT: Lives with: {OPRC lives with:25569::lives with their family} Lives in: {Lives in:25570} Stairs: {opstairs:27293} Has following equipment at home: {Assistive devices:23999}  OCCUPATION: ***  PLOF: {PLOF:24004}  PATIENT GOALS: ***  NEXT MD VISIT: ***  OBJECTIVE:  Note: Objective measures were completed at Evaluation unless otherwise noted.  DIAGNOSTIC FINDINGS:  12/10/23 4 view x-ray of the right shoulder including AP, Grashey, scapular Y and  axial view was ordered and reviewed by myself today.  There is only  minimal arthritic change within the glenohumeral joint.  There is moderate  AC joint narrowing.  There is sclerosis with spurring off the greater  tuberosity of the humeral head, likely indicative of impingement.  There  is undersurface spurring of the acromion.  No acute fracture noted.   12/10/23 4 views of the right knee including standing AP, Rosenberg, lateral and  sunrise views was ordered and reviewed by myself today.  There is at  moderate+ medial tibiofemoral joint space narrowing with arthritic change  as well as mild patellofemoral arthralgia.  Lateral joint line is  well-preserved.  No acute fracture noted.  No soft tissue abnormality.   PATIENT SURVEYS :  PSFS: THE PATIENT SPECIFIC FUNCTIONAL SCALE  Place score of 0-10 (0 = unable to perform activity and 10 = able to perform activity at the same level as before injury or problem)  Activity Date: 12/24/23         2.     3.     4.      Total Score  ***      Total Score = Sum of activity scores/number of activities  Minimally Detectable Change: 3 points (for single activity); 2 points (for average score)  [facility] (nd). The Patient Specific Functional Scale .  Retrieved from [email or web address]   COGNITION: Overall cognitive status: Within functional limits for tasks assessed     SENSATION: {sensation:27233}  POSTURE: ***  UPPER EXTREMITY ROM:   Active/Passive ROM Right eval Left eval  Shoulder flexion ***   Shoulder extension    Shoulder abduction    Shoulder adduction    Shoulder internal rotation    Shoulder external rotation    Elbow flexion    Elbow extension    Wrist flexion    Wrist extension    Wrist ulnar deviation    Wrist radial deviation    Wrist pronation    Wrist supination    (Blank rows = not tested)  UPPER EXTREMITY MMT:  MMT Right eval Left eval  Shoulder flexion ***   Shoulder extension    Shoulder abduction    Shoulder adduction    Shoulder internal rotation    Shoulder external rotation    Middle trapezius    Lower trapezius    Elbow flexion    Elbow extension    Wrist flexion    Wrist extension    Wrist ulnar deviation    Wrist radial deviation    Wrist pronation    Wrist supination    Grip strength (lbs)    (Blank rows = not tested)  SHOULDER SPECIAL TESTS: Impingement tests: {shoulder impingement test:25231:a} SLAP lesions: {SLAP lesions:25232} Instability tests: {shoulder instability test:25233} Rotator cuff assessment: {rotator cuff assessment:25234} Biceps assessment: {biceps assessment:25235} LOWER EXTREMITY ROM:     Active/Passive Right eval Left eval  Hip flexion    Hip extension    Hip abduction    Hip adduction    Hip internal rotation    Hip external rotation    Knee flexion ***   Knee extension ***   Ankle dorsiflexion    Ankle plantarflexion    Ankle inversion    Ankle eversion      (Blank rows = not tested)   LOWER EXTREMITY MMT:    MMT Right eval Left eval  Hip flexion    Hip extension    Hip abduction    Hip adduction    Hip internal rotation    Hip external rotation    Knee flexion ***   Knee extension ***   Ankle dorsiflexion    Ankle plantarflexion    Ankle inversion    Ankle eversion     (Blank rows = not tested)  JOINT MOBILITY TESTING:  ***  PALPATION:  ***                                                                                                                             TREATMENT DATE:  12/25/23 Initial evaluation of R shoulder and R knee completed followed by instruction and trial set of HEP.     PATIENT EDUCATION: Education details: HEP Person educated: Patient Education method: Programmer, Multimedia, Facilities Manager, Actor cues, Verbal cues, and Handouts Education comprehension: verbalized understanding, returned demonstration, and verbal cues  required  HOME EXERCISE PROGRAM: ***  ASSESSMENT:  CLINICAL IMPRESSION: Patient is a 73 y.o. female who was seen today for physical therapy evaluation and treatment for R shoulder and R knee.  She presents with ***  Patient would benefit from skilled PT services to address the above deficits and return to previous LOA.  OBJECTIVE IMPAIRMENTS: {opptimpairments:25111}.   ACTIVITY LIMITATIONS: {activitylimitations:27494}  PARTICIPATION LIMITATIONS: {participationrestrictions:25113}  PERSONAL FACTORS: {Personal factors:25162} are also affecting patient's functional outcome.   REHAB POTENTIAL: Good  CLINICAL DECISION MAKING: {clinical decision making:25114}  EVALUATION COMPLEXITY: {Evaluation complexity:25115}  GOALS: Goals reviewed with patient? Yes  SHORT TERM GOALS: Target date: ***  Patient to be independent with HEP. Baseline: Goal status: INITIAL  2.  Decrease pain by 1 level. Baseline:  Goal status: INITIAL  3.  *** Baseline:  Goal status: {GOALSTATUS:25110}  4.   *** Baseline:  Goal status: {GOALSTATUS:25110}  5.  *** Baseline:  Goal status: {GOALSTATUS:25110}  6.  *** Baseline:  Goal status: {GOALSTATUS:25110}  LONG TERM GOALS: Target date: ***  Patient to be independent with self progressive HEP for shoulder and knee at discharge. Baseline:  Goal status: Initial  2.  Increase shoulder active range of motion by**** Baseline:  Goal status: Initial  3.  Increase knee active range of motion by*** Baseline:  Goal status: Initial  4.  Increase strength of right upper extremity by half a grade to 1 full grade where needed. Baseline:  Goal status: Initial  5.  Increase strength of right knee musculature by half a grade to 1 full grade where needed. Baseline:  Goal status: Initial  6.  Decrease max pain to 2 out of 10 in right upper extremity and right knee. Baseline:  Goal status: Initial  PLAN: PT FREQUENCY: {rehab frequency:25116}  PT DURATION: {rehab duration:25117}  PLANNED INTERVENTIONS: 97164- PT Re-evaluation, 97110-Therapeutic exercises, 97530- Therapeutic activity, 97112- Neuromuscular re-education, 97535- Self Care, 02859- Manual therapy, [id number]- Aquatic Therapy, H9716- Electrical stimulation (unattended), 20560 (1-2 muscles), 20561 (3+ muscles)- Dry Needling, Patient/Family education, Taping, Joint mobilization, Cryotherapy, and Moist heat  PLAN FOR NEXT SESSION: ***   Burnard CHRISTELLA Meth, PT 12/24/2023, 4:00 PM

## 2023-12-25 ENCOUNTER — Ambulatory Visit

## 2023-12-25 ENCOUNTER — Other Ambulatory Visit: Payer: Self-pay

## 2023-12-25 DIAGNOSIS — M25611 Stiffness of right shoulder, not elsewhere classified: Secondary | ICD-10-CM | POA: Diagnosis not present

## 2023-12-25 DIAGNOSIS — G8929 Other chronic pain: Secondary | ICD-10-CM | POA: Diagnosis not present

## 2023-12-25 DIAGNOSIS — M25661 Stiffness of right knee, not elsewhere classified: Secondary | ICD-10-CM

## 2023-12-25 DIAGNOSIS — M25511 Pain in right shoulder: Secondary | ICD-10-CM

## 2023-12-25 DIAGNOSIS — M25561 Pain in right knee: Secondary | ICD-10-CM | POA: Diagnosis not present

## 2023-12-25 NOTE — Progress Notes (Addendum)
 Date of COVID positive in last 90 days:  PCP - Leita Blind, MD Cardiologist - Wilbert Bihari, MD  last video visit 06/14/23  Chest x-ray - N/A EKG - 12/26/23 Epic/chart Stress Test - 05/08/16 Epic ECHO - 05/16/16 Epic Cardiac Cath - N/A Pacemaker/ICD device last checked:N/A Spinal Cord Stimulator:N/A  Bowel Prep - N/A  Sleep Study - yes CPAP -  yes   Fasting Blood Sugar - N/A Checks Blood Sugar _____ times a day  Last dose of GLP1 agonist-  N/A GLP1 instructions:  Do not take after     Last dose of SGLT-2 inhibitors-  N/A SGLT-2 instructions:  Do not take after     Blood Thinner Instructions: N/A Last dose:   Time: Aspirin Instructions: ASA 81, hold 7 days Last Dose:  Activity level: Can go up a flight of stairs and perform activities of daily living without stopping and without symptoms of chest pain or shortness of breath.   Anesthesia review: CAD, SVT, OSA, HTN, bradycardia at 50 on EKG  Patient denies shortness of breath, fever, cough and chest pain at PAT appointment  Patient verbalized understanding of instructions that were given to them at the PAT appointment. Patient was also instructed that they will need to review over the PAT instructions again at home before surgery.

## 2023-12-25 NOTE — Patient Instructions (Addendum)
 SURGICAL WAITING ROOM VISITATION  Patients having surgery or a procedure may have no more than 2 support people in the waiting area - these visitors may rotate.    Children ages 54 and under will not be able to visit patients in [facility] under most circumstances.   Visitors with respiratory illnesses are discouraged from visiting and should remain at home.  If the patient needs to stay at the hospital during part of their recovery, the visitor guidelines for inpatient rooms apply. Pre-op nurse will coordinate an appropriate time for 1 support person to accompany patient in pre-op.  This support person may not rotate.    Please refer to the [facility] website for the visitor guidelines for Inpatients (after your surgery is over and you are in a regular room).    Your procedure is scheduled on: 01/03/24   Report to [facility] Main Entrance    Report to admitting at 5:15 AM   Call this number if you have problems the morning of surgery [phone]   Do not eat food :After Midnight.   After Midnight you may have the following liquids until 4:15 AM DAY OF SURGERY  Water Non-Citrus Juices (without pulp, NO RED-Apple, White grape, White cranberry) Black Coffee (NO MILK/CREAM OR CREAMERS, sugar ok)  Clear Tea (NO MILK/CREAM OR CREAMERS, sugar ok) regular and decaf                             Plain Jell-O (NO RED)                                           Fruit ices (not with fruit pulp, NO RED)                                     Popsicles (NO RED)                                                               Sports drinks like Gatorade (NO RED)                 The day of surgery:  Drink ONE (1) Pre-Surgery Clear Ensure at 4:15 AM the morning of surgery. Drink in one sitting. Do not sip.  This drink was given to you during your hospital  pre-op appointment visit. Nothing else to drink after completing the  Pre-Surgery Clear Ensure.          If you have  questions, please contact your surgeons office.   FOLLOW BOWEL PREP AND ANY ADDITIONAL PRE OP INSTRUCTIONS YOU RECEIVED FROM YOUR SURGEON'S OFFICE!!!     Oral Hygiene is also important to reduce your risk of infection.                                    Remember - BRUSH YOUR TEETH THE MORNING OF SURGERY WITH YOUR REGULAR TOOTHPASTE  DENTURES WILL BE REMOVED PRIOR TO SURGERY PLEASE DO NOT APPLY  Poly grip OR ADHESIVES!!!   Stop all vitamins and herbal supplements 7 days before surgery.   Take these medicines the morning of surgery with A SIP OF WATER: None                              You may not have any metal on your body including hair pins, jewelry, and body piercing             Do not wear make-up, lotions, powders, perfumes, or deodorant  Do not wear nail polish including gel and S&S, artificial/acrylic nails, or any other type of covering on natural nails including finger and toenails. If you have artificial nails, gel coating, etc. that needs to be removed by a nail salon please have this removed prior to surgery or surgery may need to be canceled/ delayed if the surgeon/ anesthesia feels like they are unable to be safely monitored.   Do not shave  48 hours prior to surgery.    Do not bring valuables to the hospital. [facility] IS NOT             RESPONSIBLE   FOR VALUABLES.   Contacts, glasses, dentures or bridgework may not be worn into surgery.   Bring small overnight bag day of surgery.   DO NOT BRING YOUR HOME MEDICATIONS TO THE HOSPITAL. PHARMACY WILL DISPENSE MEDICATIONS LISTED ON YOUR MEDICATION LIST TO YOU DURING YOUR ADMISSION IN THE HOSPITAL!              Please read over the following fact sheets you were given: IF YOU HAVE QUESTIONS ABOUT YOUR PRE-OP INSTRUCTIONS PLEASE CALL [phone].   If you received a COVID test during your pre-op visit  it is requested that you wear a mask when out in public, stay away from anyone that may not be feeling well  and notify your surgeon if you develop symptoms. If you test positive for Covid or have been in contact with anyone that has tested positive in the last 10 days please notify you surgeon.      Pre-operative 4 CHG Bath Instructions  DYNA-Hex 4 Chlorhexidine  Gluconate 4% Solution Antiseptic 4 fl. oz   You can play a key role in reducing the risk of infection after surgery. Your skin needs to be as free of germs as possible. You can reduce the number of germs on your skin by washing with CHG (chlorhexidine  gluconate) soap before surgery. CHG is an antiseptic soap that kills germs and continues to kill germs even after washing.   DO NOT use if you have an allergy to chlorhexidine /CHG or antibacterial soaps. If your skin becomes reddened or irritated, stop using the CHG and notify one of our RNs at   Please shower with the CHG soap starting 4 days before surgery using the following schedule:     Please keep in mind the following:  DO NOT shave, including legs and underarms, starting the day of your first shower.   You may shave your face at any point before/day of surgery.  Place clean sheets on your bed the day you start using CHG soap. Use a clean washcloth (not used since being washed) for each shower. DO NOT sleep with pets once you start using the CHG.  CHG Shower Instructions:  If you choose to wash your hair and private area, wash first with your normal shampoo/soap.  After you use shampoo/soap, rinse your  hair and body thoroughly to remove shampoo/soap residue.  Turn the water OFF and apply about 3 tablespoons (45 ml) of CHG soap to a CLEAN washcloth.  Apply CHG soap ONLY FROM YOUR NECK DOWN TO YOUR TOES (washing for 3-5 minutes)  DO NOT use CHG soap on face, private areas, open wounds, or sores.  Pay special attention to the area where your surgery is being performed.  If you are having back surgery, having someone wash your back for you may be helpful. Wait 2 minutes after CHG soap  is applied, then you may rinse off the CHG soap.  Pat dry with a clean towel  Put on clean clothes/pajamas   If you choose to wear lotion, please use ONLY the CHG-compatible lotions on the back of this paper.     Additional instructions for the day of surgery: DO NOT APPLY any lotions, deodorants, cologne, or perfumes.   Put on clean/comfortable clothes.  Brush your teeth.  Ask your nurse before applying any prescription medications to the skin.   CHG Compatible Lotions   Aveeno Moisturizing lotion  Cetaphil Moisturizing Cream  Cetaphil Moisturizing Lotion  Clairol Herbal Essence Moisturizing Lotion, Dry Skin  Clairol Herbal Essence Moisturizing Lotion, Extra Dry Skin  Clairol Herbal Essence Moisturizing Lotion, Normal Skin  Curel Age Defying Therapeutic Moisturizing Lotion with Alpha Hydroxy  Curel Extreme Care Body Lotion  Curel Soothing Hands Moisturizing Hand Lotion  Curel Therapeutic Moisturizing Cream, Fragrance-Free  Curel Therapeutic Moisturizing Lotion, Fragrance-Free  Curel Therapeutic Moisturizing Lotion, Original Formula  Eucerin Daily Replenishing Lotion  Eucerin Dry Skin Therapy Plus Alpha Hydroxy Crme  Eucerin Dry Skin Therapy Plus Alpha Hydroxy Lotion  Eucerin Original Crme  Eucerin Original Lotion  Eucerin Plus Crme Eucerin Plus Lotion  Eucerin TriLipid Replenishing Lotion  Keri Anti-Bacterial Hand Lotion  Keri Deep Conditioning Original Lotion Dry Skin Formula Softly Scented  Keri Deep Conditioning Original Lotion, Fragrance Free Sensitive Skin Formula  Keri Lotion Fast Absorbing Fragrance Free Sensitive Skin Formula  Keri Lotion Fast Absorbing Softly Scented Dry Skin Formula  Keri Original Lotion  Keri Skin Renewal Lotion Keri Silky Smooth Lotion  Keri Silky Smooth Sensitive Skin Lotion  Nivea Body Creamy Conditioning Oil  Nivea Body Extra Enriched Teacher, Adult Education Moisturizing Lotion Nivea Crme  Nivea Skin  Firming Lotion  NutraDerm 30 Skin Lotion  NutraDerm Skin Lotion  NutraDerm Therapeutic Skin Cream  NutraDerm Therapeutic Skin Lotion  ProShield Protective Hand Cream  Provon moisturizing lotion  View Pre-Surgery Education Videos:  [email or web address]    WHAT IS A BLOOD TRANSFUSION? Blood Transfusion Information  A transfusion is the replacement of blood or some of its parts. Blood is made up of multiple cells which provide different functions. Red blood cells carry oxygen and are used for blood loss replacement. White blood cells fight against infection. Platelets control bleeding. Plasma helps clot blood. Other blood products are available for specialized needs, such as hemophilia or other clotting disorders. BEFORE THE TRANSFUSION  Who gives blood for transfusions?  Healthy volunteers who are fully evaluated to make sure their blood is safe. This is blood bank blood. Transfusion therapy is the safest it has ever been in the practice of medicine. Before blood is taken from a donor, a complete history is taken to make sure that person has no history of diseases nor engages in risky social behavior (examples are intravenous drug use or sexual activity with multiple partners). The donor's travel history is  screened to minimize risk of transmitting infections, such as malaria. The donated blood is tested for signs of infectious diseases, such as HIV and hepatitis. The blood is then tested to be sure it is compatible with you in order to minimize the chance of a transfusion reaction. If you or a relative donates blood, this is often done in anticipation of surgery and is not appropriate for emergency situations. It takes many days to process the donated blood. RISKS AND COMPLICATIONS Although transfusion therapy is very safe and saves many lives, the main dangers of transfusion include:  Getting an infectious disease. Developing a  transfusion reaction. This is an allergic reaction to something in the blood you were given. Every precaution is taken to prevent this. The decision to have a blood transfusion has been considered carefully by your caregiver before blood is given. Blood is not given unless the benefits outweigh the risks. AFTER THE TRANSFUSION Right after receiving a blood transfusion, you will usually feel much better and more energetic. This is especially true if your red blood cells have gotten low (anemic). The transfusion raises the level of the red blood cells which carry oxygen, and this usually causes an energy increase. The nurse administering the transfusion will monitor you carefully for complications. HOME CARE INSTRUCTIONS  No special instructions are needed after a transfusion. You may find your energy is better. Speak with your caregiver about any limitations on activity for underlying diseases you may have. SEEK MEDICAL CARE IF:  Your condition is not improving after your transfusion. You develop redness or irritation at the intravenous (IV) site. SEEK IMMEDIATE MEDICAL CARE IF:  Any of the following symptoms occur over the next 12 hours: Shaking chills. You have a temperature by mouth above 102 F (38.9 C), not controlled by medicine. Chest, back, or muscle pain. People around you feel you are not acting correctly or are confused. Shortness of breath or difficulty breathing. Dizziness and fainting. You get a rash or develop hives. You have a decrease in urine output. Your urine turns a dark color or changes to pink, red, or brown. Any of the following symptoms occur over the next 10 days: You have a temperature by mouth above 102 F (38.9 C), not controlled by medicine. Shortness of breath. Weakness after normal activity. The white part of the eye turns yellow (jaundice). You have a decrease in the amount of urine or are urinating less often. Your urine turns a dark color or changes to  pink, red, or brown. Document Released: 12/30/1999 Document Revised: 03/26/2011 Document Reviewed: 08/18/2007 [facility] Patient Information 2014 [place], [place].  _______________________________________________________________________

## 2023-12-26 ENCOUNTER — Encounter (HOSPITAL_COMMUNITY): Payer: Self-pay

## 2023-12-26 ENCOUNTER — Encounter (HOSPITAL_COMMUNITY)
Admission: RE | Admit: 2023-12-26 | Discharge: 2023-12-26 | Disposition: A | Source: Ambulatory Visit | Attending: Orthopaedic Surgery

## 2023-12-26 VITALS — BP 132/74 | HR 51 | Temp 98.7°F | Resp 16 | Ht 64.5 in | Wt 170.0 lb

## 2023-12-26 DIAGNOSIS — I10 Essential (primary) hypertension: Secondary | ICD-10-CM

## 2023-12-26 DIAGNOSIS — Z01818 Encounter for other preprocedural examination: Secondary | ICD-10-CM

## 2023-12-26 HISTORY — DX: Hypoglycemia, unspecified: E16.2

## 2023-12-26 LAB — BASIC METABOLIC PANEL WITH GFR
Anion gap: 9 (ref 5–15)
BUN: 26 mg/dL — ABNORMAL HIGH (ref 8–23)
CO2: 26 mmol/L (ref 22–32)
Calcium: 9.7 mg/dL (ref 8.9–10.3)
Chloride: 102 mmol/L (ref 98–111)
Creatinine, Ser: 0.73 mg/dL (ref 0.44–1.00)
GFR, Estimated: >60 mL/min (ref >60)
Glucose, Bld: 106 mg/dL — ABNORMAL HIGH (ref 70–99)
Potassium: 4 mmol/L (ref 3.5–5.1)
Sodium: 137 mmol/L (ref 135–145)

## 2023-12-26 LAB — CBC
HCT: 36.4 % (ref 36.0–46.0)
Hemoglobin: 12.3 g/dL (ref 12.0–15.0)
MCH: 31.4 pg (ref 26.0–34.0)
MCHC: 33.8 g/dL (ref 30.0–36.0)
MCV: 92.9 fL (ref 80.0–100.0)
Platelets: 259 K/uL (ref 150–400)
RBC: 3.92 MIL/uL (ref 3.87–5.11)
RDW: 12 % (ref 11.5–15.5)
WBC: 6.6 K/uL (ref 4.0–10.5)
nRBC: 0 % (ref 0.0–0.2)

## 2023-12-26 LAB — SURGICAL PCR SCREEN
MRSA, PCR: NEGATIVE
Staphylococcus aureus: NEGATIVE

## 2023-12-27 ENCOUNTER — Encounter (HOSPITAL_COMMUNITY): Payer: Self-pay

## 2023-12-27 NOTE — Progress Notes (Signed)
 Case: [id number] Date/Time: 01/03/24 0700   Procedure: ARTHROPLASTY, HIP, TOTAL, ANTERIOR APPROACH (Right: Hip)   Anesthesia type: Spinal   Diagnosis: Primary osteoarthritis of right hip [M16.11]   Pre-op diagnosis: Osteoarthritis Right Hip   Location: [facility] ROOM 09 / [facility]   Surgeons: Vernetta Lonni GRADE, MD       DISCUSSION: Alexis Braun is a 73 yo female with PMH of CAD (by imaging), NSVT, SVT, OSA (uses CPAP), migraines, hx of Hep C, GERD, arthritis, PONV, anxiety, arthritis  Patient with hx of CAD by imaging and is followed by Cardiology. Patient was seen on 06/14/23 for palpitatoins. Event monitoring was ordered which showed episode of NSVT and SVT. A Cardiac PET scan was ordered which was done on 11/12/23 and showed possible area of ischemia with no evidence of infarction. Per Dr. Shlomo:  Low risk stress test with likely small artifact in the inferior septum although could not also rule out a very small area of ischemia but low risk because less than 10% of total myocardium.  Felt to be artifact since wall motion in this area was normal.  EF normal at 55% resting.  Normal myocardial blood flow reserve.  Continue statin therapy, atenolol  and aspirin  Hx of Hep C but has not been treated. Hep C RNA by PCR is nondetectable per GI notes.  At PAT visit patient reports she can do stairs w/o CP/SOB. Anticipate she can proceed.  VS: BP 132/74   Pulse (!) 51   Temp 37.1 C (Oral)   Resp 16   Ht 5' 4.5 (1.638 m)   Wt 77.1 kg   SpO2 98%   BMI 28.73 kg/m   PROVIDERS: Stephane Leita DEL, MD   LABS: Labs reviewed: Acceptable for surgery. (all labs ordered are listed, but only abnormal results are displayed)  Labs Reviewed  BASIC METABOLIC PANEL WITH GFR - Abnormal; Notable for the following components:      Result Value   Glucose, Bld 106 (*)    BUN 26 (*)    All other components within normal limits  SURGICAL PCR SCREEN  CBC  TYPE AND SCREEN     CT chest overread  11/12/23:   IMPRESSION: 1. No significant extracardiac findings. 2. Thoracic spondylosis and mild levoconvex lower thoracic scoliosis.   EKG 12/26/23:  Sinus bradycardia Possible left atrial enlargement  NM PET CT Cardiac 11/12/23:  Narrative & Impression     Findings are consistent with no infarction. Small territory of possible inferoseptal inschemia in the setting of blood pool artifact and normal stress flows.  Artifact is favored, but cannot exlude small territory RCA ischemica. The study is low risk due to normal function, flows, and affected myocardum < 10% of total.   LV perfusion is abnormal. There is no evidence of infarction. Defect 1: There is a small defect with mild reduction in uptake present in the mid inferoseptal location(s) that is reversible. There is normal wall motion in the defect area. Consistent with artifact.   Rest left ventricular function is normal. Rest EF: 55%. Stress left ventricular function is normal. Stress EF: 57%. End diastolic cavity size is normal. End systolic cavity size is normal.   Myocardial blood flow was computed to be 0.71ml/g/min at rest and 2.06ml/g/min at stress. Global myocardial blood flow reserve was 2.58 and was normal.   Coronary calcium was present on the attenuation correction CT images. Mild coronary calcifications were present. Coronary calcifications were present in the left anterior descending artery distribution(s).  Event monitor 06/27/23:    Predominant rhythm was normal sinus rhythm with average heart rate of 65 bpm.  The heart rate ranged from 42 to 116 bpm   1 episode of NSVT lasting 13 beats at 182 bpm   2 episodes of SVT lasting as long as 11 beats with fastest heart rate 171 bpm consistent with atrial tachycardia   Rare PACs, atrial couplets and triplets   Rare PVCs, ventricular couplets and triplets    Coronary CTA 12/26/22:  IMPRESSION: 1. Coronary calcium score of 54.9. This was 64th percentile for age-, sex,  and race-matched controls.   2. Total plaque volume (TPV) 41 mm3 which is 23rd percentile for age- and sex-matched controls (calcified plaque 16 mm3; non-calcified plaque 25 mm3 (low attenuation plaque 1 mm3)). TPV is mild.   3.  Normal coronary origin with left dominance.   4.  CAD-RADS = 2 mild non-obstructive CAD .   Left Main: Patent.   LAD: Mild stenosis (25-49%) due to calcified plaque at the mid LAD otherwise vessel is patent.   LCX: Patent.   RCA: Patent.   RECOMMENDATIONS:   Consider non-atherosclerotic causes of chest pain. Consider preventive therapy and risk factor modification.  Echo 05/16/2016:  Study Conclusions  - Left ventricle: The cavity size was normal. Wall thickness was   normal. Systolic function was normal. The estimated ejection   fraction was in the range of 55% to 60%. Wall motion was normal;   there were no regional wall motion abnormalities. The study is   not technically sufficient to allow evaluation of LV diastolic   function.  Impressions:  - Normal LV systolic function; trace MR.  Past Medical History:  Diagnosis Date   Allergy    sulfa   Anxiety    Arthritis    Bladder prolapse, female, acquired    CAD (coronary artery disease), native coronary artery 12/2022   coronary calcium score of 54.9 with 25 to 49% mid LAD stenosis   Chest pain    per pt, over couple years   Congenital hearing loss    right   Gallbladder polyp 2014   GERD (gastroesophageal reflux disease)    Hepatitis    as teen-iv drugs   Hx of adenomatous and sessile serrated colonic polyps 12/24/2017   Hypertension    on medication   Hypoglycemia    Internal hemorrhoid    Migraines    NSVT (nonsustained ventricular tachycardia) (HCC)    noted on ziopatch 06/2023 lasting 13 beats   Ovarian cyst 04/03/1999   PONV (postoperative nausea and vomiting)    Positive hepatitis C antibody test    at age 38; has been told that she self cured No current or past  treatment.   Sleep apnea    uses c-pap   SVT (supraventricular tachycardia)    noted on Ziopatch 06/2023 lasting 11 beats   Wears hearing aid    right    Past Surgical History:  Procedure Laterality Date   CHOLECYSTECTOMY N/A 10/08/2012   Procedure: LAPAROSCOPIC CHOLECYSTECTOMY WITH INTRAOPERATIVE CHOLANGIOGRAM;  Surgeon: Donnice POUR. Belinda, MD;  Location: [facility];  Service: General;  Laterality: N/A;   CHONDROPLASTY Right 03/31/2014   Procedure: CHONDROPLASTY;  Surgeon: Dempsey Sensor, MD;  Location: [facility];  Service: Orthopedics;  Laterality: Right;   COLONOSCOPY     2007   DRUG INDUCED ENDOSCOPY N/A 03/08/2021   Procedure: DRUG INDUCED SLEEP ENDOSCOPY;  Surgeon: Carlie Clark, MD;  Location: [facility];  Service: ENT;  Laterality: N/A;   HEMORRHOID BANDING  2017   KNEE ARTHROSCOPY WITH LATERAL MENISECTOMY Right 03/31/2014   Procedure: KNEE ARTHROSCOPY WITH  PARTIAL LATERAL MENISECTOMY;  Surgeon: Dempsey Sensor, MD;  Location: [facility];  Service: Orthopedics;  Laterality: Right;   KNEE ARTHROSCOPY WITH MEDIAL MENISECTOMY Right 03/31/2014   Procedure: KNEE ARTHROSCOPY WITH PARTIALMEDIAL MENISECTOMY;  Surgeon: Dempsey Sensor, MD;  Location: [facility];  Service: Orthopedics;  Laterality: Right;   OOPHORECTOMY  2002   Right    scelero     SIGMOIDOSCOPY     1999    MEDICATIONS:  ALPRAZolam (XANAX) 0.5 MG tablet   aspirin EC 81 MG tablet   atenolol  (TENORMIN ) 25 MG tablet   cyclobenzaprine (FLEXERIL) 10 MG tablet   estradiol  (ESTRACE ) 0.1 MG/GM vaginal cream   Eyelid Cleansers (OPTASE TTO CLEANSING WIPES) PADS   famotidine (PEPCID) 20 MG tablet   meloxicam (MOBIC) 15 MG tablet   Omega-3 Fatty Acids (FISH OIL) 1000 MG CAPS   progesterone (PROMETRIUM) 100 MG capsule   psyllium (REGULOID) 0.52 g capsule   REFRESH OPTIVE MEGA-3 0.5-1-0.5 % SOLN   rosuvastatin (CRESTOR) 5 MG tablet   SUMAtriptan (IMITREX) 100 MG tablet    traZODone (DESYREL) 50 MG tablet   TRYPTYR 0.003 % SOLN   valACYclovir  (VALTREX ) 500 MG tablet   No current facility-administered medications for this encounter.   Burnard CHRISTELLA Odis DEVONNA [facility] Short Stay/Anesthesiology [facility] Phone [phone] 12/28/2023 1:11 PM

## 2023-12-28 NOTE — Anesthesia Preprocedure Evaluation (Signed)
 Anesthesia Evaluation    History of Anesthesia Complications (+) PONV and history of anesthetic complications  Airway        Dental   Pulmonary sleep apnea           Cardiovascular hypertension, Pt. on medications and Pt. on home beta blockers + CAD    NM PET CT Cardiac 11/12/23:   Narrative & Impression     Findings are consistent with no infarction. Small territory of possible inferoseptal inschemia in the setting of blood pool artifact and normal stress flows.  Artifact is favored, but cannot exlude small territory RCA ischemica. The study is low risk due to normal function, flows, and affected myocardum < 10% of total.   LV perfusion is abnormal. There is no evidence of infarction. Defect 1: There is a small defect with mild reduction in uptake present in the mid inferoseptal location(s) that is reversible. There is normal wall motion in the defect area. Consistent with artifact.   Rest left ventricular function is normal. Rest EF: 55%. Stress left ventricular function is normal. Stress EF: 57%. End diastolic cavity size is normal. End systolic cavity size is normal.   Myocardial blood flow was computed to be 0.92ml/g/min at rest and 2.06ml/g/min at stress. Global myocardial blood flow reserve was 2.58 and was normal.   Coronary calcium was present on the attenuation correction CT images. Mild coronary calcifications were present. Coronary calcifications were present in the left anterior descending artery distribution(s).    Neuro/Psych  Headaches  Anxiety        GI/Hepatic ,GERD  ,,(+) Hepatitis -, C  Endo/Other  negative endocrine ROS    Renal/GU negative Renal ROS  negative genitourinary   Musculoskeletal   Abdominal   Peds  Hematology   Anesthesia Other Findings   Reproductive/Obstetrics                              Anesthesia Physical Anesthesia Plan  ASA: 3  Anesthesia Plan: MAC  and Spinal   Post-op Pain Management:    Induction: Intravenous  PONV Risk Score and Plan: 3 and Ondansetron , Dexamethasone , Propofol  infusion and Treatment may vary due to age or medical condition  Airway Management Planned: Simple Face Mask and Nasal Cannula  Additional Equipment: None  Intra-op Plan:   Post-operative Plan:   Informed Consent:   Plan Discussed with:   Anesthesia Plan Comments: (See PAT note from 12/11)         Anesthesia Quick Evaluation

## 2023-12-30 ENCOUNTER — Ambulatory Visit

## 2023-12-30 ENCOUNTER — Encounter: Admitting: Rehabilitative and Restorative Service Providers"

## 2023-12-30 DIAGNOSIS — M25611 Stiffness of right shoulder, not elsewhere classified: Secondary | ICD-10-CM

## 2023-12-30 DIAGNOSIS — M25511 Pain in right shoulder: Secondary | ICD-10-CM

## 2023-12-30 DIAGNOSIS — G8929 Other chronic pain: Secondary | ICD-10-CM

## 2023-12-30 DIAGNOSIS — M25661 Stiffness of right knee, not elsewhere classified: Secondary | ICD-10-CM

## 2023-12-30 NOTE — Therapy (Signed)
 OUTPATIENT PHYSICAL THERAPY UPPER/LOWER EXTREMITY TREATMENT   Patient Name: Alexis Braun MRN: [id number] DOB:08/23/1950, 73 y.o., female Today's Date: 12/30/2023  END OF SESSION:  PT End of Session - 12/30/23 1657     Visit Number 2    Number of Visits 17    Date for Recertification  02/20/24    PT Start Time 1400    PT Stop Time 1430    PT Time Calculation (min) 30 min    Activity Tolerance Patient tolerated treatment well    Behavior During Therapy [facility] for tasks assessed/performed           Past Medical History:  Diagnosis Date   Allergy    sulfa   Anxiety    Arthritis    Bladder prolapse, female, acquired    CAD (coronary artery disease), native coronary artery 12/2022   coronary calcium score of 54.9 with 25 to 49% mid LAD stenosis   Chest pain    per pt, over couple years   Congenital hearing loss    right   Gallbladder polyp 2014   GERD (gastroesophageal reflux disease)    Hepatitis    as teen-iv drugs   Hx of adenomatous and sessile serrated colonic polyps 12/24/2017   Hypertension    on medication   Hypoglycemia    Internal hemorrhoid    Migraines    NSVT (nonsustained ventricular tachycardia) (HCC)    noted on ziopatch 06/2023 lasting 13 beats   Ovarian cyst 04/03/1999   PONV (postoperative nausea and vomiting)    Positive hepatitis C antibody test    at age 84; has been told that she self cured No current or past treatment.   Sleep apnea    uses c-pap   SVT (supraventricular tachycardia)    noted on Ziopatch 06/2023 lasting 11 beats   Wears hearing aid    right   Past Surgical History:  Procedure Laterality Date   CHOLECYSTECTOMY N/A 10/08/2012   Procedure: LAPAROSCOPIC CHOLECYSTECTOMY WITH INTRAOPERATIVE CHOLANGIOGRAM;  Surgeon: Donnice POUR. Belinda, MD;  Location: [facility];  Service: General;  Laterality: N/A;   CHONDROPLASTY Right 03/31/2014   Procedure: CHONDROPLASTY;  Surgeon: Dempsey Sensor, MD;  Location: [facility];   Service: Orthopedics;  Laterality: Right;   COLONOSCOPY     2007   DRUG INDUCED ENDOSCOPY N/A 03/08/2021   Procedure: DRUG INDUCED SLEEP ENDOSCOPY;  Surgeon: Carlie Clark, MD;  Location: [facility];  Service: ENT;  Laterality: N/A;   HEMORRHOID BANDING  2017   KNEE ARTHROSCOPY WITH LATERAL MENISECTOMY Right 03/31/2014   Procedure: KNEE ARTHROSCOPY WITH  PARTIAL LATERAL MENISECTOMY;  Surgeon: Dempsey Sensor, MD;  Location: [facility];  Service: Orthopedics;  Laterality: Right;   KNEE ARTHROSCOPY WITH MEDIAL MENISECTOMY Right 03/31/2014   Procedure: KNEE ARTHROSCOPY WITH PARTIALMEDIAL MENISECTOMY;  Surgeon: Dempsey Sensor, MD;  Location: [facility];  Service: Orthopedics;  Laterality: Right;   OOPHORECTOMY  2002   Right    scelero     SIGMOIDOSCOPY     1999   Patient Active Problem List   Diagnosis Date Noted   Unilateral primary osteoarthritis, right hip 11/27/2023   SVT (supraventricular tachycardia)    NSVT (nonsustained ventricular tachycardia) (HCC)    CAD (coronary artery disease), native coronary artery 12/2022   Dysphagia 04/21/2020   LLQ abdominal pain 04/15/2019   Gassiness 04/15/2019   Hx of adenomatous and sessile serrated colonic polyps 12/24/2017   Bladder prolapse, female, acquired  Fecal soiling due to fecal incontinence 06/17/2015   Perianal dermatitis 06/17/2015   Hemorrhoids, internal, with bleeding 06/11/2014   Internal hemorrhoids 05/21/2014   Gallbladder polyp 09/30/2012   Migraine 08/12/2012   GERD (gastroesophageal reflux disease) 01/30/2012   Mixed hyperlipidemia 05/01/2011   Chest pain 05/04/2010    PCP: Stephane Leita DEL, MD   REFERRING PROVIDER: Burnetta Brunet, DO   REFERRING DIAG:  Diagnosis  M25.511,G89.29 (ICD-10-CM) - Chronic right shoulder pain  M17.11 (ICD-10-CM) - Unilateral primary osteoarthritis, right knee  M75.41 (ICD-10-CM) - Impingement syndrome of right shoulder  M62.830 (ICD-10-CM) - Spasm of  right trapezius muscle    THERAPY DIAG:  Chronic right shoulder pain  Stiffness of right shoulder, not elsewhere classified  Chronic pain of right knee  Stiffness of right knee, not elsewhere classified  Acute pain of right shoulder  Rationale for Evaluation and Treatment: Rehabilitation  ONSET DATE: chronic  SUBJECTIVE:                                                                                                                                                                                      SUBJECTIVE STATEMENT: Pt arrives late for session but wanted a short session before having her hip surgery.  States shoulders  feel the same.  Hand dominance: Right Rt knee with meniscus surgery 5 years ago.  Pain is at the medial R knee.  Pain increases with squatting and prolonged standing.   PERTINENT HISTORY: Pt is scheduled for R THA in 2 weeks   PAIN:  Are you having pain? Yes: NPRS scale: 5/10 Pain location: R ant shoulder, R knee Pain description: sharp, stiff Aggravating factors: motion Relieving factors: none  PRECAUTIONS: Anterior hip surgery R 01/03/24  RED FLAGS: None   WEIGHT BEARING RESTRICTIONS: No  FALLS:  Has patient fallen in last 6 months? No  LIVING ENVIRONMENT: Lives with: lives alone Lives in: House/apartment Stairs: Yes: Internal: 12 steps; on right going up Has following equipment at home: None  OCCUPATION: retired  PLOF: Independent  PATIENT GOALS: decrease pain with sports  NEXT MD VISIT: 01/03/24  OBJECTIVE:  Note: Objective measures were completed at Evaluation unless otherwise noted.  DIAGNOSTIC FINDINGS:  12/10/23 4 view x-ray of the right shoulder including AP, Grashey, scapular Y and  axial view was ordered and reviewed by myself today.  There is only  minimal arthritic change within the glenohumeral joint.  There is moderate  AC joint narrowing.  There is sclerosis with spurring off the greater  tuberosity of the  humeral head, likely indicative of impingement.  There  is undersurface spurring of the acromion.  No acute fracture noted.  12/10/23 4 views of the right knee including standing AP, Rosenberg, lateral and  sunrise views was ordered and reviewed by myself today.  There is at  moderate+ medial tibiofemoral joint space narrowing with arthritic change  as well as mild patellofemoral arthralgia.  Lateral joint line is  well-preserved.  No acute fracture noted.  No soft tissue abnormality.   PATIENT SURVEYS :  PSFS: THE PATIENT SPECIFIC FUNCTIONAL SCALE  Place score of 0-10 (0 = unable to perform activity and 10 = able to perform activity at the same level as before injury or problem)  Activity Date: 12/25/23    Yoga  3    2.Cycling 0    3.swimming  6    4.      Total Score 3      Total Score = Sum of activity scores/number of activities  Minimally Detectable Change: 3 points (for single activity); 2 points (for average score)  [facility] (nd). The Patient Specific Functional Scale . Retrieved from [email or web address]   COGNITION: Overall cognitive status: Within functional limits for tasks assessed     SENSATION: WFL  POSTURE: R LE ER , R lilac lower  UPPER EXTREMITY ROM:   Active/Passive ROM Right eval Left eval  Shoulder flexion 170ERP   Shoulder extension    Shoulder abduction 170ERP   Shoulder adduction    Shoulder internal rotation WNL ERP   Shoulder external rotation 70ERP   Elbow flexion    Elbow extension    Wrist flexion    Wrist extension    Wrist ulnar deviation    Wrist radial deviation    Wrist pronation    Wrist supination    (Blank rows = not tested)  UPPER EXTREMITY MMT:  MMT Right eval Left eval  Shoulder flexion 5-   Shoulder extension    Shoulder abduction 5-   Shoulder adduction    Shoulder internal rotation 5   Shoulder external rotation 5-   Middle trapezius     Lower trapezius    Elbow flexion    Elbow extension    Wrist flexion    Wrist extension    Wrist ulnar deviation    Wrist radial deviation    Wrist pronation    Wrist supination    Grip strength (lbs)    (Blank rows = not tested)  SHOULDER SPECIAL TESTS: Impingement tests: Hawkins/Kennedy impingement test: negative and Painful arc test: negative Rotator cuff assessment: Drop arm test: negative and Empty can test: negative  LOWER EXTREMITY ROM:     Active/Passive Right eval Left eval  Hip flexion    Hip extension    Hip abduction    Hip adduction    Hip internal rotation    Hip external rotation    Knee flexion 114/118   Knee extension +5/0   Ankle dorsiflexion    Ankle plantarflexion    Ankle inversion    Ankle eversion     (Blank rows = not tested)   LOWER EXTREMITY MMT:    MMT Right eval Left eval  Hip flexion 4   Hip extension    Hip abduction 4+   Hip adduction    Hip internal rotation    Hip external rotation    Knee flexion 4+   Knee extension 4+   Ankle dorsiflexion 5   Ankle plantarflexion    Ankle inversion    Ankle eversion     (Blank rows = not tested)   LE Functional test-  sit to stand 5 x --6 sec  Moderate restriction of R rectus and hip flexors.  JOINT MOBILITY TESTING:  NA  PALPATION:  1+ medial patella and jt line                                                                                                                              TREATMENT DATE:  12/30/23 UBE 2 min each way no resistance TB rows 3x10 blue TB ext 3x10 blue TB IR 3x10 Blue TB ER 3x10 Blue  TB LPD 3x10 Blue  Shoulder HEP  Access Code: [email or web address] URL: [email or web address] Date: 12/30/2023 Prepared by: Burnard Meth  Program Notes All of these can be done when you are off walker from hip surgery- Do NOT do if balance is off.  Wait @3  weeks post op.  Exercises - Standing Shoulder Row with Anchored Resistance  - 2 x daily - 7 x weekly -  2 sets - 10 reps - Shoulder External Rotation with Anchored Resistance  - 2 x daily - 7 x weekly - 2 sets - 10 reps - Standing Shoulder Internal Rotation with Anchored Resistance  - 2 x daily - 7 x weekly - 2 sets - 10 reps - Shoulder Extension with Resistance  - 2 x daily - 7 x weekly - 2 sets - 10 reps - Standing Lat Pull Down with Resistance - Elbows Bent  - 2 x daily - 7 x weekly - 2 sets - 10 reps    12/25/23 Initial evaluation of R shoulder and R knee completed followed by instruction and trial set of HEP.     PATIENT EDUCATION: Education details: HEP Person educated: Patient Education method: Programmer, Multimedia, Facilities Manager, Actor cues, Verbal cues, and Handouts Education comprehension: verbalized understanding, returned demonstration, and verbal cues required  HOME EXERCISE PROGRAM: Access Code: [email or web address] URL: [email or web address] Date: 12/25/2023 Prepared by: Burnard Meth  Exercises - Hip Flexor Stretch at [facility] of Bed  - 2 x daily - 7 x weekly - 1 sets - 3 reps - 25 hold - Supine Quadriceps Stretch with Strap on Table  - 2 x daily - 7 x weekly - 1 sets - 3 reps - 25 hold  ASSESSMENT:  CLINICAL IMPRESSION: Increased patient's HEP with shoulders to include resistive bands.  Corrected form when necessary with VC.  Pt demonstrated understanding.   Pt had multiple questions about upcoming hip surgery.  Discussed aftercare and her need for some assistance and PT services at home as she lives alone.  Pt urged to discuss all options with MD, nurse and insurance company. OBJECTIVE IMPAIRMENTS: decreased strength, impaired flexibility, and pain.   ACTIVITY LIMITATIONS: sports  PARTICIPATION LIMITATIONS: sports  PERSONAL FACTORS: upcoming surgery are also affecting patient's functional outcome.   REHAB POTENTIAL: Good  CLINICAL DECISION MAKING: Stable/uncomplicated  EVALUATION COMPLEXITY: Low  GOALS: Goals reviewed with patient? Yes  SHORT TERM GOALS: Target  date: 01/16/2024 3 weeks    Patient to  be independent with HEP. Baseline: Goal status: INITIAL  2.  Decrease pain by 1 level. Baseline:  Goal status: INITIAL    LONG TERM GOALS: Target date: 02/20/2024  8 weeks    Patient to be independent with self progressive HEP for shoulder and knee at discharge. Baseline:  Goal status: Initial  2. Decrease end range pain in shoulder active range of motion. Baseline:  Goal status: Initial  3.   Decrease max pain to 2 out of 10 in right upper extremity and right knee. Baseline:  Goal status: Initial 4.  Increase strength of right upper extremity by half a grade where needed. Baseline:  Goal status: Initial  5.  Increase strength of right knee musculature by half a grade where needed. Baseline:  Goal status: Initial  6. Improve score of PSFS by 3 points for measurable difference.  PLAN: PT FREQUENCY: 1-2x/week  PT DURATION: 8 weeks  PLANNED INTERVENTIONS: 97164- PT Re-evaluation, 97110-Therapeutic exercises, 97530- Therapeutic activity, [id number]- Neuromuscular re-education, 97535- Self Care, [id number]- Manual therapy, [phone]- Aquatic Therapy, [id number]- Electrical stimulation (unattended), 20560 (1-2 muscles), 20561 (3+ muscles)- Dry Needling, Patient/Family education, Taping, Joint mobilization, Cryotherapy, and Moist heat  PLAN FOR NEXT SESSION: Pt to return after THA recovery.   Burnard CHRISTELLA Meth, PT 12/30/2023, 4:59 PM

## 2023-12-31 ENCOUNTER — Telehealth: Payer: Self-pay | Admitting: *Deleted

## 2023-12-31 NOTE — Telephone Encounter (Signed)
 Patient called office and spoke with triage and wanted assistance with locating a SNF for after her surgery for STR. RNCM called patient and reviewed process of how referrals take place at hospital for patients needing SNF for rehab after hip replacement surgery. Her surgery is 01/03/24. Explained that her insurance along with how she does with therapy as well as bed availability play a role. She wants to go to [facility] and I explained she would have to contact them to see if they would be willing to accept since she is not currently a resident there. Guided on Medicare.gov to try and find alternatives for her rehab after, so she has an idea of where she would like to go pending all other variables line up.

## 2023-12-31 NOTE — Progress Notes (Signed)
 Patient called asking about rehab after surgery. Told her that is something she will need to ask her surgeon's office.

## 2024-01-02 ENCOUNTER — Telehealth: Payer: Self-pay | Admitting: *Deleted

## 2024-01-02 NOTE — Telephone Encounter (Signed)
 Patient and I have spoken several times about concerns related to her upcoming surgery tomorrow. She is not one I am following, but did go over process of SNF placement if needed and acquiring a paid CG through insurance if she has that benefit. She has no one at home to help her and she doesn't think she can go home alone. She has been referred for HHPT through [facility] and they will continue to follow her to see if she goes home or not, but will most likely need SNF. Just wanted you aware. Also she states that after her last surgery, she became very ill and had to come back in afterward due to N/V. She states she will need something for nausea and is concerned about Oxycodone . Thanks.

## 2024-01-02 NOTE — H&P (Signed)
 TOTAL HIP ADMISSION H&P  Patient is admitted for right total hip arthroplasty.  Subjective:  Chief Complaint: right hip pain  HPI: Alexis Braun, 73 y.o. female, has a history of pain and functional disability in the right hip(s) due to arthritis and patient has failed non-surgical conservative treatments for greater than 12 weeks to include NSAID's and/or analgesics, corticosteriod injections, flexibility and strengthening excercises, and activity modification.  Onset of symptoms was gradual starting a few years ago with gradually worsening course since that time.The patient noted no past surgery on the right hip(s).  Patient currently rates pain in the right hip at 10 out of 10 with activity. Patient has night pain, worsening of pain with activity and weight bearing, trendelenberg gait, pain that interfers with activities of daily living, and pain with passive range of motion. Patient has evidence of subchondral sclerosis, periarticular osteophytes, and joint space narrowing by imaging studies. This condition presents safety issues increasing the risk of falls.  There is no current active infection.  Patient Active Problem List   Diagnosis Date Noted   Unilateral primary osteoarthritis, right hip 11/27/2023   SVT (supraventricular tachycardia)    NSVT (nonsustained ventricular tachycardia) (HCC)    CAD (coronary artery disease), native coronary artery 12/2022   Dysphagia 04/21/2020   LLQ abdominal pain 04/15/2019   Gassiness 04/15/2019   Hx of adenomatous and sessile serrated colonic polyps 12/24/2017   Bladder prolapse, female, acquired    Fecal soiling due to fecal incontinence 06/17/2015   Perianal dermatitis 06/17/2015   Hemorrhoids, internal, with bleeding 06/11/2014   Internal hemorrhoids 05/21/2014   Gallbladder polyp 09/30/2012   Migraine 08/12/2012   GERD (gastroesophageal reflux disease) 01/30/2012   Mixed hyperlipidemia 05/01/2011   Chest pain 05/04/2010   Past Medical  History:  Diagnosis Date   Allergy    sulfa   Anxiety    Arthritis    Bladder prolapse, female, acquired    CAD (coronary artery disease), native coronary artery 12/2022   coronary calcium score of 54.9 with 25 to 49% mid LAD stenosis   Chest pain    per pt, over couple years   Congenital hearing loss    right   Gallbladder polyp 2014   GERD (gastroesophageal reflux disease)    Hepatitis    as teen-iv drugs   Hx of adenomatous and sessile serrated colonic polyps 12/24/2017   Hypertension    on medication   Hypoglycemia    Internal hemorrhoid    Migraines    NSVT (nonsustained ventricular tachycardia) (HCC)    noted on ziopatch 06/2023 lasting 13 beats   Ovarian cyst 04/03/1999   PONV (postoperative nausea and vomiting)    Positive hepatitis C antibody test    at age 5; has been told that she self cured No current or past treatment.   Sleep apnea    uses c-pap   SVT (supraventricular tachycardia)    noted on Ziopatch 06/2023 lasting 11 beats   Wears hearing aid    right    Past Surgical History:  Procedure Laterality Date   CHOLECYSTECTOMY N/A 10/08/2012   Procedure: LAPAROSCOPIC CHOLECYSTECTOMY WITH INTRAOPERATIVE CHOLANGIOGRAM;  Surgeon: Donnice POUR. Belinda, MD;  Location: [facility];  Service: General;  Laterality: N/A;   CHONDROPLASTY Right 03/31/2014   Procedure: CHONDROPLASTY;  Surgeon: Dempsey Sensor, MD;  Location: [facility];  Service: Orthopedics;  Laterality: Right;   COLONOSCOPY     2007   DRUG INDUCED ENDOSCOPY N/A 03/08/2021   Procedure:  DRUG INDUCED SLEEP ENDOSCOPY;  Surgeon: Carlie Clark, MD;  Location: [facility];  Service: ENT;  Laterality: N/A;   HEMORRHOID BANDING  2017   KNEE ARTHROSCOPY WITH LATERAL MENISECTOMY Right 03/31/2014   Procedure: KNEE ARTHROSCOPY WITH  PARTIAL LATERAL MENISECTOMY;  Surgeon: Dempsey Sensor, MD;  Location: [facility];  Service: Orthopedics;  Laterality: Right;   KNEE ARTHROSCOPY WITH  MEDIAL MENISECTOMY Right 03/31/2014   Procedure: KNEE ARTHROSCOPY WITH PARTIALMEDIAL MENISECTOMY;  Surgeon: Dempsey Sensor, MD;  Location: [facility];  Service: Orthopedics;  Laterality: Right;   OOPHORECTOMY  2002   Right    scelero     SIGMOIDOSCOPY     1999    No current facility-administered medications for this encounter.   Current Outpatient Medications  Medication Sig Dispense Refill Last Dose/Taking   ALPRAZolam (XANAX) 0.5 MG tablet Take 0.5 mg by mouth daily as needed for anxiety.   Taking As Needed   aspirin EC 81 MG tablet Take 81 mg by mouth at bedtime.   Taking   atenolol  (TENORMIN ) 25 MG tablet Take 1 tablet (25 mg total) by mouth daily. (Patient taking differently: Take 25 mg by mouth at bedtime.) 90 tablet 3 Taking Differently   cyclobenzaprine (FLEXERIL) 10 MG tablet Take 10 mg by mouth 3 (three) times daily as needed for muscle spasms.   Taking As Needed   estradiol  (ESTRACE ) 0.1 MG/GM vaginal cream NEW INSTRUCTIONS:  INSERT 0.5 GRAM VAGINALLY 3 TIMES A WEEK AS NEEDED FOR SYMPTOM RELIEF. (Patient taking differently: Place 0.5 g vaginally See admin instructions. INSERT 0.5 GRAM VAGINALLY 3 TIMES A WEEK AS NEEDED FOR SYMPTOM RELIEF) 42.5 g 0 Taking Differently   Eyelid Cleansers (OPTASE TTO CLEANSING WIPES) PADS Apply 1 application  topically daily as needed.   Taking As Needed   famotidine (PEPCID) 20 MG tablet Take 20 mg by mouth daily as needed for heartburn or indigestion.   Taking As Needed   meloxicam (MOBIC) 15 MG tablet Take 15 mg by mouth daily as needed for pain.   Taking As Needed   Omega-3 Fatty Acids (FISH OIL) 1000 MG CAPS Take 1,000 mg by mouth daily.   Taking   progesterone (PROMETRIUM) 100 MG capsule Take 100 mg by mouth at bedtime.   Taking   psyllium (REGULOID) 0.52 g capsule Take 0.52 g by mouth daily as needed (to promote bowel regularity).   Taking As Needed   REFRESH OPTIVE MEGA-3 0.5-1-0.5 % SOLN Place 1 drop into both eyes 4 (four) times  daily as needed (for dryness).   Taking As Needed   rosuvastatin (CRESTOR) 5 MG tablet Take 5 mg by mouth See admin instructions. Take 5 mg by mouth at bedtime on Tues/Thurs/Sat   Taking   SUMAtriptan (IMITREX) 100 MG tablet Take 100 mg by mouth daily as needed for migraine (and may repeat once in 2 hours if headache persists or recurs).   Taking As Needed   traZODone (DESYREL) 50 MG tablet Take 25 mg by mouth at bedtime as needed for sleep.   Taking As Needed   TRYPTYR 0.003 % SOLN Place 1 drop into both eyes in the morning and at bedtime.   Taking   valACYclovir  (VALTREX ) 500 MG tablet Take 1 tablet (500 mg total) by mouth daily. (Patient not taking: Reported on 06/14/2023) 90 tablet 3 Not Taking   Allergies[1]  Social History   Tobacco Use   Smoking status: Never   Smokeless tobacco: Never  Substance  Use Topics   Alcohol  use: Yes    Comment: rarely    Family History  Problem Relation Age of Onset   Heart disease Mother    Dementia Mother    Migraines Mother    Heart disease Father    Heart attack Father    Colon cancer Neg Hx    Breast cancer Neg Hx    Esophageal cancer Neg Hx    Stomach cancer Neg Hx    Rectal cancer Neg Hx    BRCA 1/2 Neg Hx      Review of Systems  Objective:  Physical Exam Vitals reviewed.  Constitutional:      Appearance: Normal appearance.  HENT:     Head: Normocephalic and atraumatic.  Eyes:     Extraocular Movements: Extraocular movements intact.     Pupils: Pupils are equal, round, and reactive to light.  Cardiovascular:     Rate and Rhythm: Normal rate and regular rhythm.  Pulmonary:     Effort: Pulmonary effort is normal.     Breath sounds: Normal breath sounds.  Abdominal:     Palpations: Abdomen is soft.  Musculoskeletal:     Cervical back: Normal range of motion and neck supple.     Right hip: Tenderness and bony tenderness present. Decreased range of motion. Decreased strength.  Neurological:     Mental Status: She is alert and  oriented to person, place, and time.  Psychiatric:        Behavior: Behavior normal.     Vital signs in last 24 hours:    Labs:   Estimated body mass index is 28.73 kg/m as calculated from the following:   Height as of 12/26/23: 5' 4.5 (1.638 m).   Weight as of 12/26/23: 77.1 kg.   Imaging Review Plain radiographs demonstrate severe degenerative joint disease of the right hip(s). The bone quality appears to be excellent for age and reported activity level.      Assessment/Plan:  End stage arthritis, right hip(s)  The patient history, physical examination, clinical judgement of the provider and imaging studies are consistent with end stage degenerative joint disease of the right hip(s) and total hip arthroplasty is deemed medically necessary. The treatment options including medical management, injection therapy, arthroscopy and arthroplasty were discussed at length. The risks and benefits of total hip arthroplasty were presented and reviewed. The risks due to aseptic loosening, infection, stiffness, dislocation/subluxation,  thromboembolic complications and other imponderables were discussed.  The patient acknowledged the explanation, agreed to proceed with the plan and consent was signed. Patient is being admitted for inpatient treatment for surgery, pain control, PT, OT, prophylactic antibiotics, VTE prophylaxis, progressive ambulation and ADL's and discharge planning.      [1]  Allergies Allergen Reactions   Sulfa Antibiotics Other (See Comments)    Reaction happened during childhood- not recalled

## 2024-01-03 ENCOUNTER — Encounter (HOSPITAL_COMMUNITY): Payer: Self-pay | Admitting: Orthopaedic Surgery

## 2024-01-03 ENCOUNTER — Inpatient Hospital Stay (HOSPITAL_COMMUNITY)
Admission: AD | Admit: 2024-01-03 | Discharge: 2024-01-07 | DRG: 470 | Disposition: A | Discharge status: Home Health | Attending: Orthopaedic Surgery | Admitting: Orthopaedic Surgery

## 2024-01-03 ENCOUNTER — Observation Stay (HOSPITAL_COMMUNITY)

## 2024-01-03 ENCOUNTER — Ambulatory Visit (HOSPITAL_COMMUNITY): Payer: Self-pay | Admitting: Anesthesiology

## 2024-01-03 ENCOUNTER — Ambulatory Visit (HOSPITAL_COMMUNITY)

## 2024-01-03 ENCOUNTER — Encounter (HOSPITAL_COMMUNITY): Payer: Self-pay | Admitting: Medical

## 2024-01-03 ENCOUNTER — Other Ambulatory Visit: Payer: Self-pay

## 2024-01-03 ENCOUNTER — Encounter (HOSPITAL_COMMUNITY)
Admission: AD | Disposition: A | Discharge status: Home Health | Payer: Self-pay | Source: Home / Self Care | Attending: Orthopaedic Surgery

## 2024-01-03 DIAGNOSIS — G4730 Sleep apnea, unspecified: Secondary | ICD-10-CM

## 2024-01-03 DIAGNOSIS — Z8249 Family history of ischemic heart disease and other diseases of the circulatory system: Secondary | ICD-10-CM

## 2024-01-03 DIAGNOSIS — Z974 Presence of external hearing-aid: Secondary | ICD-10-CM

## 2024-01-03 DIAGNOSIS — Z79899 Other long term (current) drug therapy: Secondary | ICD-10-CM

## 2024-01-03 DIAGNOSIS — K219 Gastro-esophageal reflux disease without esophagitis: Secondary | ICD-10-CM | POA: Diagnosis present

## 2024-01-03 DIAGNOSIS — I10 Essential (primary) hypertension: Secondary | ICD-10-CM | POA: Diagnosis present

## 2024-01-03 DIAGNOSIS — I2510 Atherosclerotic heart disease of native coronary artery without angina pectoris: Secondary | ICD-10-CM | POA: Diagnosis present

## 2024-01-03 DIAGNOSIS — Z96641 Presence of right artificial hip joint: Secondary | ICD-10-CM

## 2024-01-03 DIAGNOSIS — H9041 Sensorineural hearing loss, unilateral, right ear, with unrestricted hearing on the contralateral side: Secondary | ICD-10-CM | POA: Diagnosis present

## 2024-01-03 DIAGNOSIS — F419 Anxiety disorder, unspecified: Secondary | ICD-10-CM | POA: Diagnosis present

## 2024-01-03 DIAGNOSIS — Z882 Allergy status to sulfonamides status: Secondary | ICD-10-CM

## 2024-01-03 DIAGNOSIS — Z7982 Long term (current) use of aspirin: Secondary | ICD-10-CM

## 2024-01-03 DIAGNOSIS — Z791 Long term (current) use of non-steroidal anti-inflammatories (NSAID): Secondary | ICD-10-CM

## 2024-01-03 DIAGNOSIS — M1611 Unilateral primary osteoarthritis, right hip: Secondary | ICD-10-CM

## 2024-01-03 DIAGNOSIS — Z01818 Encounter for other preprocedural examination: Secondary | ICD-10-CM

## 2024-01-03 DIAGNOSIS — E782 Mixed hyperlipidemia: Secondary | ICD-10-CM | POA: Diagnosis present

## 2024-01-03 HISTORY — PX: TOTAL HIP ARTHROPLASTY: SHX124

## 2024-01-03 LAB — TYPE AND SCREEN
ABO/RH(D): A POS
Antibody Screen: NEGATIVE

## 2024-01-03 LAB — ABO/RH: ABO/RH(D): A POS

## 2024-01-03 SURGERY — ARTHROPLASTY, HIP, TOTAL, ANTERIOR APPROACH
Anesthesia: Monitor Anesthesia Care | Site: Hip | Laterality: Right

## 2024-01-03 MED ORDER — HYDROMORPHONE HCL 1 MG/ML IJ SOLN
0.5000 mg | INTRAMUSCULAR | Status: DC | PRN
  Administered 2024-01-03: 0.5 mg via INTRAVENOUS
  Administered 2024-01-05 (×2): 1 mg via INTRAVENOUS
  Filled 2024-01-03 (×3): qty 1

## 2024-01-03 MED ORDER — DIPHENHYDRAMINE HCL 12.5 MG/5ML PO ELIX: 12.5000 mg | ORAL_SOLUTION | ORAL | Status: DC | PRN

## 2024-01-03 MED ORDER — SODIUM CHLORIDE 0.9 % IR SOLN
Status: DC | PRN
  Administered 2024-01-03: 1000 mL

## 2024-01-03 MED ORDER — POVIDONE-IODINE 10 % EX SWAB
2.0000 | Freq: Once | CUTANEOUS | Status: AC
  Administered 2024-01-03: 2 via TOPICAL

## 2024-01-03 MED ORDER — SODIUM CHLORIDE 0.9 % IV SOLN: INTRAVENOUS | Status: DC

## 2024-01-03 MED ORDER — ACETAMINOPHEN 10 MG/ML IV SOLN: 1000.0000 mg | Freq: Once | INTRAVENOUS | Status: DC | PRN

## 2024-01-03 MED ORDER — ROSUVASTATIN CALCIUM 5 MG PO TABS
5.0000 mg | ORAL_TABLET | ORAL | Status: DC
  Administered 2024-01-04: 5 mg via ORAL
  Filled 2024-01-03: qty 1

## 2024-01-03 MED ORDER — PHENYLEPHRINE HCL (PRESSORS) 10 MG/ML IV SOLN
INTRAVENOUS | Status: DC | PRN
  Administered 2024-01-03 (×5): 160 ug via INTRAVENOUS

## 2024-01-03 MED ORDER — PROPOFOL 500 MG/50ML IV EMUL
INTRAVENOUS | Status: DC | PRN
  Administered 2024-01-03: 75 ug/kg/min via INTRAVENOUS

## 2024-01-03 MED ORDER — ASPIRIN 81 MG PO CHEW
81.0000 mg | CHEWABLE_TABLET | Freq: Two times a day (BID) | ORAL | Status: DC
  Administered 2024-01-03 – 2024-01-07 (×8): 81 mg via ORAL
  Filled 2024-01-03 (×8): qty 1

## 2024-01-03 MED ORDER — PROGESTERONE MICRONIZED 100 MG PO CAPS
100.0000 mg | ORAL_CAPSULE | Freq: Every day | ORAL | Status: DC
  Administered 2024-01-03 – 2024-01-06 (×4): 100 mg via ORAL
  Filled 2024-01-03 (×4): qty 1

## 2024-01-03 MED ORDER — TRANEXAMIC ACID-NACL 1000-0.7 MG/100ML-% IV SOLN
1000.0000 mg | INTRAVENOUS | Status: AC
  Administered 2024-01-03: 1000 mg via INTRAVENOUS
  Filled 2024-01-03: qty 100

## 2024-01-03 MED ORDER — ALPRAZOLAM 0.5 MG PO TABS
0.5000 mg | ORAL_TABLET | Freq: Every day | ORAL | Status: DC | PRN
  Administered 2024-01-03 – 2024-01-06 (×3): 0.5 mg via ORAL
  Filled 2024-01-03 (×5): qty 1

## 2024-01-03 MED ORDER — ONDANSETRON HCL 4 MG/2ML IJ SOLN
INTRAMUSCULAR | Status: DC | PRN
  Administered 2024-01-03: 4 mg via INTRAVENOUS

## 2024-01-03 MED ORDER — OXYCODONE HCL 5 MG PO TABS
10.0000 mg | ORAL_TABLET | ORAL | Status: DC | PRN
  Administered 2024-01-03 – 2024-01-04 (×2): 10 mg via ORAL
  Administered 2024-01-05: 5 mg via ORAL
  Filled 2024-01-03 (×2): qty 2

## 2024-01-03 MED ORDER — CHLORHEXIDINE GLUCONATE 0.12 % MT SOLN
15.0000 mL | Freq: Once | OROMUCOSAL | Status: AC
  Administered 2024-01-03: 15 mL via OROMUCOSAL

## 2024-01-03 MED ORDER — ORAL CARE MOUTH RINSE: 15.0000 mL | Freq: Once | OROMUCOSAL | Status: AC

## 2024-01-03 MED ORDER — ONDANSETRON HCL 4 MG/2ML IJ SOLN
4.0000 mg | Freq: Four times a day (QID) | INTRAMUSCULAR | Status: DC | PRN
  Administered 2024-01-03: 4 mg via INTRAVENOUS
  Filled 2024-01-03: qty 2

## 2024-01-03 MED ORDER — CYCLOBENZAPRINE HCL 10 MG PO TABS
10.0000 mg | ORAL_TABLET | Freq: Three times a day (TID) | ORAL | Status: DC | PRN
  Administered 2024-01-03 – 2024-01-07 (×8): 10 mg via ORAL
  Filled 2024-01-03 (×9): qty 1

## 2024-01-03 MED ORDER — 0.9 % SODIUM CHLORIDE (POUR BTL) OPTIME
TOPICAL | Status: DC | PRN
  Administered 2024-01-03: 1000 mL

## 2024-01-03 MED ORDER — METOCLOPRAMIDE HCL 5 MG PO TABS: 5.0000 mg | ORAL_TABLET | Freq: Three times a day (TID) | ORAL | Status: DC | PRN

## 2024-01-03 MED ORDER — DROPERIDOL 2.5 MG/ML IJ SOLN: 0.6250 mg | Freq: Once | INTRAMUSCULAR | Status: DC | PRN

## 2024-01-03 MED ORDER — DOCUSATE SODIUM 100 MG PO CAPS
100.0000 mg | ORAL_CAPSULE | Freq: Two times a day (BID) | ORAL | Status: DC
  Administered 2024-01-03 – 2024-01-07 (×8): 100 mg via ORAL
  Filled 2024-01-03 (×8): qty 1

## 2024-01-03 MED ORDER — OXYCODONE HCL 5 MG PO TABS
5.0000 mg | ORAL_TABLET | ORAL | Status: DC | PRN
  Administered 2024-01-03: 5 mg via ORAL
  Administered 2024-01-03 – 2024-01-05 (×4): 10 mg via ORAL
  Administered 2024-01-05 – 2024-01-07 (×7): 5 mg via ORAL
  Filled 2024-01-03: qty 2
  Filled 2024-01-03 (×3): qty 1
  Filled 2024-01-03: qty 2
  Filled 2024-01-03 (×5): qty 1
  Filled 2024-01-03 (×3): qty 2

## 2024-01-03 MED ORDER — FAMOTIDINE 20 MG PO TABS: 20.0000 mg | ORAL_TABLET | Freq: Every day | ORAL | Status: DC | PRN

## 2024-01-03 MED ORDER — ACETAMINOPHEN 325 MG PO TABS
325.0000 mg | ORAL_TABLET | Freq: Four times a day (QID) | ORAL | Status: DC | PRN
  Administered 2024-01-03 – 2024-01-07 (×5): 650 mg via ORAL
  Filled 2024-01-03 (×4): qty 2

## 2024-01-03 MED ORDER — METOCLOPRAMIDE HCL 5 MG/ML IJ SOLN: 5.0000 mg | Freq: Three times a day (TID) | INTRAMUSCULAR | Status: DC | PRN

## 2024-01-03 MED ORDER — ONDANSETRON HCL 4 MG PO TABS: 4.0000 mg | ORAL_TABLET | Freq: Four times a day (QID) | ORAL | Status: DC | PRN

## 2024-01-03 MED ORDER — FENTANYL CITRATE (PF) 50 MCG/ML IJ SOSY: 25.0000 ug | PREFILLED_SYRINGE | INTRAMUSCULAR | Status: DC | PRN

## 2024-01-03 MED ORDER — FENTANYL CITRATE (PF) 100 MCG/2ML IJ SOLN
INTRAMUSCULAR | Status: DC | PRN
  Administered 2024-01-03: 100 ug via INTRAVENOUS

## 2024-01-03 MED ORDER — MENTHOL 3 MG MT LOZG: 1.0000 | LOZENGE | OROMUCOSAL | Status: DC | PRN

## 2024-01-03 MED ORDER — ATENOLOL 50 MG PO TABS
25.0000 mg | ORAL_TABLET | Freq: Every day | ORAL | Status: DC
  Administered 2024-01-03 – 2024-01-06 (×4): 25 mg via ORAL
  Filled 2024-01-03 (×4): qty 1

## 2024-01-03 MED ORDER — PHENOL 1.4 % MT LIQD: 1.0000 | OROMUCOSAL | Status: DC | PRN

## 2024-01-03 MED ORDER — EPHEDRINE SULFATE (PRESSORS) 25 MG/5ML IV SOSY
PREFILLED_SYRINGE | INTRAVENOUS | Status: DC | PRN
  Administered 2024-01-03 (×2): 10 mg via INTRAVENOUS

## 2024-01-03 MED ORDER — CEFAZOLIN SODIUM-DEXTROSE 2-4 GM/100ML-% IV SOLN
2.0000 g | Freq: Four times a day (QID) | INTRAVENOUS | Status: AC
  Administered 2024-01-03 (×2): 2 g via INTRAVENOUS
  Filled 2024-01-03 (×2): qty 100

## 2024-01-03 MED ORDER — SUMATRIPTAN SUCCINATE 50 MG PO TABS: 100.0000 mg | ORAL_TABLET | ORAL | Status: DC | PRN

## 2024-01-03 MED ORDER — ACOLTREMON 0.003 % OP SOLN: 1.0000 [drp] | Freq: Every day | OPHTHALMIC | Status: DC

## 2024-01-03 MED ORDER — STERILE WATER FOR IRRIGATION IR SOLN
Status: DC | PRN
  Administered 2024-01-03: 2000 mL

## 2024-01-03 MED ORDER — POLYVINYL ALCOHOL 1.4 % OP SOLN
1.0000 [drp] | Freq: Every day | OPHTHALMIC | Status: DC
  Administered 2024-01-06: 1 [drp] via OPHTHALMIC
  Filled 2024-01-03: qty 15

## 2024-01-03 MED ORDER — CEFAZOLIN SODIUM-DEXTROSE 2-4 GM/100ML-% IV SOLN
2.0000 g | INTRAVENOUS | Status: AC
  Administered 2024-01-03: 2 g via INTRAVENOUS
  Filled 2024-01-03: qty 100

## 2024-01-03 MED ORDER — TRAMADOL HCL 50 MG PO TABS
100.0000 mg | ORAL_TABLET | Freq: Four times a day (QID) | ORAL | Status: DC | PRN
  Administered 2024-01-03 – 2024-01-07 (×4): 100 mg via ORAL
  Filled 2024-01-03 (×4): qty 2

## 2024-01-03 MED ORDER — LACTATED RINGERS IV SOLN: INTRAVENOUS | Status: DC

## 2024-01-03 MED ORDER — FENTANYL CITRATE (PF) 100 MCG/2ML IJ SOLN
INTRAMUSCULAR | Status: AC
  Filled 2024-01-03: qty 2

## 2024-01-03 MED ORDER — ACETAMINOPHEN 325 MG PO TABS
325.0000 mg | ORAL_TABLET | Freq: Four times a day (QID) | ORAL | Status: DC | PRN
  Filled 2024-01-03: qty 2

## 2024-01-03 MED ORDER — BUPIVACAINE IN DEXTROSE 0.75-8.25 % IT SOLN
INTRATHECAL | Status: DC | PRN
  Administered 2024-01-03: 1.6 mL via INTRATHECAL

## 2024-01-03 MED ORDER — DEXAMETHASONE SOD PHOSPHATE PF 10 MG/ML IJ SOLN
INTRAMUSCULAR | Status: DC | PRN
  Administered 2024-01-03: 10 mg via INTRAVENOUS

## 2024-01-03 SURGICAL SUPPLY — 36 items
BAG COUNTER SPONGE SURGICOUNT (BAG) ×1 IMPLANT
BAG ZIPLOCK 12X15 (MISCELLANEOUS) ×1 IMPLANT
BENZOIN TINCTURE PRP APPL 2/3 (GAUZE/BANDAGES/DRESSINGS) IMPLANT
BLADE SAW SGTL 18X1.27X75 (BLADE) ×1 IMPLANT
COVER PERINEAL POST (MISCELLANEOUS) ×1 IMPLANT
COVER SURGICAL LIGHT HANDLE (MISCELLANEOUS) ×1 IMPLANT
DRAPE FOOT SWITCH (DRAPES) ×1 IMPLANT
DRAPE STERI IOBAN 125X83 (DRAPES) ×1 IMPLANT
DRAPE U-SHAPE 47X51 STRL (DRAPES) ×2 IMPLANT
DRSG AQUACEL AG ADV 3.5X10 (GAUZE/BANDAGES/DRESSINGS) ×1 IMPLANT
DURAPREP 26ML APPLICATOR (WOUND CARE) ×1 IMPLANT
ELECT PENCIL ROCKER SW 15FT (MISCELLANEOUS) ×1 IMPLANT
ELECT REM PT RETURN 15FT ADLT (MISCELLANEOUS) ×1 IMPLANT
FEMORAL STEM 12/14 TPR SZ4 HIP (Orthopedic Implant) IMPLANT
GAUZE XEROFORM 1X8 LF (GAUZE/BANDAGES/DRESSINGS) IMPLANT
GLOVE BIO SURGEON STRL SZ7.5 (GLOVE) ×1 IMPLANT
GLOVE BIOGEL PI IND STRL 8 (GLOVE) ×2 IMPLANT
GLOVE SURG ORTHO 8.0 STRL STRW (GLOVE) ×1 IMPLANT
GOWN STRL REUS W/ TWL XL LVL3 (GOWN DISPOSABLE) ×2 IMPLANT
HEAD CERAMIC 36 PLUS5 (Hips) IMPLANT
HOLDER FOLEY CATH W/STRAP (MISCELLANEOUS) ×1 IMPLANT
KIT TURNOVER KIT A (KITS) ×1 IMPLANT
LINER NEUTRAL 52X36MM PLUS 4 (Liner) IMPLANT
PACK ANTERIOR HIP CUSTOM (KITS) ×1 IMPLANT
PIN SECTOR W/GRIP ACE CUP 52MM (Hips) IMPLANT
SET HNDPC FAN SPRY TIP SCT (DISPOSABLE) ×1 IMPLANT
STAPLER SKIN PROX 35W (STAPLE) IMPLANT
STRIP CLOSURE SKIN 1/2X4 (GAUZE/BANDAGES/DRESSINGS) IMPLANT
SUT ETHIBOND NAB CT1 #1 30IN (SUTURE) ×1 IMPLANT
SUT ETHILON 2 0 PS N (SUTURE) IMPLANT
SUT MNCRL AB 4-0 PS2 18 (SUTURE) IMPLANT
SUT VIC AB 0 CT1 36 (SUTURE) ×1 IMPLANT
SUT VIC AB 1 CT1 36 (SUTURE) ×1 IMPLANT
SUT VIC AB 2-0 CT1 TAPERPNT 27 (SUTURE) ×2 IMPLANT
TRAY FOLEY MTR SLVR 16FR STAT (SET/KITS/TRAYS/PACK) ×1 IMPLANT
YANKAUER SUCT BULB TIP NO VENT (SUCTIONS) ×1 IMPLANT

## 2024-01-03 NOTE — Plan of Care (Signed)

## 2024-01-03 NOTE — Interval H&P Note (Signed)
 History and Physical Interval Note: The patient understands that she is here today for a right total hip replacement to treat her significant right hip pain and arthritis.  There has been no acute or interval change in her medical status.  The risks and benefits of surgery have been discussed in detail and informed consent has been obtained.  The right operative hip has been marked.  01/03/2024 6:54 AM  Rock Levorn Pikes  has presented today for surgery, with the diagnosis of Osteoarthritis Right Hip.  The various methods of treatment have been discussed with the patient and family. After consideration of risks, benefits and other options for treatment, the patient has consented to  Procedures: ARTHROPLASTY, HIP, TOTAL, ANTERIOR APPROACH (Right) as a surgical intervention.  The patient's history has been reviewed, patient examined, no change in status, stable for surgery.  I have reviewed the patient's chart and labs.  Questions were answered to the patient's satisfaction.     Lonni CINDERELLA Poli

## 2024-01-03 NOTE — Op Note (Signed)
 "    Operative Note  Date of operation: 03/06/2023 Preoperative diagnosis: Right hip primary osteoarthritis Postoperative diagnosis: Same  Procedure: Right direct anterior total hip arthroplasty  Implants: Implant Name Type Inv. Item Serial No. Manufacturer Lot No. LRB No. Used Action  PIN SECTOR W/GRIP ACE CUP - [id number] Hips PIN SECTOR W/GRIP ACE CUP  [employer] [id number] Right 1 Implanted  LINER NEUTRAL [id number] PLUS 4 - [id number] Liner LINER NEUTRAL [id number] PLUS 4  DEPU[employer]TH[employer] [id number] Right 1 Implanted  FEMORAL STEM 12/14 TPR SZ4 HIP - [id number] Orthopedic Implant FEMORAL STEM 12/14 TPR SZ4 HIP  DEPU[employer]TH[employer] [id number] Right 1 Implanted  HEAD CERAMIC 36 PLUS5 - [id number] Hips HEAD CERAMIC 36 PLUS5  DEPU[employer] [id number] Right 1 Implanted   Surgeon: Lonni GRADE. Vernetta, MD Assistant: Randine Abbot, RNFA  Anesthesia: Spinal EBL: 100 cc Antibiotics: IV Ancef  Complications: None  Indications: The patient is a 73 year old female with well-documented severe arthritis involving her right hip.  She has tried and failed conservative treatment for over a year now.  At this point her right hip pain is daily and it is detrimentally affecting her mobility, her quality of life and her actives of daily living to the point she does wish to proceed with a hip replacement and we agree with this as well.  Her x-rays show complete loss of joint space with bone-on-bone arthritis.  We did discuss in length and detailed the risks of acute blood loss anemia, nerve and vessel injury, fracture, infection, DVT, implant failure, dislocation, leg length differences and wound healing issues.  She understands that our goals are hopefully decreased pain, improved mobility and improved quality of life.  Procedure description: After informed consent was obtained appropriate right hip was marked, the patient was brought to the operating room and set up on the stretcher where spinal  anesthesia was obtained.  She was then laid in the supine position on stretcher and a Foley catheter was placed.  Traction boots were placed on both her feet and next she was placed supine on the Hana fracture table with a perineal post and placed in both legs in inline skeletal traction devices no traction applied.  Her right operative hip and pelvis were assessed radiographically.  The right hip was prepped and draped with DuraPrep and sterile drapes.  A timeout was called and she was identified as the correct patient the correct right hip.  An incision was then made just inferior and posterior to the ASIS and carried slightly obliquely down the leg.  Dissection was carried down to the tensor fascia lata muscle and the tensor fascia was then divided longitudinally to proceed with a direct intra broach the hip.  Circumflex vessels were identified and cauterized.  The hip capsule was identified and opened up finding a moderate joint effusion.  Cobra retractors were placed around the medial and lateral femoral neck and a femoral neck cut was made with an oscillating saw just proximal to the lesser trochanter and this cut was completed with an osteotome.  A corkscrew guide was placed in the femoral head and the femoral head was removed in its entirety and there was a wide area of completely devoid of cartilage.  A bent Hohmann was then placed over the medial acetabular rim and the acetabular labrum and other debris removed.  Reaming was then initiated from a size 43 reamer and stepwise increments going up to a size 51 reamer with all reamers placed under direct visualization and  the last reamer also placed under direct fluoroscopy in order to obtain the depth and reaming, the inclination and anteversion.  The real DePuy sector GRIPTION acetabular component size 52 was then placed without difficulty followed by a 36+4 polythene liner.  Attention was then turned to the femur.  With the right leg externally rotated to  130 degrees, extended and adducted, a Mueller retractor was placed medially and a Hohmann retractor behind the greater trochanter.  The lateral joint capsule was released and a box cutting osteotome was used into the femoral canal.  Broaching was then initiated using the Actis broaching system from a size 0 going to a size 4.  Based on her anatomy we trialed a high offset femoral neck and a 36+1.5 trial head ball.  The right leg was brought over and up and with traction and internal rotation reduced in the pelvis.  Based on radiographic and clinical assessment we felt like she needed a little bit more leg length.  Of note her leg lengths were equal prior to surgery so we will make adjustments radiographically as well.  Although she looks like she is shorter on radiographs we know clinically she is longer close it feels tight and we do need to go to a +5 ball.  We dislocated the hip and remove the trial components.  We then placed the real Actis femoral component with high offset size 4 and with the real 36+5 ceramic head ball.  Again this was reduced in the pelvis and assessing it radiographically and clinically we were very pleased with range of motion and stability and the tightness as well as offset and leg length.  The soft tissue was then irrigated with normal saline solution.  Parts of the joint capsule were closed with interrupted #1 Ethibond suture followed by #1 Vicryl to close the tensor fascia.  0 Vicryl was used to close the deep tissue and 2-0 Vicryl is used to close subcutaneous tissue.  The skin was closed with staples.  Xeroform and an Aquacel dressing were applied.  The patient was taken off the Hana table and taken the recovery room. "

## 2024-01-03 NOTE — Anesthesia Procedure Notes (Signed)
 Spinal  Patient location during procedure: OR Start time: 01/03/2024 7:20 AM End time: 01/03/2024 7:23 AM  Staffing Performed: anesthesiologist  Authorized by: Dorethea Cordella SQUIBB, DO   Performed by: Dorethea Cordella SQUIBB, DO  Preanesthetic Checklist Completed: patient identified, IV checked, site marked, risks and benefits discussed, surgical consent, monitors and equipment checked, pre-op evaluation and timeout performed Spinal Block Patient position: sitting Prep: DuraPrep Patient monitoring: heart rate, cardiac monitor, continuous pulse ox and blood pressure Approach: midline Location: L3-4 Injection technique: single-shot Needle Needle type: Pencan  Needle gauge: 24 G Needle length: 10 cm Assessment Events: CSF return  Additional Notes Patient identified. Risks/Benefits/Options discussed with patient including but not limited to bleeding, infection, nerve damage, paralysis, failed block, incomplete pain control, headache, blood pressure changes, nausea, vomiting, reactions to medications, itching and postpartum back pain. Confirmed with bedside nurse the patient's most recent platelet count. Confirmed with patient that they are not currently taking any anticoagulation, have any bleeding history or any family history of bleeding disorders. Patient expressed understanding and wished to proceed. All questions were answered. Sterile technique was used throughout the entire procedure. Please see nursing notes for vital signs. Warning signs of high block given to the patient including shortness of breath, tingling/numbness in hands, complete motor block, or any concerning symptoms with instructions to call for help. Patient was given instructions on fall risk and not to get out of bed. All questions and concerns addressed with instructions to call with any issues or inadequate analgesia.

## 2024-01-03 NOTE — Evaluation (Signed)
 Physical Therapy Evaluation Patient Details Name: Alexis Braun MRN: [id number] DOB: 11-08-1950 Today's Date: 01/03/2024  History of Present Illness  Pt s/p R THR and with hx of CAD, congenital hearing loss and NSVT  Clinical Impression  Pt s/p R THR and presents with decreased R LE strength/ROM and post op pain limiting functional mobility.  Pt motivated but limited by pain this date and reports has NO assist at home so may need to consider follow up at SNF level to maximize IND and safety prior to return home.        If plan is discharge home, recommend the following: A lot of help with walking and/or transfers;A little help with bathing/dressing/bathroom;Assistance with cooking/housework;Help with stairs or ramp for entrance;Assist for transportation   Can travel by private vehicle   No    Equipment Recommendations Rolling walker (2 wheels)  Recommendations for Other Services       Functional Status Assessment Patient has had a recent decline in their functional status and demonstrates the ability to make significant improvements in function in a reasonable and predictable amount of time.     Precautions / Restrictions Precautions Precautions: Fall Restrictions Weight Bearing Restrictions Per Provider Order: Yes RLE Weight Bearing Per Provider Order: Weight bearing as tolerated      Mobility  Bed Mobility Overal bed mobility: Needs Assistance Bed Mobility: Supine to Sit     Supine to sit: Min assist, Mod assist     General bed mobility comments: INcreased time with cues for sequence and use of L LE to self assist    Transfers Overall transfer level: Needs assistance Equipment used: Rolling walker (2 wheels) Transfers: Sit to/from Stand Sit to Stand: Min assist, +2 physical assistance, +2 safety/equipment           General transfer comment: cues for LE management and use of UEs to self assist    Ambulation/Gait Ambulation/Gait assistance: Min assist,  +2 physical assistance, +2 safety/equipment Gait Distance (Feet): 3 Feet Assistive device: Rolling walker (2 wheels) Gait Pattern/deviations: Step-to pattern, Decreased step length - right, Decreased step length - left, Shuffle, Trunk flexed Gait velocity: decr     General Gait Details: cues for sequence, posture and position from RW; difficutly advancing R LE and requring assist.  Distance ltd by fatigue and elevating pain level  Stairs            Wheelchair Mobility     Tilt Bed    Modified Rankin (Stroke Patients Only)       Balance Overall balance assessment: Needs assistance Sitting-balance support: No upper extremity supported, Feet supported Sitting balance-Leahy Scale: Good     Standing balance support: Bilateral upper extremity supported Standing balance-Leahy Scale: Poor                               Pertinent Vitals/Pain Pain Assessment Pain Assessment: 0-10 Pain Score: 7  Pain Location: R hip and thigh Pain Descriptors / Indicators: Aching, Burning, Sore, Grimacing Pain Intervention(s): Limited activity within patient's tolerance, Monitored during session, Premedicated before session, Ice applied    Home Living Family/patient expects to be discharged to:: Private residence Living Arrangements: Alone Available Help at Discharge: Friend(s);Available PRN/intermittently Type of Home: House Home Access: Stairs to enter Entrance Stairs-Rails: Right Entrance Stairs-Number of Steps: 8   Home Layout: One level Home Equipment: Cane - single point      Prior Function Prior Level of Function :  Independent/Modified Independent                     Extremity/Trunk Assessment   Upper Extremity Assessment Upper Extremity Assessment: Overall WFL for tasks assessed    Lower Extremity Assessment Lower Extremity Assessment: RLE deficits/detail RLE: Unable to fully assess due to pain    Cervical / Trunk Assessment Cervical / Trunk  Assessment: Normal  Communication   Communication Communication: Impaired Factors Affecting Communication: Hearing impaired    Cognition Arousal: Alert Behavior During Therapy: WFL for tasks assessed/performed   PT - Cognitive impairments: No apparent impairments                         Following commands: Intact       Cueing Cueing Techniques: Verbal cues     General Comments      Exercises Total Joint Exercises Ankle Circles/Pumps: AROM, Both, 15 reps, Supine   Assessment/Plan    PT Assessment Patient needs continued PT services  PT Problem List Decreased strength;Decreased range of motion;Decreased activity tolerance;Decreased balance;Decreased mobility;Decreased knowledge of use of DME;Pain       PT Treatment Interventions Gait training;DME instruction;Stair training;Functional mobility training;Therapeutic activities;Therapeutic exercise;Balance training;Patient/family education    PT Goals (Current goals can be found in the Care Plan section)  Acute Rehab PT Goals Patient Stated Goal: Regain IND PT Goal Formulation: With patient Time For Goal Achievement: 01/10/24 Potential to Achieve Goals: Good    Frequency 7X/week     Co-evaluation               AM-PAC PT 6 Clicks Mobility  Outcome Measure Help needed turning from your back to your side while in a flat bed without using bedrails?: A Lot Help needed moving from lying on your back to sitting on the side of a flat bed without using bedrails?: A Lot Help needed moving to and from a bed to a chair (including a wheelchair)?: A Lot Help needed standing up from a chair using your arms (e.g., wheelchair or bedside chair)?: A Lot Help needed to walk in hospital room?: Total Help needed climbing 3-5 steps with a railing? : Total 6 Click Score: 10    End of Session Equipment Utilized During Treatment: Gait belt Activity Tolerance: Patient limited by fatigue;Patient limited by pain Patient  left: in chair;with call bell/phone within reach;with chair alarm set Nurse Communication: Mobility status PT Visit Diagnosis: Difficulty in walking, not elsewhere classified (R26.2);Pain Pain - Right/Left: Right Pain - part of body: Hip    Time: 8671-8646 PT Time Calculation (min) (ACUTE ONLY): 25 min   Charges:   PT Evaluation $PT Eval Low Complexity: 1 Low PT Treatments $Therapeutic Activity: 8-22 mins PT General Charges $$ ACUTE PT VISIT: 1 Visit         3.hun   Terril Amaro 01/03/2024, 3:53 PM

## 2024-01-03 NOTE — Progress Notes (Signed)
" °   01/03/24 2322  BiPAP/CPAP/SIPAP  $ Non-Invasive Home Ventilator  Initial  BiPAP/CPAP/SIPAP Pt Type Adult  BiPAP/CPAP/SIPAP Resmed  Respiratory Rate 18 breaths/min  FiO2 (%) 21 %  Patient Home Machine No  Patient Home Mask Yes  Patient Home Tubing No  Auto Titrate Yes  Minimum cmH2O 5 cmH2O  Maximum cmH2O 15 cmH2O  CPAP/SIPAP surface wiped down Yes  Device Plugged into RED Power Outlet Yes  BiPAP/CPAP /SiPAP Vitals  Pulse Rate 65  Resp 18  SpO2 98 %  Bilateral Breath Sounds Clear;Diminished    "

## 2024-01-03 NOTE — Transfer of Care (Signed)
 Immediate Anesthesia Transfer of Care Note  Patient: Alexis Braun  Procedure(s) Performed: ARTHROPLASTY, HIP, TOTAL, ANTERIOR APPROACH (Right: Hip)  Patient Location: PACU  Anesthesia Type:Spinal  Level of Consciousness: sedated, patient cooperative, and responds to stimulation  Airway & Oxygen Therapy: Patient Spontanous Breathing and Patient connected to face mask oxygen  Post-op Assessment: Report given to RN and Post -op Vital signs reviewed and stable  Post vital signs: Reviewed and stable  Last Vitals:  Vitals Value Taken Time  BP    Temp    Pulse    Resp    SpO2      Last Pain:  Vitals:   01/03/24 0628  TempSrc: Oral  PainSc:          Complications: No notable events documented.

## 2024-01-03 NOTE — Anesthesia Postprocedure Evaluation (Signed)
"   Anesthesia Post Note  Patient: Alexis Braun  Procedure(s) Performed: ARTHROPLASTY, HIP, TOTAL, ANTERIOR APPROACH (Right: Hip)     Patient location during evaluation: PACU Anesthesia Type: MAC and Spinal Level of consciousness: awake and alert Pain management: pain level controlled Vital Signs Assessment: post-procedure vital signs reviewed and stable Respiratory status: spontaneous breathing, nonlabored ventilation, respiratory function stable and patient connected to nasal cannula oxygen Cardiovascular status: stable and blood pressure returned to baseline Postop Assessment: no apparent nausea or vomiting Anesthetic complications: no   No notable events documented.  Last Vitals:  Vitals:   01/03/24 1233 01/03/24 1359  BP: 131/63 (!) 145/79  Pulse: 72 92  Resp: 17 18  Temp: 36.8 C 36.5 C  SpO2: 98% 98%    Last Pain:  Vitals:   01/03/24 1359  TempSrc: Oral  PainSc:                  Cordella SQUIBB Tremane Spurgeon      "

## 2024-01-04 DIAGNOSIS — E782 Mixed hyperlipidemia: Secondary | ICD-10-CM | POA: Diagnosis present

## 2024-01-04 DIAGNOSIS — I10 Essential (primary) hypertension: Secondary | ICD-10-CM | POA: Diagnosis present

## 2024-01-04 DIAGNOSIS — F419 Anxiety disorder, unspecified: Secondary | ICD-10-CM | POA: Diagnosis present

## 2024-01-04 DIAGNOSIS — I2510 Atherosclerotic heart disease of native coronary artery without angina pectoris: Secondary | ICD-10-CM | POA: Diagnosis present

## 2024-01-04 DIAGNOSIS — K219 Gastro-esophageal reflux disease without esophagitis: Secondary | ICD-10-CM | POA: Diagnosis present

## 2024-01-04 DIAGNOSIS — Z791 Long term (current) use of non-steroidal anti-inflammatories (NSAID): Secondary | ICD-10-CM | POA: Diagnosis not present

## 2024-01-04 DIAGNOSIS — M1611 Unilateral primary osteoarthritis, right hip: Secondary | ICD-10-CM | POA: Diagnosis present

## 2024-01-04 DIAGNOSIS — H9041 Sensorineural hearing loss, unilateral, right ear, with unrestricted hearing on the contralateral side: Secondary | ICD-10-CM | POA: Diagnosis present

## 2024-01-04 DIAGNOSIS — Z974 Presence of external hearing-aid: Secondary | ICD-10-CM | POA: Diagnosis not present

## 2024-01-04 DIAGNOSIS — Z882 Allergy status to sulfonamides status: Secondary | ICD-10-CM | POA: Diagnosis not present

## 2024-01-04 DIAGNOSIS — Z8249 Family history of ischemic heart disease and other diseases of the circulatory system: Secondary | ICD-10-CM | POA: Diagnosis not present

## 2024-01-04 DIAGNOSIS — Z79899 Other long term (current) drug therapy: Secondary | ICD-10-CM | POA: Diagnosis not present

## 2024-01-04 DIAGNOSIS — Z7982 Long term (current) use of aspirin: Secondary | ICD-10-CM | POA: Diagnosis not present

## 2024-01-04 LAB — CBC
HCT: 31.8 % — ABNORMAL LOW (ref 36.0–46.0)
Hemoglobin: 10.9 g/dL — ABNORMAL LOW (ref 12.0–15.0)
MCH: 31.5 pg (ref 26.0–34.0)
MCHC: 34.3 g/dL (ref 30.0–36.0)
MCV: 91.9 fL (ref 80.0–100.0)
Platelets: 224 K/uL (ref 150–400)
RBC: 3.46 MIL/uL — ABNORMAL LOW (ref 3.87–5.11)
RDW: 11.9 % (ref 11.5–15.5)
WBC: 11 K/uL — ABNORMAL HIGH (ref 4.0–10.5)
nRBC: 0 % (ref 0.0–0.2)

## 2024-01-04 LAB — BASIC METABOLIC PANEL WITH GFR
Anion gap: 8 (ref 5–15)
BUN: 15 mg/dL (ref 8–23)
CO2: 26 mmol/L (ref 22–32)
Calcium: 9.4 mg/dL (ref 8.9–10.3)
Chloride: 105 mmol/L (ref 98–111)
Creatinine, Ser: 0.66 mg/dL (ref 0.44–1.00)
GFR, Estimated: >60 mL/min
Glucose, Bld: 116 mg/dL — ABNORMAL HIGH (ref 70–99)
Potassium: 4.2 mmol/L (ref 3.5–5.1)
Sodium: 139 mmol/L (ref 135–145)

## 2024-01-04 MED ORDER — KETOROLAC TROMETHAMINE 15 MG/ML IJ SOLN
7.5000 mg | Freq: Four times a day (QID) | INTRAMUSCULAR | Status: AC
  Administered 2024-01-04 – 2024-01-05 (×3): 7.5 mg via INTRAVENOUS
  Filled 2024-01-04 (×3): qty 1

## 2024-01-04 NOTE — Progress Notes (Signed)
 Subjective: 1 Day Post-Op Procedures (LRB): ARTHROPLASTY, HIP, TOTAL, ANTERIOR APPROACH (Right) Patient reports pain as moderate.  She does state that she had a tough evening with pain control.  She looks better this morning.  Therapy is currently working with her.  She does live alone and is in need of short-term skilled nursing following this hospitalization.  Objective: Vital signs in last 24 hours: Temp:  [97.6 F (36.4 C)-98.6 F (37 C)] 98.6 F (37 C) (12/20 1033) Pulse Rate:  [52-92] 53 (12/20 1115) Resp:  [16-18] 18 (12/20 1033) BP: (98-145)/(35-81) 98/51 (12/20 1115) SpO2:  [97 %-100 %] 100 % (12/20 1033) FiO2 (%):  [21 %] 21 % (12/19 2322) Weight:  [77.1 kg] 77.1 kg (12/19 1140)  Intake/Output from previous day: 12/19 0701 - 12/20 0700 In: 2486 [P.O.:477; I.V.:1909; IV Piggyback:100] Out: 2200 [Urine:2050; Blood:150] Intake/Output this shift: Total I/O In: 120 [P.O.:120] Out: -   Recent Labs    01/04/24 0637  HGB 10.9*   Recent Labs    01/04/24 0637  WBC 11.0*  RBC 3.46*  HCT 31.8*  PLT 224   Recent Labs    01/04/24 0637  NA 139  K 4.2  CL 105  CO2 26  BUN 15  CREATININE 0.66  GLUCOSE 116*  CALCIUM  9.4   No results for input(s): LABPT, INR in the last 72 hours.  Sensation intact distally Intact pulses distally Dorsiflexion/Plantar flexion intact Incision: scant drainage   Assessment/Plan: 1 Day Post-Op Procedures (LRB): ARTHROPLASTY, HIP, TOTAL, ANTERIOR APPROACH (Right) Up with therapy Discharge to SNF early next week  Will change to inpatient admission since the patient will be here until early next week when hopefully the transitional care team will be able to secure short-term skilled nursing placement since the patient lives alone and has no family support.    Alexis Braun 01/04/2024, 11:38 AM

## 2024-01-04 NOTE — Care Management Obs Status (Signed)
 MEDICARE OBSERVATION STATUS NOTIFICATION   Patient Details  Name: Nautica Hotz MRN: [id number] Date of Birth: 1950-02-25   Medicare Observation Status Notification Given:  Yes    Sonda Manuella Quill, RN 01/04/2024, 10:30 AM

## 2024-01-04 NOTE — Discharge Instructions (Signed)

## 2024-01-04 NOTE — Progress Notes (Signed)
 Physical Therapy Treatment Patient Details Name: Alexis Braun MRN: [id number] DOB: 02-Dec-1950 Today's Date: 01/04/2024   History of Present Illness Pt s/p R THR and with hx of CAD, congenital hearing loss and NSVT    PT Comments  Pt cooperative and with noted improvement in stability and activity tolerance despite ongoing pain control issues.    If plan is discharge home, recommend the following: A lot of help with walking and/or transfers;A little help with bathing/dressing/bathroom;Assistance with cooking/housework;Help with stairs or ramp for entrance;Assist for transportation   Can travel by private vehicle     No  Equipment Recommendations  Rolling walker (2 wheels)    Recommendations for Other Services       Precautions / Restrictions Precautions Precautions: Fall Restrictions Weight Bearing Restrictions Per Provider Order: Yes RLE Weight Bearing Per Provider Order: Weight bearing as tolerated     Mobility  Bed Mobility               General bed mobility comments: Up in chair and requests back to same    Transfers Overall transfer level: Needs assistance Equipment used: Rolling walker (2 wheels) Transfers: Sit to/from Stand Sit to Stand: Min assist           General transfer comment: cues for LE management and use of UEs to self assist    Ambulation/Gait Ambulation/Gait assistance: Min assist, +2 safety/equipment (chair follow) Gait Distance (Feet): 38 Feet Assistive device: Rolling walker (2 wheels) Gait Pattern/deviations: Step-to pattern, Decreased step length - right, Decreased step length - left, Shuffle, Trunk flexed Gait velocity: decr     General Gait Details: cues for sequence, posture and position from RW; difficutly advancing R LE and requring assist.  Distance ltd by fatigue and elevating pain level   Stairs             Wheelchair Mobility     Tilt Bed    Modified Rankin (Stroke Patients Only)       Balance  Overall balance assessment: Needs assistance Sitting-balance support: No upper extremity supported, Feet supported Sitting balance-Leahy Scale: Good     Standing balance support: Single extremity supported Standing balance-Leahy Scale: Poor                              Communication Communication Communication: Impaired Factors Affecting Communication: Hearing impaired  Cognition Arousal: Alert Behavior During Therapy: WFL for tasks assessed/performed   PT - Cognitive impairments: No apparent impairments                         Following commands: Intact      Cueing Cueing Techniques: Verbal cues  Exercises Total Joint Exercises Ankle Circles/Pumps: AROM, Both, 15 reps, Supine    General Comments        Pertinent Vitals/Pain Pain Assessment Pain Assessment: 0-10 Pain Score: 6  Pain Location: R hip and thigh Pain Descriptors / Indicators: Aching, Burning, Sore, Grimacing Pain Intervention(s): Limited activity within patient's tolerance, Monitored during session, Premedicated before session, Ice applied    Home Living                          Prior Function            PT Goals (current goals can now be found in the care plan section) Acute Rehab PT Goals Patient Stated Goal: Regain IND PT Goal  Formulation: With patient Time For Goal Achievement: 01/10/24 Potential to Achieve Goals: Good Progress towards PT goals: Progressing toward goals    Frequency    7X/week      PT Plan      Co-evaluation              AM-PAC PT 6 Clicks Mobility   Outcome Measure  Help needed turning from your back to your side while in a flat bed without using bedrails?: A Lot Help needed moving from lying on your back to sitting on the side of a flat bed without using bedrails?: A Lot Help needed moving to and from a bed to a chair (including a wheelchair)?: A Lot Help needed standing up from a chair using your arms (e.g., wheelchair  or bedside chair)?: A Lot Help needed to walk in hospital room?: A Lot Help needed climbing 3-5 steps with a railing? : Total 6 Click Score: 11    End of Session Equipment Utilized During Treatment: Gait belt Activity Tolerance: Patient tolerated treatment well;Patient limited by fatigue;Patient limited by pain Patient left: in chair;with call bell/phone within reach;with chair alarm set Nurse Communication: Mobility status PT Visit Diagnosis: Difficulty in walking, not elsewhere classified (R26.2);Pain Pain - Right/Left: Right Pain - part of body: Hip     Time: 8677-8656 PT Time Calculation (min) (ACUTE ONLY): 21 min  Charges:    $Gait Training: 8-22 mins PT General Charges $$ ACUTE PT VISIT: 1 Visit                     [facility] PT Acute Rehabilitation Services Office [phone]    Fatemah Pourciau 01/04/2024, 12:35 PM

## 2024-01-04 NOTE — Plan of Care (Signed)

## 2024-01-04 NOTE — Progress Notes (Signed)
" °   01/04/24 1956  BiPAP/CPAP/SIPAP  BiPAP/CPAP/SIPAP Pt Type Adult  BiPAP/CPAP/SIPAP Resmed  FiO2 (%) 21 %  Patient Home Machine No  Patient Home Mask Yes  Patient Home Tubing No  Auto Titrate Yes  Minimum cmH2O 5 cmH2O  Maximum cmH2O 15 cmH2O  CPAP/SIPAP surface wiped down Yes  Device Plugged into RED Power Outlet Yes  BiPAP/CPAP /SiPAP Vitals  Pulse Rate 61  Resp 15  SpO2 100 %  Bilateral Breath Sounds Clear  MEWS Score/Color  MEWS Score 0  MEWS Score Color Green    "

## 2024-01-04 NOTE — Progress Notes (Signed)
 Physical Therapy Treatment Patient Details Name: Alexis Braun MRN: [id number] DOB: 03/11/1950 Today's Date: 01/04/2024   History of Present Illness Pt s/p R THR and with hx of CAD, congenital hearing loss and NSVT    PT Comments  Pt motivated and continues progressing steadily with mobility including decreased level of assist for most tasks and increasing activity tolerance.  Pt pleased with progress this pm and with improved pain control.    If plan is discharge home, recommend the following: A lot of help with walking and/or transfers;A little help with bathing/dressing/bathroom;Assistance with cooking/housework;Help with stairs or ramp for entrance;Assist for transportation   Can travel by private vehicle     No  Equipment Recommendations  Rolling walker (2 wheels)    Recommendations for Other Services       Precautions / Restrictions Precautions Precautions: Fall Restrictions Weight Bearing Restrictions Per Provider Order: Yes RLE Weight Bearing Per Provider Order: Weight bearing as tolerated     Mobility  Bed Mobility Overal bed mobility: Needs Assistance Bed Mobility: Sit to Supine       Sit to supine: Min assist   General bed mobility comments: cues for sequence and use of L LE to self assist    Transfers Overall transfer level: Needs assistance Equipment used: Rolling walker (2 wheels) Transfers: Sit to/from Stand Sit to Stand: Min assist, Contact guard assist           General transfer comment: cues for LE management and use of UEs to self assist    Ambulation/Gait Ambulation/Gait assistance: Min assist, Contact guard assist Gait Distance (Feet): 45 Feet (45' twice) Assistive device: Rolling walker (2 wheels) Gait Pattern/deviations: Step-to pattern, Decreased step length - right, Decreased step length - left, Shuffle, Trunk flexed Gait velocity: decr     General Gait Details: min cues for sequence, posture and position from [employer]              Wheelchair Mobility     Tilt Bed    Modified Rankin (Stroke Patients Only)       Balance Overall balance assessment: Needs assistance Sitting-balance support: No upper extremity supported, Feet supported Sitting balance-Leahy Scale: Good     Standing balance support: Single extremity supported Standing balance-Leahy Scale: Poor                              Communication Communication Communication: Impaired Factors Affecting Communication: Hearing impaired  Cognition Arousal: Alert Behavior During Therapy: WFL for tasks assessed/performed   PT - Cognitive impairments: No apparent impairments                         Following commands: Intact      Cueing Cueing Techniques: Verbal cues  Exercises Total Joint Exercises Ankle Circles/Pumps: AROM, Both, 15 reps, Supine    General Comments        Pertinent Vitals/Pain Pain Assessment Pain Assessment: 0-10 Pain Score: 5  Pain Location: R hip and thigh Pain Descriptors / Indicators: Aching, Burning, Sore, Grimacing Pain Intervention(s): Limited activity within patient's tolerance, Monitored during session, Premedicated before session, Ice applied    Home Living                          Prior Function            PT Goals (current goals can now be  found in the care plan section) Acute Rehab PT Goals Patient Stated Goal: Regain IND PT Goal Formulation: With patient Time For Goal Achievement: 01/10/24 Potential to Achieve Goals: Good Progress towards PT goals: Progressing toward goals    Frequency    7X/week      PT Plan      Co-evaluation              AM-PAC PT 6 Clicks Mobility   Outcome Measure  Help needed turning from your back to your side while in a flat bed without using bedrails?: A Little Help needed moving from lying on your back to sitting on the side of a flat bed without using bedrails?: A Little Help needed moving to and from  a bed to a chair (including a wheelchair)?: A Little Help needed standing up from a chair using your arms (e.g., wheelchair or bedside chair)?: A Little Help needed to walk in hospital room?: A Little Help needed climbing 3-5 steps with a railing? : A Lot 6 Click Score: 17    End of Session Equipment Utilized During Treatment: Gait belt Activity Tolerance: Patient tolerated treatment well;Patient limited by fatigue;Patient limited by pain Patient left: with call bell/phone within reach;in bed;with bed alarm set Nurse Communication: Mobility status PT Visit Diagnosis: Difficulty in walking, not elsewhere classified (R26.2);Pain Pain - Right/Left: Right Pain - part of body: Hip     Time: 8397-8371 PT Time Calculation (min) (ACUTE ONLY): 26 min  Charges:    $Gait Training: 23-37 mins PT General Charges $$ ACUTE PT VISIT: 1 Visit                     [facility] PT Acute Rehabilitation Services Office [phone]    Menachem Urbanek 01/04/2024, 4:38 PM

## 2024-01-05 NOTE — Progress Notes (Signed)
 Physical Therapy Treatment Patient Details Name: Alexis Braun MRN: [id number] DOB: October 05, 1950 Today's Date: 01/05/2024   History of Present Illness Pt s/p R THR and with hx of CAD, congenital hearing loss and NSVT    PT Comments  Pt requesting return to bed 2* pain/fatigue.  Pt up to ambulate limited distance to hall and back to bed requiring increased assist.  Positioned for comfort with ice packs in place.    If plan is discharge home, recommend the following: A little help with bathing/dressing/bathroom;Assistance with cooking/housework;Help with stairs or ramp for entrance;Assist for transportation;A little help with walking and/or transfers   Can travel by private vehicle     No  Equipment Recommendations  Rolling walker (2 wheels)    Recommendations for Other Services       Precautions / Restrictions Precautions Precautions: Fall Restrictions Weight Bearing Restrictions Per Provider Order: Yes RLE Weight Bearing Per Provider Order: Weight bearing as tolerated     Mobility  Bed Mobility Overal bed mobility: Needs Assistance Bed Mobility: Sit to Supine     Supine to sit: Min assist Sit to supine: Min assist   General bed mobility comments: cues for sequence and use of L LE to self assist; min assist to manage R LE 2* increased pain    Transfers Overall transfer level: Needs assistance Equipment used: Rolling walker (2 wheels) Transfers: Sit to/from Stand Sit to Stand: Contact guard assist           General transfer comment: cues for LE management and use of UEs to self assist    Ambulation/Gait Ambulation/Gait assistance: Contact guard assist Gait Distance (Feet): 21 Feet Assistive device: Rolling walker (2 wheels) Gait Pattern/deviations: Step-to pattern, Decreased step length - right, Decreased step length - left, Shuffle, Trunk flexed Gait velocity: decr     General Gait Details: min cues for sequence, posture and position from RW; distance  pain limited   Stairs             Wheelchair Mobility     Tilt Bed    Modified Rankin (Stroke Patients Only)       Balance Overall balance assessment: Needs assistance Sitting-balance support: No upper extremity supported, Feet supported Sitting balance-Leahy Scale: Good     Standing balance support: Single extremity supported Standing balance-Leahy Scale: Poor                              Communication Communication Communication: Impaired Factors Affecting Communication: Hearing impaired  Cognition Arousal: Alert Behavior During Therapy: WFL for tasks assessed/performed   PT - Cognitive impairments: No apparent impairments                         Following commands: Intact      Cueing Cueing Techniques: Verbal cues  Exercises Total Joint Exercises Ankle Circles/Pumps: AROM, Both, 15 reps, Supine Quad Sets: AROM, Both, 10 reps, Supine Heel Slides: AAROM, Right, 20 reps, Supine Hip ABduction/ADduction: AAROM, Right, 15 reps, Supine    General Comments        Pertinent Vitals/Pain Pain Assessment Pain Assessment: 0-10 Pain Score: 7  Pain Location: R hip and thigh Pain Descriptors / Indicators: Aching, Burning, Sore, Grimacing Pain Intervention(s): Limited activity within patient's tolerance, Monitored during session, Premedicated before session, Ice applied    Home Living  Prior Function            PT Goals (current goals can now be found in the care plan section) Acute Rehab PT Goals Patient Stated Goal: Regain IND PT Goal Formulation: With patient Time For Goal Achievement: 01/10/24 Potential to Achieve Goals: Good Progress towards PT goals: Not progressing toward goals - comment (increased pain this date)    Frequency    7X/week      PT Plan      Co-evaluation              AM-PAC PT 6 Clicks Mobility   Outcome Measure  Help needed turning from your back to  your side while in a flat bed without using bedrails?: A Little Help needed moving from lying on your back to sitting on the side of a flat bed without using bedrails?: A Little Help needed moving to and from a bed to a chair (including a wheelchair)?: A Little Help needed standing up from a chair using your arms (e.g., wheelchair or bedside chair)?: A Little Help needed to walk in hospital room?: A Little Help needed climbing 3-5 steps with a railing? : A Lot 6 Click Score: 17    End of Session Equipment Utilized During Treatment: Gait belt Activity Tolerance: Patient limited by pain Patient left: in bed;with call bell/phone within reach;with bed alarm set Nurse Communication: Mobility status PT Visit Diagnosis: Difficulty in walking, not elsewhere classified (R26.2);Pain Pain - Right/Left: Right Pain - part of body: Hip     Time: 8984-8967 PT Time Calculation (min) (ACUTE ONLY): 17 min  Charges:    $Therapeutic Activity: 8-22 mins PT General Charges $$ ACUTE PT VISIT: 1 Visit                     [facility] PT Acute Rehabilitation Services Office [phone]    Hiram Mciver 01/05/2024, 3:47 PM

## 2024-01-05 NOTE — Progress Notes (Signed)
 Physical Therapy Treatment Patient Details Name: Alexis Braun MRN: [id number] DOB: 1950/03/01 Today's Date: 01/05/2024   History of Present Illness Pt s/p R THR and with hx of CAD, congenital hearing loss and NSVT    PT Comments  Pt continues very cooperative but reports increased pain this am.  Therex program initiated and pt up to ambulate limited distance in hall before positioning in chair with ice packs in place as breakfast is delivered.    If plan is discharge home, recommend the following: A little help with bathing/dressing/bathroom;Assistance with cooking/housework;Help with stairs or ramp for entrance;Assist for transportation;A little help with walking and/or transfers   Can travel by private vehicle     No  Equipment Recommendations  Rolling walker (2 wheels)    Recommendations for Other Services       Precautions / Restrictions Precautions Precautions: Fall Restrictions Weight Bearing Restrictions Per Provider Order: Yes RLE Weight Bearing Per Provider Order: Weight bearing as tolerated     Mobility  Bed Mobility Overal bed mobility: Needs Assistance Bed Mobility: Supine to Sit     Supine to sit: Min assist     General bed mobility comments: cues for sequence and use of L LE to self assist; min assist to manage R LE 2* increased pain this am    Transfers Overall transfer level: Needs assistance Equipment used: Rolling walker (2 wheels) Transfers: Sit to/from Stand Sit to Stand: Contact guard assist           General transfer comment: cues for LE management and use of UEs to self assist    Ambulation/Gait Ambulation/Gait assistance: Contact guard assist Gait Distance (Feet): 48 Feet Assistive device: Rolling walker (2 wheels) Gait Pattern/deviations: Step-to pattern, Decreased step length - right, Decreased step length - left, Shuffle, Trunk flexed Gait velocity: decr     General Gait Details: min cues for sequence, posture and position  from RW; distance pain limited   Stairs             Wheelchair Mobility     Tilt Bed    Modified Rankin (Stroke Patients Only)       Balance Overall balance assessment: Needs assistance Sitting-balance support: No upper extremity supported, Feet supported Sitting balance-Leahy Scale: Good     Standing balance support: Single extremity supported Standing balance-Leahy Scale: Poor                              Communication Communication Communication: Impaired Factors Affecting Communication: Hearing impaired  Cognition Arousal: Alert Behavior During Therapy: WFL for tasks assessed/performed   PT - Cognitive impairments: No apparent impairments                         Following commands: Intact      Cueing Cueing Techniques: Verbal cues  Exercises Total Joint Exercises Ankle Circles/Pumps: AROM, Both, 15 reps, Supine Quad Sets: AROM, Both, 10 reps, Supine Heel Slides: AAROM, Right, 20 reps, Supine Hip ABduction/ADduction: AAROM, Right, 15 reps, Supine    General Comments        Pertinent Vitals/Pain Pain Assessment Pain Assessment: 0-10 Pain Score: 6  Pain Location: R hip and thigh Pain Descriptors / Indicators: Aching, Burning, Sore, Grimacing Pain Intervention(s): Limited activity within patient's tolerance, Monitored during session, Premedicated before session, Ice applied    Home Living  Prior Function            PT Goals (current goals can now be found in the care plan section) Acute Rehab PT Goals Patient Stated Goal: Regain IND PT Goal Formulation: With patient Time For Goal Achievement: 01/10/24 Potential to Achieve Goals: Good Progress towards PT goals: Progressing toward goals    Frequency    7X/week      PT Plan      Co-evaluation              AM-PAC PT 6 Clicks Mobility   Outcome Measure  Help needed turning from your back to your side while in a flat  bed without using bedrails?: A Little Help needed moving from lying on your back to sitting on the side of a flat bed without using bedrails?: A Little Help needed moving to and from a bed to a chair (including a wheelchair)?: A Little Help needed standing up from a chair using your arms (e.g., wheelchair or bedside chair)?: A Little Help needed to walk in hospital room?: A Little Help needed climbing 3-5 steps with a railing? : A Lot 6 Click Score: 17    End of Session Equipment Utilized During Treatment: Gait belt Activity Tolerance: Patient tolerated treatment well;Patient limited by pain Patient left: in chair;with call bell/phone within reach;with chair alarm set Nurse Communication: Mobility status PT Visit Diagnosis: Difficulty in walking, not elsewhere classified (R26.2);Pain Pain - Right/Left: Right Pain - part of body: Hip     Time: 9160-9093 PT Time Calculation (min) (ACUTE ONLY): 27 min  Charges:    $Gait Training: 8-22 mins $Therapeutic Exercise: 8-22 mins PT General Charges $$ ACUTE PT VISIT: 1 Visit                     [facility] PT Acute Rehabilitation Services Office [phone]    Shequila Neglia 01/05/2024, 9:44 AM

## 2024-01-05 NOTE — Progress Notes (Signed)
" °   01/05/24 2226  BiPAP/CPAP/SIPAP  BiPAP/CPAP/SIPAP Pt Type Adult  BiPAP/CPAP/SIPAP Resmed  Mask Type Nasal mask  Dentures removed? Not applicable  FiO2 (%) 21 %  Patient Home Machine No  Patient Home Mask Yes  Patient Home Tubing No  Auto Titrate Yes  Minimum cmH2O 5 cmH2O  Maximum cmH2O 15 cmH2O  Device Plugged into RED Power Outlet Yes  BiPAP/CPAP /SiPAP Vitals  Temp 98.6 F (37 C)  Pulse Rate 73  Resp 18  BP (!) 142/57  SpO2 97 %  MEWS Score/Color  MEWS Score 0  MEWS Score Color Green    "

## 2024-01-05 NOTE — Progress Notes (Signed)
 Subjective: 2 Days Post-Op Procedures (LRB): ARTHROPLASTY, HIP, TOTAL, ANTERIOR APPROACH (Right) Patient reports pain as moderate.    Objective: Vital signs in last 24 hours: Temp:  [97.5 F (36.4 C)-98.6 F (37 C)] 98.6 F (37 C) (12/21 0549) Pulse Rate:  [58-62] 58 (12/21 0549) Resp:  [15-18] 18 (12/21 0549) BP: (109-118)/(54-75) 118/75 (12/21 0549) SpO2:  [98 %-100 %] 98 % (12/21 0549) FiO2 (%):  [21 %] 21 % (12/20 1956)  Intake/Output from previous day: 12/20 0701 - 12/21 0700 In: 2447.6 [P.O.:600; I.V.:1847.6] Out: 700 [Urine:700] Intake/Output this shift: Total I/O In: 120 [P.O.:120] Out: -   Recent Labs    01/04/24 0637  HGB 10.9*   Recent Labs    01/04/24 0637  WBC 11.0*  RBC 3.46*  HCT 31.8*  PLT 224   Recent Labs    01/04/24 0637  NA 139  K 4.2  CL 105  CO2 26  BUN 15  CREATININE 0.66  GLUCOSE 116*  CALCIUM  9.4   No results for input(s): LABPT, INR in the last 72 hours.  Neurologically intact Neurovascular intact Sensation intact distally Intact pulses distally Dorsiflexion/Plantar flexion intact Incision: dressing C/D/I No cellulitis present Compartment soft   Assessment/Plan: 2 Days Post-Op Procedures (LRB): ARTHROPLASTY, HIP, TOTAL, ANTERIOR APPROACH (Right) Advance diet Up with therapy D/C IV fluids Discharge to SNF likely early this week WBAT RLE      Ronal LITTIE Grave 01/05/2024, 12:03 PM

## 2024-01-05 NOTE — Plan of Care (Signed)
 ?  Problem: Clinical Measurements: ?Goal: Ability to maintain clinical measurements within normal limits will improve ?Outcome: Progressing ?Goal: Will remain free from infection ?Outcome: Progressing ?Goal: Diagnostic test results will improve ?Outcome: Progressing ?  ?

## 2024-01-06 ENCOUNTER — Encounter (HOSPITAL_COMMUNITY): Payer: Self-pay | Admitting: Orthopaedic Surgery

## 2024-01-06 MED ORDER — OXYCODONE HCL 5 MG PO TABS: 5.0000 mg | ORAL_TABLET | Freq: Four times a day (QID) | ORAL | 0 refills | Status: DC | PRN

## 2024-01-06 MED ORDER — ASPIRIN 81 MG PO CHEW: 81.0000 mg | CHEWABLE_TABLET | Freq: Two times a day (BID) | ORAL | 0 refills | Status: DC

## 2024-01-06 NOTE — Progress Notes (Signed)
 Patient ID: Alexis Braun, female   DOB: 1950-12-20, 73 y.o.   MRN: [id number] After corresponding with PT and the transitional care team, it sounds like the patient will be able to go home in the next day or 2 when she does establish some assistance at home.  HHPT and OT have been ordered.  I did speak to the patient about this and will see about having her discharge to home tomorrow afternoon versus Wednesday at the latest.  I did put in an order for OT for here.

## 2024-01-06 NOTE — Progress Notes (Signed)
 Patient ID: Alexis Braun, female   DOB: [birth year], 73 y.o.   MRN: [id number] The patient is awake and alert.  Her vital signs are stable.  Her right operative hip is stable.  A consult has been put into the transitional care team due to look into short-term skilled nursing placement.  She is medically stable for discharge to skilled nursing once a bed is available.

## 2024-01-06 NOTE — Plan of Care (Signed)
  Problem: Education: Goal: Knowledge of General Education information will improve Description: Including pain rating scale, medication(s)/side effects and non-pharmacologic comfort measures Outcome: Progressing   Problem: Health Behavior/Discharge Planning: Goal: Ability to manage health-related needs will improve Outcome: Progressing   Problem: Clinical Measurements: Goal: Ability to maintain clinical measurements within normal limits will improve Outcome: Progressing Goal: Will remain free from infection Outcome: Progressing Goal: Diagnostic test results will improve Outcome: Progressing Goal: Respiratory complications will improve Outcome: Progressing Goal: Cardiovascular complication will be avoided Outcome: Progressing   Problem: Activity: Goal: Risk for activity intolerance will decrease Outcome: Progressing   Problem: Nutrition: Goal: Adequate nutrition will be maintained Outcome: Progressing   Problem: Coping: Goal: Level of anxiety will decrease Outcome: Progressing   Problem: Elimination: Goal: Will not experience complications related to bowel motility Outcome: Progressing Goal: Will not experience complications related to urinary retention Outcome: Progressing   Problem: Pain Managment: Goal: General experience of comfort will improve and/or be controlled Outcome: Progressing   Problem: Safety: Goal: Ability to remain free from injury will improve Outcome: Progressing   Problem: Skin Integrity: Goal: Risk for impaired skin integrity will decrease Outcome: Progressing   Problem: Education: Goal: Knowledge of the prescribed therapeutic regimen will improve Outcome: Progressing Goal: Understanding of discharge needs will improve Outcome: Progressing Goal: Individualized Educational Video(s) Outcome: Progressing   Problem: Activity: Goal: Ability to avoid complications of mobility impairment will improve Outcome: Progressing Goal: Ability to  tolerate increased activity will improve Outcome: Progressing   Problem: Clinical Measurements: Goal: Postoperative complications will be avoided or minimized Outcome: Progressing   Problem: Pain Management: Goal: Pain level will decrease with appropriate interventions Outcome: Progressing   Problem: Skin Integrity: Goal: Will show signs of wound healing Outcome: Progressing   Problem: Education: Goal: Knowledge of the prescribed therapeutic regimen will improve Outcome: Progressing Goal: Understanding of discharge needs will improve Outcome: Progressing Goal: Individualized Educational Video(s) Outcome: Progressing   Problem: Activity: Goal: Ability to avoid complications of mobility impairment will improve Outcome: Progressing Goal: Ability to tolerate increased activity will improve Outcome: Progressing   Problem: Clinical Measurements: Goal: Postoperative complications will be avoided or minimized Outcome: Progressing   Problem: Pain Management: Goal: Pain level will decrease with appropriate interventions Outcome: Progressing   Problem: Skin Integrity: Goal: Will show signs of wound healing Outcome: Progressing

## 2024-01-06 NOTE — TOC Progression Note (Signed)
 Transition of Care [facility]) - Progression Note    Patient Details  Name: Alexis Braun MRN: [id number] Date of Birth: Aug 05, 1950  Transition of [facility]) CM/SW Contact  Alfonse JONELLE Rex, RN Phone Number: 01/06/2024, 10:54 AM  Clinical Narrative:   Admitted 01/03/24 for scheduled R THA, dc therapy plan HH PT ([facility]) vs SNF. Teams chat received from PT, patient progressing well leaning towards home with [facility] PT. NCM spoke with patient, patient confirmed agreeable to home with [facility] PT ( already arranged with [facility] ), patient reports she is arranging private home caregiver services. No Further INPT CM needs identified at this time.       Barriers to Discharge: No Barriers Identified               Expected Discharge Plan and Services                                               Social Drivers of Health (SDOH) Interventions SDOH Screenings   Food Insecurity: No Food Insecurity (01/03/2024)  Housing: Low Risk (01/03/2024)  Transportation Needs: No Transportation Needs (01/03/2024)  Utilities: Not At Risk (01/03/2024)  Depression (PHQ2-9): Low Risk (02/04/2023)  Social Connections: Moderately Integrated (01/03/2024)  Tobacco Use: Low Risk (01/03/2024)    Readmission Risk Interventions    01/06/2024   10:53 AM  Readmission Risk Prevention Plan  Post Dischage Appt Complete  Medication Screening Complete  Transportation Screening Complete

## 2024-01-06 NOTE — Progress Notes (Signed)
 Physical Therapy Treatment Patient Details Name: Alexis Braun MRN: [id number] DOB: 05-May-1950 Today's Date: 01/06/2024   History of Present Illness Pt s/p R THR and with hx of CAD, congenital hearing loss and NSVT    PT Comments  POD # 3 pm session  AxO x 3 slightly off but she did just wake up from a nap and requiring repeat instructions.  Pt presents with mild anxiety about going home alone.  She is progressing well.  Ortho MD agreeing to [facility] servives.  Pt will need a RW.   Assisted OOB  to amb to the bathroom then in hallway.  Then returned to room to perform some TE's following HEP handout.  Instructed on proper tech, freq as well as use of ICE.    Committed to D/C to home tomorrow afternoon Tuesday between 2 - 3 pm allow enough time for Pt to secure a ride home and TOC to arrange [facility] services/walker.   If plan is discharge home, recommend the following: A little help with bathing/dressing/bathroom;Help with stairs or ramp for entrance;Assist for transportation   Can travel by private vehicle     No  Equipment Recommendations  Rolling walker (2 wheels)    Recommendations for Other Services       Precautions / Restrictions Precautions Precautions: Fall Restrictions Weight Bearing Restrictions Per Provider Order: No RLE Weight Bearing Per Provider Order: Weight bearing as tolerated     Mobility  Bed Mobility Overal bed mobility: Needs Assistance Bed Mobility: Supine to Sit, Sit to Supine     Supine to sit: Supervision Sit to supine: Supervision   General bed mobility comments: Pt self able using her leg lifter    Transfers Overall transfer level: Needs assistance Equipment used: Rolling walker (2 wheels) Transfers: Sit to/from Stand Sit to Stand: Supervision, Modified independent (Device/Increase time)           General transfer comment: Pt self able from all bed and toilet with good use of hands to steady self.  Also self able to perform peri care  standing.    Ambulation/Gait Ambulation/Gait assistance: Supervision, Modified independent (Device/Increase time) Gait Distance (Feet): 115 Feet Assistive device: Rolling walker (2 wheels) Gait Pattern/deviations: Step-through pattern, Decreased step length - right, Decreased step length - left Gait velocity: decr     General Gait Details: Pt self able to amb a functional distance with good cognition on walker use.   Stairs Stairs:  (perfromed well with stairs this morning)           Wheelchair Mobility     Tilt Bed    Modified Rankin (Stroke Patients Only)       Balance                                            Communication    Cognition Arousal: Alert Behavior During Therapy: WFL for tasks assessed/performed                           PT - Cognition Comments: AxO x 3 slightly off and requiring repeat instructions.  Pt presents with mild anxiety about going home alone.  She is progressing well.  Committed to an afternoon D/C Tuesday between 2 - 3 pm enough time for Pt to secure a ride home and TOC to arrange [facility] services. Following commands: Intact  Cueing    Exercises Total Hip Replacement TE's following HEP Handout 10 reps ankle pumps 05 reps knee presses 05 reps heel slides 05 reps SAQ's 05 reps ABD Instructed how to use a belt loop to assist  Followed by ICE      General Comments        Pertinent Vitals/Pain Pain Assessment Pain Assessment: 0-10 Pain Score: 5  Pain Location: R hip and thigh Pain Descriptors / Indicators: Aching, Sore, Grimacing Pain Intervention(s): Monitored during session, Premedicated before session, Repositioned, Ice applied    Home Living                          Prior Function            PT Goals (current goals can now be found in the care plan section) Progress towards PT goals: Progressing toward goals    Frequency    7X/week      PT Plan       Co-evaluation              AM-PAC PT 6 Clicks Mobility   Outcome Measure  Help needed turning from your back to your side while in a flat bed without using bedrails?: None Help needed moving from lying on your back to sitting on the side of a flat bed without using bedrails?: None Help needed moving to and from a bed to a chair (including a wheelchair)?: None Help needed standing up from a chair using your arms (e.g., wheelchair or bedside chair)?: None Help needed to walk in hospital room?: None Help needed climbing 3-5 steps with a railing? : A Little 6 Click Score: 23    End of Session Equipment Utilized During Treatment: Gait belt Activity Tolerance: Patient tolerated treatment well Patient left: in bed;with call bell/phone within reach;with bed alarm set Nurse Communication: Mobility status PT Visit Diagnosis: Difficulty in walking, not elsewhere classified (R26.2);Pain Pain - Right/Left: Right Pain - part of body: Hip     Time: 8390-8362 PT Time Calculation (min) (ACUTE ONLY): 28 min  Charges:    $Gait Training: 8-22 mins $Therapeutic Activity: 8-22 mins PT General Charges $$ ACUTE PT VISIT: 1 Visit                     Katheryn Leap  PTA Acute  Rehabilitation Services Office M-F          [phone]

## 2024-01-06 NOTE — Progress Notes (Signed)
 Physical Therapy Treatment Patient Details Name: Alexis Braun MRN: [id number] DOB: [birth year] Today's Date: 01/06/2024   History of Present Illness Pt s/p R THR and with hx of CAD, congenital hearing loss and NSVT    PT Comments  Pt progressing very well, meeting goals. Pt states she may be able to hire a caregiver for home, she is considering home with HHPT. Will update MD and TOC, continue to follow.   If plan is discharge home, recommend the following: A little help with bathing/dressing/bathroom;Help with stairs or ramp for entrance;Assist for transportation   Can travel by private vehicle        Equipment Recommendations  Rolling walker (2 wheels)    Recommendations for Other Services       Precautions / Restrictions Precautions Precautions: Fall Recall of Precautions/Restrictions: Intact Restrictions RLE Weight Bearing Per Provider Order: Weight bearing as tolerated     Mobility  Bed Mobility Overal bed mobility: Needs Assistance Bed Mobility: Supine to Sit, Sit to Supine     Supine to sit: Modified independent (Device/Increase time), HOB elevated Sit to supine: Modified independent (Device/Increase time)   General bed mobility comments: use of leg lifter; pt has adjustable bed at home    Transfers Overall transfer level: Needs assistance Equipment used: Rolling walker (2 wheels) Transfers: Sit to/from Stand Sit to Stand: Supervision, Modified independent (Device/Increase time)           General transfer comment: ongoing education for LE management and use of UEs to self assist    Ambulation/Gait Ambulation/Gait assistance: Supervision, Modified independent (Device/Increase time) Gait Distance (Feet): 100 Feet Assistive device: Rolling walker (2 wheels) Gait Pattern/deviations: Step-through pattern, Decreased step length - right, Decreased step length - left Gait velocity: decr     General Gait Details: intermittent cues for RW position,  beginning step through pattern; no incr oain, steady, no LOB   Stairs Stairs: Yes Stairs assistance: Supervision Stair Management: One rail Right, One rail Left, Step to pattern, Sideways Number of Stairs: 3 General stair comments: cues for sequence and technique, steady, no LOB   Wheelchair Mobility     Tilt Bed    Modified Rankin (Stroke Patients Only)       Balance   Sitting-balance support: No upper extremity supported, Feet supported Sitting balance-Leahy Scale: Good     Standing balance support: During functional activity, Reliant on assistive device for balance, No upper extremity supported Standing balance-Leahy Scale: Fair                              Hotel Manager: Impaired Factors Affecting Communication: Hearing impaired  Cognition Arousal: Alert Behavior During Therapy: WFL for tasks assessed/performed   PT - Cognitive impairments: No apparent impairments                         Following commands: Intact      Cueing Cueing Techniques: Verbal cues  Exercises      General Comments        Pertinent Vitals/Pain Pain Assessment Pain Assessment: 0-10 Pain Score: 4  Pain Location: R hip and thigh Pain Descriptors / Indicators: Aching, Burning, Sore, Grimacing Pain Intervention(s): Limited activity within patient's tolerance, Monitored during session, Premedicated before session, Repositioned, Ice applied    Home Living  Prior Function            PT Goals (current goals can now be found in the care plan section) Acute Rehab PT Goals Patient Stated Goal: Regain IND PT Goal Formulation: With patient Time For Goal Achievement: 01/10/24 Potential to Achieve Goals: Good Progress towards PT goals: Progressing toward goals    Frequency    7X/week      PT Plan      Co-evaluation              AM-PAC PT 6 Clicks Mobility   Outcome Measure   Help needed turning from your back to your side while in a flat bed without using bedrails?: None Help needed moving from lying on your back to sitting on the side of a flat bed without using bedrails?: None Help needed moving to and from a bed to a chair (including a wheelchair)?: None Help needed standing up from a chair using your arms (e.g., wheelchair or bedside chair)?: None Help needed to walk in hospital room?: A Little Help needed climbing 3-5 steps with a railing? : A Little 6 Click Score: 22    End of Session Equipment Utilized During Treatment: Gait belt Activity Tolerance: Patient tolerated treatment well Patient left: in bed;with call bell/phone within reach;with bed alarm set Nurse Communication: Mobility status PT Visit Diagnosis: Difficulty in walking, not elsewhere classified (R26.2);Pain Pain - Right/Left: Right Pain - part of body: Hip     Time: 9046-8985 PT Time Calculation (min) (ACUTE ONLY): 21 min  Charges:    $Gait Training: 8-22 mins PT General Charges $$ ACUTE PT VISIT: 1 Visit                     Nalia Honeycutt, PT  Acute Rehab Dept [facility]) [phone]  01/06/2024    [facility] 01/06/2024, 10:24 AM

## 2024-01-06 NOTE — Progress Notes (Signed)
" °   01/06/24 2201  BiPAP/CPAP/SIPAP  BiPAP/CPAP/SIPAP Pt Type Adult  BiPAP/CPAP/SIPAP Resmed  Mask Type Nasal mask  Dentures removed? Not applicable  FiO2 (%) 21 %  Patient Home Machine No  Patient Home Mask Yes  Patient Home Tubing No  Auto Titrate Yes  Minimum cmH2O 5 cmH2O  Maximum cmH2O 15 cmH2O  Device Plugged into RED Power Outlet Yes    "

## 2024-01-07 ENCOUNTER — Other Ambulatory Visit (HOSPITAL_COMMUNITY): Payer: Self-pay

## 2024-01-07 MED ORDER — OXYCODONE HCL 5 MG PO TABS
5.0000 mg | ORAL_TABLET | Freq: Four times a day (QID) | ORAL | 0 refills | Status: AC | PRN
  Filled 2024-01-07 (×2): qty 30, 4d supply, fill #0

## 2024-01-07 MED ORDER — ASPIRIN 81 MG PO CHEW
81.0000 mg | CHEWABLE_TABLET | Freq: Two times a day (BID) | ORAL | 0 refills | Status: AC
  Filled 2024-01-07: qty 30, 15d supply, fill #0

## 2024-01-07 NOTE — Progress Notes (Signed)
 Physical Therapy Treatment Patient Details Name: Alexis Braun MRN: [id number] DOB: 08/01/1950 Today's Date: 01/07/2024   History of Present Illness Pt s/p R THR and with hx of CAD, congenital hearing loss and NSVT    PT Comments  POD # 4 Pt has met her mobility goals to D/C to home today.    Pt self able to transfer and is amb a functional distance.  Then returned to room to perform some TE's following HEP handout.  Instructed on proper tech, freq as well as use of ICE.   Addressed all mobility questions, discussed appropriate activity, educated on use of ICE.  Pt ready for D/C to home.    If plan is discharge home, recommend the following:     Can travel by private vehicle     No  Equipment Recommendations  Rolling walker (2 wheels)    Recommendations for Other Services       Precautions / Restrictions Precautions Precautions: Fall Recall of Precautions/Restrictions: Intact Restrictions Weight Bearing Restrictions Per Provider Order: No RLE Weight Bearing Per Provider Order: Weight bearing as tolerated     Mobility  Bed Mobility               General bed mobility comments: OOB in recliner    Transfers Overall transfer level: Needs assistance Equipment used: Rolling walker (2 wheels) Transfers: Sit to/from Stand, Bed to chair/wheelchair/BSC Sit to Stand: Modified independent (Device/Increase time)           General transfer comment: Pt self able to rise from recliner as well as on/off toilet.  Pt moving slow but is steady.  Also self able to perform peri care after void .  Good safety cognition and use of hands to steady self.    Ambulation/Gait Ambulation/Gait assistance: Supervision Gait Distance (Feet): 120 Feet Assistive device: Rolling walker (2 wheels) Gait Pattern/deviations: Step-through pattern, Decreased step length - right, Decreased step length - left Gait velocity: decr     General Gait Details: Pt self able to amb a functional  distance with good cognition on walker use.   Stairs             Wheelchair Mobility     Tilt Bed    Modified Rankin (Stroke Patients Only)       Balance                                            Communication Communication Communication: Impaired Factors Affecting Communication: Hearing impaired  Cognition Arousal: Alert                             PT - Cognition Comments: AxO x 3 pleasant and ready to D/C to home later today.  Pt arranging a ride home.  Walker was delivered.  HH PT arranged. Following commands: Intact      Cueing Cueing Techniques: Verbal cues  Exercises  Total Hip Replacement TE's following HEP Handout 10 reps ankle pumps 05 reps knee presses 05 reps heel slides 05 reps SAQ's 05 reps ABD 05 reps all standing TE's Instructed how to use a belt loop to assist  Followed by ICE     General Comments General comments (skin integrity, edema, etc.): very low pain reported during ADL's and bathroom mobility, nursing had just removed TEDS this am thus left off,  R anterior hip incision dressing intact and no edema noed in R LE      Pertinent Vitals/Pain Pain Assessment Pain Assessment: Faces Faces Pain Scale: Hurts little more Pain Location: R hip with activity Pain Descriptors / Indicators: Aching, Sore, Operative site guarding Pain Intervention(s): Monitored during session, Premedicated before session, Repositioned, Ice applied    Home Living Family/patient expects to be discharged to:: Private residence Living Arrangements: Alone Available Help at Discharge: Friend(s);Available PRN/intermittently Type of Home: House Home Access: Stairs to enter Entrance Stairs-Rails: Right Entrance Stairs-Number of Steps: 8   Home Layout: One level Home Equipment: Cane - single point;Shower seat;Adaptive equipment;Hand held shower head      Prior Function            PT Goals (current goals can now be found in  the care plan section) Progress towards PT goals: Progressing toward goals    Frequency    7X/week      PT Plan      Co-evaluation              AM-PAC PT 6 Clicks Mobility   Outcome Measure  Help needed turning from your back to your side while in a flat bed without using bedrails?: None Help needed moving from lying on your back to sitting on the side of a flat bed without using bedrails?: None Help needed moving to and from a bed to a chair (including a wheelchair)?: None Help needed standing up from a chair using your arms (e.g., wheelchair or bedside chair)?: None Help needed to walk in hospital room?: A Little Help needed climbing 3-5 steps with a railing? : A Little 6 Click Score: 22    End of Session Equipment Utilized During Treatment: Gait belt Activity Tolerance: Patient tolerated treatment well Patient left: in chair;with call bell/phone within reach Nurse Communication: Mobility status PT Visit Diagnosis: Difficulty in walking, not elsewhere classified (R26.2);Pain Pain - Right/Left: Right Pain - part of body: Hip     Time: 8883-8857 PT Time Calculation (min) (ACUTE ONLY): 26 min  Charges:    $Gait Training: 8-22 mins $Therapeutic Exercise: 8-22 mins PT General Charges $$ ACUTE PT VISIT: 1 Visit                     Katheryn Leap  PTA Acute  Rehabilitation Services Office M-F          [phone]

## 2024-01-07 NOTE — Discharge Summary (Signed)
 " Patient ID: Alexis Braun MRN: [id number] DOB/AGE: 02/26/1950 73 y.o.  Admit date: 01/03/2024 Discharge date: 01/07/2024  Admission Diagnoses:  Principal Problem:   Unilateral primary osteoarthritis, right hip Active Problems:   Status post total replacement of right hip   Discharge Diagnoses:  Same  Past Medical History:  Diagnosis Date   Allergy    sulfa   Anxiety    Arthritis    Bladder prolapse, female, acquired    CAD (coronary artery disease), native coronary artery 12/2022   coronary calcium  score of 54.9 with 25 to 49% mid LAD stenosis   Chest pain    per pt, over couple years   Congenital hearing loss    right   Gallbladder polyp 2014   GERD (gastroesophageal reflux disease)    Hepatitis    as teen-iv drugs   Hx of adenomatous and sessile serrated colonic polyps 12/24/2017   Hypertension    on medication   Hypoglycemia    Internal hemorrhoid    Migraines    NSVT (nonsustained ventricular tachycardia) (HCC)    noted on ziopatch 06/2023 lasting 13 beats   Ovarian cyst 04/03/1999   PONV (postoperative nausea and vomiting)    Positive hepatitis C antibody test    at age 42; has been told that she self cured No current or past treatment.   Sleep apnea    uses c-pap   SVT (supraventricular tachycardia)    noted on Ziopatch 06/2023 lasting 11 beats   Wears hearing aid    right    Surgeries: Procedures: ARTHROPLASTY, HIP, TOTAL, ANTERIOR APPROACH on 01/03/2024   Consultants:   Discharged Condition: Improved  Hospital Course: Evalee Gerard is an 73 y.o. female who was admitted 01/03/2024 for operative treatment ofUnilateral primary osteoarthritis, right hip. Patient has severe unremitting pain that affects sleep, daily activities, and work/hobbies. After pre-op clearance the patient was taken to the operating room on 01/03/2024 and underwent  Procedures: ARTHROPLASTY, HIP, TOTAL, ANTERIOR APPROACH.    Patient was given perioperative antibiotics:   Anti-infectives (From admission, onward)    Start     Dose/Rate Route Frequency Ordered Stop   01/03/24 1300  ceFAZolin  (ANCEF ) IVPB 2g/100 mL premix        2 g 200 mL/hr over 30 Minutes Intravenous Every 6 hours 01/03/24 1057 01/03/24 1936   01/03/24 0630  ceFAZolin  (ANCEF ) IVPB 2g/100 mL premix        2 g 200 mL/hr over 30 Minutes Intravenous On call to O.R. 01/03/24 0617 01/03/24 9277        Patient was given sequential compression devices, early ambulation, and chemoprophylaxis to prevent DVT.  Inpatient Morphine  Milligram Equivalents Per Day 12/19 - 12/23   Values displayed are in units of MME/Day    Order Start / End Date 12/19 12/20 12/21  Yesterday Today    fentaNYL  (SUBLIMAZE ) injection 25-50 mcg 12/19 - 12/19 0 of 45-90 -- -- -- --    fentaNYL  (SUBLIMAZE ) injection 12/19 - 12/19 *30 of 30 -- -- -- --    HYDROmorphone  (DILAUDID ) injection 0.5-1 mg 12/19 - No end date 10 of 40-80 0 of 60-120 40 of 60-120 0 of 60-120 0 of 60-120    oxyCODONE  (Oxy IR/ROXICODONE ) immediate release tablet 5-10 mg 12/19 - No end date 22.5 of 30-60 30 of 45-90 22.5 of 45-90 30 of 45-90 7.5 of 45-90    oxyCODONE  (Oxy IR/ROXICODONE ) immediate release tablet 10-15 mg 12/19 - No end date 15 of 60-90 15  of 90-135 7.5 of 90-135 0 of 90-135 0 of 90-135    traMADol  (ULTRAM ) tablet 100 mg 12/19 - No end date 10 of 20 20 of 40 0 of 40 0 of 40 10 of 40    Daily Totals  * 87.5 of 225-370 65 of 235-385 70 of 235-385 30 of 235-385 17.5 of 235-385  *One-Step medication     Patient benefited maximally from hospital stay and there were no complications.    Recent vital signs: Patient Vitals for the past 24 hrs:  BP Temp Temp src Pulse Resp SpO2  01/07/24 0614 125/69 97.6 F (36.4 C) Oral 60 15 99 %  01/06/24 2222 122/62 -- -- 63 14 97 %  01/06/24 1854 119/60 98.9 F (37.2 C) Oral 69 16 98 %     Recent laboratory studies: No results for input(s): WBC, HGB, HCT, PLT, NA, K, CL, CO2, BUN,  CREATININE, GLUCOSE, INR, CALCIUM  in the last 72 hours.  Invalid input(s): PT, 2   Discharge Medications:   Allergies as of 01/07/2024       Reactions   Sulfa Antibiotics Other (See Comments)   Reaction happened during childhood- not recalled        Medication List     STOP taking these medications    aspirin  EC 81 MG tablet Replaced by: aspirin  81 MG chewable tablet       TAKE these medications    ALPRAZolam  0.5 MG tablet Commonly known as: XANAX  Take 0.5 mg by mouth daily as needed for anxiety.   aspirin  81 MG chewable tablet Chew 1 tablet (81 mg total) by mouth 2 (two) times daily. Replaces: aspirin  EC 81 MG tablet   atenolol  25 MG tablet Commonly known as: TENORMIN  Take 1 tablet (25 mg total) by mouth daily. What changed: when to take this   cyclobenzaprine  10 MG tablet Commonly known as: FLEXERIL  Take 10 mg by mouth 3 (three) times daily as needed for muscle spasms.   estradiol  0.1 MG/GM vaginal cream Commonly known as: ESTRACE  NEW INSTRUCTIONS:  INSERT 0.5 GRAM VAGINALLY 3 TIMES A WEEK AS NEEDED FOR SYMPTOM RELIEF. What changed:  how much to take how to take this when to take this additional instructions   famotidine  20 MG tablet Commonly known as: PEPCID  Take 20 mg by mouth daily as needed for heartburn or indigestion.   Fish Oil 1000 MG Caps Take 1,000 mg by mouth daily.   meloxicam 15 MG tablet Commonly known as: MOBIC Take 15 mg by mouth daily as needed for pain.   Optase TTO Cleansing Wipes Pads Apply 1 application  topically daily as needed.   oxyCODONE  5 MG immediate release tablet Commonly known as: Oxy IR/ROXICODONE  Take 1-2 tablets (5-10 mg total) by mouth every 6 (six) hours as needed for moderate pain (pain score 4-6) (pain score 4-6).   progesterone  100 MG capsule Commonly known as: PROMETRIUM  Take 100 mg by mouth at bedtime.   psyllium 0.52 g capsule Commonly known as: REGULOID Take 0.52 g by mouth daily as  needed (to promote bowel regularity).   Refresh Optive Mega-3 0.5-1-0.5 % Soln Generic drug: Carboxymeth-Glyc-Polysorb PF Place 1 drop into both eyes 4 (four) times daily as needed (for dryness).   rosuvastatin  5 MG tablet Commonly known as: CRESTOR  Take 5 mg by mouth See admin instructions. Take 5 mg by mouth at bedtime on Tues/Thurs/Sat   SUMAtriptan  100 MG tablet Commonly known as: IMITREX  Take 100 mg by mouth daily as needed  for migraine (and may repeat once in 2 hours if headache persists or recurs).   traZODone 50 MG tablet Commonly known as: DESYREL Take 25 mg by mouth at bedtime as needed for sleep.   Tryptyr  0.003 % Soln Generic drug: Acoltremon  Place 1 drop into both eyes in the morning and at bedtime.   valACYclovir  500 MG tablet Commonly known as: VALTREX  Take 1 tablet (500 mg total) by mouth daily.               Durable Medical Equipment  (From admission, onward)           Start     Ordered   01/03/24 1058  DME 3 n 1  Once        01/03/24 1057   01/03/24 1058  DME Walker rolling  Once       Question Answer Comment  Walker: With 5 Inch Wheels   Patient needs a walker to treat with the following condition Status post total replacement of right hip      01/03/24 1057            Diagnostic Studies: DG Pelvis Portable Result Date: 01/03/2024 CLINICAL DATA:  Postop in PACU. EXAM: DG PORTABLE PELVIS COMPARISON:  None Available. FINDINGS: Right hip arthroplasty in expected alignment. No periprosthetic lucency or fracture. Recent postsurgical change includes air and edema in the soft tissues. Overlying skin staples in place. IMPRESSION: Right hip arthroplasty without immediate postoperative complication. Electronically Signed   By: Andrea Gasman M.D.   On: 01/03/2024 11:26   DG HIP UNILAT WITH PELVIS 1V RIGHT Result Date: 01/03/2024 CLINICAL DATA:  Right hip replacement. EXAM: DG HIP (WITH OR WITHOUT PELVIS) 1V RIGHT COMPARISON:  None Available.  FINDINGS: Three fluoroscopic spot views of the pelvis and right hip obtained in the operating room. Images during hip arthroplasty. Fluoroscopy time 15 seconds. Dose 2.0358 mGy. IMPRESSION: Intraoperative fluoroscopy during right hip arthroplasty. Electronically Signed   By: Andrea Gasman M.D.   On: 01/03/2024 11:25   DG C-Arm 1-60 Min-No Report Result Date: 01/03/2024 Fluoroscopy was utilized by the requesting physician.  No radiographic interpretation.   XR Knee Complete 4 Views Right Result Date: 12/10/2023 4 views of the right knee including standing AP, Rosenberg, lateral and sunrise views was ordered and reviewed by myself today.  There is at moderate+ medial tibiofemoral joint space narrowing with arthritic change as well as mild patellofemoral arthralgia.  Lateral joint line is well-preserved.  No acute fracture noted.  No soft tissue abnormality.  XR Shoulder Right Result Date: 12/10/2023 4 view x-ray of the right shoulder including AP, Grashey, scapular Y and axial view was ordered and reviewed by myself today.  There is only minimal arthritic change within the glenohumeral joint.  There is moderate AC joint narrowing.  There is sclerosis with spurring off the greater tuberosity of the humeral head, likely indicative of impingement.  There is undersurface spurring of the acromion.  No acute fracture noted.   Disposition: Discharge disposition: 01-Home or Self Care          Follow-up Information     Vernetta Lonni GRADE, MD Follow up in 2 week(s).   Specialty: Orthopedic Surgery Contact information: [address] [place] [address] [phone]         [facility] Follow up.   Why: Home Health Physical Therapy/Occupational Therapy Contact information: [address] [place], [place] [address] Phone Number [phone]  Signed: Lonni CINDERELLA Poli 01/07/2024, 12:47 PM    "

## 2024-01-07 NOTE — Progress Notes (Signed)
 Patient ID: Alexis Braun, female   DOB: Mar 01, 1950, 73 y.o.   MRN: [id number] The patient is doing well overall.  Her right operative hip is stable.  She can be discharged to home today.

## 2024-01-07 NOTE — Evaluation (Addendum)
 Occupational Therapy Evaluation Patient Details Name: Alexis Braun MRN: [id number] DOB: Jun 25, 1950 Today's Date: 01/07/2024   History of Present Illness   Pt s/p R THR and with hx of CAD, congenital hearing loss and NSVT     Clinical Impressions PTA, patient lives at home alone, was independent with mobility and A/IADL's including driving.  Currently, patient presents with deficits outlined below (see OT Problem List for details) most significantly mild pain, decreased balance and activity tolerance for BADL's and functional mobility performance. Patient will have some friend support/assist and is hiring intermittent caregiver support. Has shower chair and reacher (trained in use for LB dressing) having RW delivered and OT issued long handled sponge for bathing. Patient requires continued Acute care hospital level OT services to progress safety and functional performance and allow for discharge following MD recommendations for follow up.       If plan is discharge home, recommend the following:   A little help with walking and/or transfers;A little help with bathing/dressing/bathroom;Assist for transportation;Help with stairs or ramp for entrance     Functional Status Assessment   Patient has had a recent decline in their functional status and demonstrates the ability to make significant improvements in function in a reasonable and predictable amount of time.     Equipment Recommendations   None recommended by OT (issued [facility] and patient has shower chair in home already)      Precautions/Restrictions   Precautions Precautions: Fall Recall of Precautions/Restrictions: Intact Restrictions Weight Bearing Restrictions Per Provider Order: Yes RLE Weight Bearing Per Provider Order: Weight bearing as tolerated     Mobility Bed Mobility Overal bed mobility:  (was at EOB and was transferring from toilet in bathroom with NT, moved to recliner, educated on use of gait belt  for R LE management)                  Transfers Overall transfer level: Needs assistance Equipment used: Rolling walker (2 wheels) Transfers: Sit to/from Stand, Bed to chair/wheelchair/BSC Sit to Stand: Modified independent (Device/Increase time)     Step pivot transfers: Supervision     General transfer comment: patient now amb to and from bathroom for toileting with Supervision approachin mod I, good use of RW during standing level grooming sink side and mirror, issued tote back for light item transport      Balance Overall balance assessment: Needs assistance Sitting-balance support: No upper extremity supported, Feet supported Sitting balance-Leahy Scale: Good     Standing balance support: During functional activity Standing balance-Leahy Scale: Fair                             ADL either performed or assessed with clinical judgement   ADL Overall ADL's : Needs assistance/impaired Eating/Feeding: Independent   Grooming: Wash/dry hands;Wash/dry face;Oral care;Sitting;Modified independent Grooming Details (indicate cue type and reason): was able to stand for hair care with sup/mod I Upper Body Bathing: Modified independent;Sitting   Lower Body Bathing: Set up;Sit to/from stand Lower Body Bathing Details (indicate cue type and reason): issued and trained in use of LH sponge Upper Body Dressing : Modified independent;Sitting   Lower Body Dressing: Minimal assistance;Sit to/from stand;With adaptive equipment Lower Body Dressing Details (indicate cue type and reason): OT training and Handout issued for use of reacher for LB dressing Toilet Transfer: Supervision/safety;Modified Independent;Rolling walker (2 wheels);Regular Teacher, Adult Education Details (indicate cue type and reason): approaching mod I Toileting- Clothing Manipulation  and Hygiene: Set up;Sit to/from stand   Tub/ International Aid/development Worker Details  (indicate cue type and reason): walk in shower trng with demo to use RW in and out with caregiver support Functional mobility during ADLs: Supervision/safety;Modified independent;Rolling walker (2 wheels) (in room/bathroom amb wiht RW) General ADL Comments: AE trng as well as ECT principles and DME use, tote bag issued and trained for light item transport     Vision Baseline Vision/History: 1 Wears glasses;0 No visual deficits              Pertinent Vitals/Pain Pain Assessment Pain Assessment: 0-10 Pain Score: 2  Pain Location: R hip Pain Descriptors / Indicators: Aching, Sore Pain Intervention(s): Monitored during session, Premedicated before session, Repositioned, Ice applied     Extremity/Trunk Assessment Upper Extremity Assessment Upper Extremity Assessment: Overall WFL for tasks assessed;Right hand dominant   Lower Extremity Assessment Lower Extremity Assessment: Defer to PT evaluation       Communication Communication Communication: Impaired Factors Affecting Communication: Hearing impaired   Cognition Arousal: Alert Behavior During Therapy: WFL for tasks assessed/performed Cognition: No apparent impairments                               Following commands: Intact       Cueing  General Comments      very low pain reported during ADL's and bathroom mobility, nursing had just removed TEDS this am thus left off, R anterior hip incision dressing intact and no edema noed in R LE           Home Living Family/patient expects to be discharged to:: Private residence Living Arrangements: Alone Available Help at Discharge: Friend(s);Available PRN/intermittently Type of Home: House Home Access: Stairs to enter [employer] of Steps: 8 Entrance Stairs-Rails: Right Home Layout: One level     Bathroom Shower/Tub: Walk-in shower;Door   [employer] Toilet: Handicapped height Bathroom Accessibility: Yes How Accessible: Accessible via  walker Home Equipment: Cane - single point;Shower seat;Adaptive equipment;Hand held shower head Adaptive Equipment: Reacher        Prior Functioning/Environment Prior Level of Function : Independent/Modified Independent                    OT Problem List: Impaired balance (sitting and/or standing);Pain   OT Treatment/Interventions: Self-care/ADL training;Energy conservation;DME and/or AE instruction;Therapeutic activities;Patient/family education;Balance training      OT Goals(Current goals can be found in the care plan section)   Acute Rehab OT Goals Patient Stated Goal: o go home safely OT Goal Formulation: With patient Time For Goal Achievement: 01/21/24 Potential to Achieve Goals: Good ADL Goals Pt Will Perform Lower Body Bathing: with modified independence;sit to/from stand;with adaptive equipment Pt Will Perform Lower Body Dressing: with modified independence;sit to/from stand;with adaptive equipment Pt Will Transfer to Toilet: with modified independence;regular height toilet Pt Will Perform Toileting - Clothing Manipulation and hygiene: with modified independence;sit to/from stand Pt Will Perform Tub/Shower Transfer: Shower transfer;with supervision;rolling walker;shower seat;ambulating Additional ADL Goal #1: Patient will teach back AE and ECT strategies for BADL's and mobility indep   OT Frequency:  Min 2X/week       AM-PAC OT 6 Clicks Daily Activity     Outcome Measure Help from another person eating meals?: None Help from another person taking care of personal grooming?: None Help from another person toileting, which includes using toliet, bedpan, or urinal?: A Little Help from another  person bathing (including washing, rinsing, drying)?: A Little Help from another person to put on and taking off regular upper body clothing?: None Help from another person to put on and taking off regular lower body clothing?: A Little 6 Click Score: 21   End of  Session Equipment Utilized During Treatment: Gait belt;Rolling walker (2 wheels) Nurse Communication: Mobility status  Activity Tolerance: Patient tolerated treatment well Patient left: in chair;with call bell/phone within reach;with chair alarm set  OT Visit Diagnosis: Unsteadiness on feet (R26.81);Pain Pain - Right/Left: Right Pain - part of body: Hip                Time: 9097-9057 OT Time Calculation (min): 40 min Charges:  OT General Charges $OT Visit: 1 Visit OT Evaluation $OT Eval Low Complexity: 1 Low OT Treatments $Self Care/Home Management : 8-22 mins $Therapeutic Activity: 8-22 mins  Cynitha Berte OT/L Acute Rehabilitation Department  [phone]  01/07/2024, 10:37 AM

## 2024-01-07 NOTE — Progress Notes (Signed)
 Patient ID: Alexis Braun, female   DOB: May 01, 1950, 73 y.o.   MRN: [id number] I am currently operating at [facility]  My plan is to check on the patient around noon or so and see about discharging her to home later this afternoon.

## 2024-01-07 NOTE — TOC Progression Note (Signed)
 Transition of Care [facility] - Progression Note    Patient Details  Name: Esha Fincher MRN: [id number] Date of Birth: 1950-04-14  Transition of Care [facility]) CM/SW Contact  Alfonse JONELLE Rex, RN Phone Number: 01/07/2024, 12:51 PM  Clinical Narrative:   RW delivered to bedside by [employer]      Barriers to Discharge: No Barriers Identified               Expected Discharge Plan and Services         Expected Discharge Date: 01/07/24                                     Social Drivers of Health (SDOH) Interventions SDOH Screenings   Food Insecurity: No Food Insecurity (01/03/2024)  Housing: Low Risk (01/03/2024)  Transportation Needs: No Transportation Needs (01/03/2024)  Utilities: Not At Risk (01/03/2024)  Depression (PHQ2-9): Low Risk (02/04/2023)  Social Connections: Moderately Integrated (01/03/2024)  Tobacco Use: Low Risk (01/03/2024)    Readmission Risk Interventions    01/06/2024   10:53 AM  Readmission Risk Prevention Plan  Post Dischage Appt Complete  Medication Screening Complete  Transportation Screening Complete

## 2024-01-07 NOTE — Progress Notes (Signed)
 Discharge medication delivered to patient at the bedside

## 2024-01-17 NOTE — Therapy (Signed)
 " OUTPATIENT PHYSICAL THERAPY UPPER/LOWER EXTREMITY TREATMENT   Patient Name: Alexis Braun MRN: [id number] DOB:[birth year], 74 y.o., female Today's Date: 01/20/2024  END OF SESSION:  PT End of Session - 01/20/24 1559     Visit Number 3    Number of Visits 17    Date for Recertification  02/20/24    PT Start Time 1515    PT Stop Time 1545    PT Time Calculation (min) 30 min    Activity Tolerance Patient tolerated treatment well    Behavior During Therapy [facility] for tasks assessed/performed            Past Medical History:  Diagnosis Date   Allergy    sulfa   Anxiety    Arthritis    Bladder prolapse, female, acquired    CAD (coronary artery disease), native coronary artery 12/2022   coronary calcium  score of 54.9 with 25 to 49% mid LAD stenosis   Chest pain    per pt, over couple years   Congenital hearing loss    right   Gallbladder polyp 2014   GERD (gastroesophageal reflux disease)    Hepatitis    as teen-iv drugs   Hx of adenomatous and sessile serrated colonic polyps 12/24/2017   Hypertension    on medication   Hypoglycemia    Internal hemorrhoid    Migraines    NSVT (nonsustained ventricular tachycardia) (HCC)    noted on ziopatch 06/2023 lasting 13 beats   Ovarian cyst 04/03/1999   PONV (postoperative nausea and vomiting)    Positive hepatitis C antibody test    at age 48; has been told that she self cured No current or past treatment.   Sleep apnea    uses c-pap   SVT (supraventricular tachycardia)    noted on Ziopatch 06/2023 lasting 11 beats   Wears hearing aid    right   Past Surgical History:  Procedure Laterality Date   CHOLECYSTECTOMY N/A 10/08/2012   Procedure: LAPAROSCOPIC CHOLECYSTECTOMY WITH INTRAOPERATIVE CHOLANGIOGRAM;  Surgeon: Donnice POUR. Belinda, MD;  Location: [facility];  Service: General;  Laterality: N/A;   CHONDROPLASTY Right 03/31/2014   Procedure: CHONDROPLASTY;  Surgeon: Dempsey Sensor, MD;  Location: [facility];   Service: Orthopedics;  Laterality: Right;   COLONOSCOPY     2007   DRUG INDUCED ENDOSCOPY N/A 03/08/2021   Procedure: DRUG INDUCED SLEEP ENDOSCOPY;  Surgeon: Carlie Clark, MD;  Location: [facility];  Service: ENT;  Laterality: N/A;   HEMORRHOID BANDING  2017   KNEE ARTHROSCOPY WITH LATERAL MENISECTOMY Right 03/31/2014   Procedure: KNEE ARTHROSCOPY WITH  PARTIAL LATERAL MENISECTOMY;  Surgeon: Dempsey Sensor, MD;  Location: [facility];  Service: Orthopedics;  Laterality: Right;   KNEE ARTHROSCOPY WITH MEDIAL MENISECTOMY Right 03/31/2014   Procedure: KNEE ARTHROSCOPY WITH PARTIALMEDIAL MENISECTOMY;  Surgeon: Dempsey Sensor, MD;  Location: [facility];  Service: Orthopedics;  Laterality: Right;   OOPHORECTOMY  2002   Right    scelero     SIGMOIDOSCOPY     1999   TOTAL HIP ARTHROPLASTY Right 01/03/2024   Procedure: ARTHROPLASTY, HIP, TOTAL, ANTERIOR APPROACH;  Surgeon: Vernetta Lonni GRADE, MD;  Location: [facility];  Service: Orthopedics;  Laterality: Right;   Patient Active Problem List   Diagnosis Date Noted   Status post total replacement of right hip 01/03/2024   SVT (supraventricular tachycardia)    NSVT (nonsustained ventricular tachycardia) (HCC)    CAD (coronary artery disease), native coronary  artery 12/2022   Dysphagia 04/21/2020   LLQ abdominal pain 04/15/2019   Gassiness 04/15/2019   Hx of adenomatous and sessile serrated colonic polyps 12/24/2017   Bladder prolapse, female, acquired    Fecal soiling due to fecal incontinence 06/17/2015   Perianal dermatitis 06/17/2015   Hemorrhoids, internal, with bleeding 06/11/2014   Internal hemorrhoids 05/21/2014   Gallbladder polyp 09/30/2012   Migraine 08/12/2012   GERD (gastroesophageal reflux disease) 01/30/2012   Mixed hyperlipidemia 05/01/2011   Chest pain 05/04/2010    PCP: Stephane Leita DEL, MD   REFERRING PROVIDER: Burnetta Brunet, DO   REFERRING DIAG:  Diagnosis  M25.511,G89.29  (ICD-10-CM) - Chronic right shoulder pain  M17.11 (ICD-10-CM) - Unilateral primary osteoarthritis, right knee  M75.41 (ICD-10-CM) - Impingement syndrome of right shoulder  M62.830 (ICD-10-CM) - Spasm of right trapezius muscle    THERAPY DIAG:  Chronic right shoulder pain  Stiffness of right shoulder, not elsewhere classified  Chronic pain of right knee  Stiffness of right knee, not elsewhere classified  Acute pain of right shoulder  Rationale for Evaluation and Treatment: Rehabilitation  ONSET DATE: chronic  SUBJECTIVE:                                                                                                                                                                                      SUBJECTIVE STATEMENT: Pt  is now s/p R THA.  She is doing well.  Returns today for shoulder exercises again.  States shoulder pain is minimal to zero. Hand dominance: Right PERTINENT HISTORY: Pt is scheduled for R THA in 2 weeks   PAIN:  Are you having pain? Yes: NPRS scale: 1/10 Pain location: R ant shoulder, R knee Pain description: sharp, stiff Aggravating factors: motion Relieving factors: none  PRECAUTIONS: Anterior hip surgery R 01/03/24  RED FLAGS: None   WEIGHT BEARING RESTRICTIONS: No  FALLS:  Has patient fallen in last 6 months? No  LIVING ENVIRONMENT: Lives with: lives alone Lives in: House/apartment Stairs: Yes: Internal: 12 steps; on right going up Has following equipment at home: None  OCCUPATION: retired  PLOF: Independent  PATIENT GOALS: decrease pain with sports  NEXT MD VISIT: 01/03/24  OBJECTIVE:  Note: Objective measures were completed at Evaluation unless otherwise noted.  DIAGNOSTIC FINDINGS:  12/10/23 4 view x-ray of the right shoulder including AP, Grashey, scapular Y and  axial view was ordered and reviewed by myself today.  There is only  minimal arthritic change within the glenohumeral joint.  There is moderate  AC joint  narrowing.  There is sclerosis with spurring off the greater  tuberosity of the humeral head, likely indicative of impingement.  There  is undersurface spurring of the acromion.  No acute fracture noted.   12/10/23 4 views of the right knee including standing AP, Rosenberg, lateral and  sunrise views was ordered and reviewed by myself today.  There is at  moderate+ medial tibiofemoral joint space narrowing with arthritic change  as well as mild patellofemoral arthralgia.  Lateral joint line is  well-preserved.  No acute fracture noted.  No soft tissue abnormality.   PATIENT SURVEYS :  PSFS: THE PATIENT SPECIFIC FUNCTIONAL SCALE  Place score of 0-10 (0 = unable to perform activity and 10 = able to perform activity at the same level as before injury or problem)  Activity Date: 12/25/23    Yoga  3    2.Cycling 0    3.swimming  6    4.      Total Score 3      Total Score = Sum of activity scores/number of activities  Minimally Detectable Change: 3 points (for single activity); 2 points (for average score)  [facility] (nd). The Patient Specific Functional Scale . Retrieved from [email or web address]   COGNITION: Overall cognitive status: Within functional limits for tasks assessed     SENSATION: WFL  POSTURE: R LE ER , R lilac lower  UPPER EXTREMITY ROM:   Active/Passive ROM Right eval Left eval  Shoulder flexion 170ERP   Shoulder extension    Shoulder abduction 170ERP   Shoulder adduction    Shoulder internal rotation WNL ERP   Shoulder external rotation 70ERP   Elbow flexion    Elbow extension    Wrist flexion    Wrist extension    Wrist ulnar deviation    Wrist radial deviation    Wrist pronation    Wrist supination    (Blank rows = not tested)  UPPER EXTREMITY MMT:  MMT Right eval Left eval  Shoulder flexion 5-   Shoulder extension    Shoulder abduction 5-   Shoulder adduction     Shoulder internal rotation 5   Shoulder external rotation 5-   Middle trapezius    Lower trapezius    Elbow flexion    Elbow extension    Wrist flexion    Wrist extension    Wrist ulnar deviation    Wrist radial deviation    Wrist pronation    Wrist supination    Grip strength (lbs)    (Blank rows = not tested)  SHOULDER SPECIAL TESTS: Impingement tests: Hawkins/Kennedy impingement test: negative and Painful arc test: negative Rotator cuff assessment: Drop arm test: negative and Empty can test: negative  LOWER EXTREMITY ROM:     Active/Passive Right eval Left eval  Hip flexion    Hip extension    Hip abduction    Hip adduction    Hip internal rotation    Hip external rotation    Knee flexion 114/118   Knee extension +5/0   Ankle dorsiflexion    Ankle plantarflexion    Ankle inversion    Ankle eversion     (Blank rows = not tested)   LOWER EXTREMITY MMT:    MMT Right eval Left eval  Hip flexion 4   Hip extension    Hip abduction 4+   Hip adduction    Hip internal rotation    Hip external rotation    Knee flexion 4+   Knee extension 4+   Ankle dorsiflexion 5   Ankle plantarflexion    Ankle inversion    Ankle eversion     (  Blank rows = not tested)   LE Functional test-  sit to stand 5 x --6 sec  Moderate restriction of R rectus and hip flexors.  JOINT MOBILITY TESTING:  NA  PALPATION:  1+ medial patella and jt line                                                                                                                              TREATMENT DATE: R knee/ Rt shoulder 01/20/24 UBE 2 min each way TB rows 3x10 blue TB ext 3x10 blue TB IR 3x10 Blue TB ER 3x10 Blue  TB LPD 3x10 Blue Scaption 3x10 2#   12/30/23 UBE 2 min each way no resistance TB rows 3x10 blue TB ext 3x10 blue TB IR 3x10 Blue TB ER 3x10 Blue  TB LPD 3x10 Blue  Shoulder HEP  Access Code: [email or web address] URL: [email or web address] Date: 12/30/2023 Prepared  by: Burnard Meth  Program Notes All of these can be done when you are off walker from hip surgery- Do NOT do if balance is off.  Wait @3  weeks post op.  Exercises - Standing Shoulder Row with Anchored Resistance  - 2 x daily - 7 x weekly - 2 sets - 10 reps - Shoulder External Rotation with Anchored Resistance  - 2 x daily - 7 x weekly - 2 sets - 10 reps - Standing Shoulder Internal Rotation with Anchored Resistance  - 2 x daily - 7 x weekly - 2 sets - 10 reps - Shoulder Extension with Resistance  - 2 x daily - 7 x weekly - 2 sets - 10 reps - Standing Lat Pull Down with Resistance - Elbows Bent  - 2 x daily - 7 x weekly - 2 sets - 10 reps    12/25/23 Initial evaluation of R shoulder and R knee completed followed by instruction and trial set of HEP.     PATIENT EDUCATION: Education details: HEP Person educated: Patient Education method: Programmer, Multimedia, Facilities Manager, Actor cues, Verbal cues, and Handouts Education comprehension: verbalized understanding, returned demonstration, and verbal cues required  HOME EXERCISE PROGRAM: Access Code: [email or web address] URL: [email or web address] Date: 12/25/2023 Prepared by: Burnard Meth  Exercises - Hip Flexor Stretch at [facility] of Bed  - 2 x daily - 7 x weekly - 1 sets - 3 reps - 25 hold - Supine Quadriceps Stretch with Strap on Table  - 2 x daily - 7 x weekly - 1 sets - 3 reps - 25 hold  ASSESSMENT:  CLINICAL IMPRESSION: Pt needed occasional VC for TB placement but doing well with exercises.  Added scaption with light weights and reprinted HEP.  STG completed.  OBJECTIVE IMPAIRMENTS: decreased strength, impaired flexibility, and pain.   ACTIVITY LIMITATIONS: sports  PARTICIPATION LIMITATIONS: sports  PERSONAL FACTORS: upcoming surgery are also affecting patient's functional outcome.   REHAB POTENTIAL: Good  CLINICAL DECISION MAKING: Stable/uncomplicated  EVALUATION COMPLEXITY: Low  GOALS: Goals reviewed with patient?  Yes  SHORT TERM GOALS:  Target date: 01/16/2024 3 weeks MET    Patient to be independent with HEP. Baseline: Goal status:MET 01/20/24  2.  Decrease pain by 1 level. Baseline:  Goal status: MET 01/20/24    LONG TERM GOALS: Target date: 02/20/2024  8 weeks    Patient to be independent with self progressive HEP for shoulder and knee at discharge. Baseline:  Goal status: Initial  2. Decrease end range pain in shoulder active range of motion. Baseline:  Goal status: Initial  3.   Decrease max pain to 2 out of 10 in right upper extremity and right knee. Baseline:  Goal status: Initial 4.  Increase strength of right upper extremity by half a grade where needed. Baseline:  Goal status: Initial  5.  Increase strength of right knee musculature by half a grade where needed. Baseline:  Goal status: Initial  6. Improve score of PSFS by 3 points for measurable difference.  PLAN: PT FREQUENCY: 1-2x/week  PT DURATION: 8 weeks  PLANNED INTERVENTIONS: 97164- PT Re-evaluation, 97110-Therapeutic exercises, 97530- Therapeutic activity, 97112- Neuromuscular re-education, 97535- Self Care, [id number]- Manual therapy, [phone]- Aquatic Therapy, [id number]- Electrical stimulation (unattended), 20560 (1-2 muscles), 20561 (3+ muscles)- Dry Needling, Patient/Family education, Taping, Joint mobilization, Cryotherapy, and Moist heat  PLAN FOR NEXT SESSION: Pt to continue with HEP for shoulder and her HEP given to her from HHPT for the R hip.  No exercises given for R LE by me.  Pt reports getting rides to PT will be difficult, so she will continue with all HEP and call at end of month to re start PT.   Burnard CHRISTELLA Meth, PT 01/20/2024, 4:00 PM  "

## 2024-01-20 ENCOUNTER — Encounter: Admitting: Orthopaedic Surgery

## 2024-01-20 ENCOUNTER — Ambulatory Visit

## 2024-01-20 ENCOUNTER — Encounter: Payer: Self-pay | Admitting: Orthopaedic Surgery

## 2024-01-20 DIAGNOSIS — Z96641 Presence of right artificial hip joint: Secondary | ICD-10-CM

## 2024-01-20 DIAGNOSIS — M25511 Pain in right shoulder: Secondary | ICD-10-CM | POA: Diagnosis not present

## 2024-01-20 DIAGNOSIS — G8929 Other chronic pain: Secondary | ICD-10-CM

## 2024-01-20 DIAGNOSIS — M25561 Pain in right knee: Secondary | ICD-10-CM | POA: Diagnosis not present

## 2024-01-20 DIAGNOSIS — M25661 Stiffness of right knee, not elsewhere classified: Secondary | ICD-10-CM

## 2024-01-20 DIAGNOSIS — M25611 Stiffness of right shoulder, not elsewhere classified: Secondary | ICD-10-CM | POA: Diagnosis not present

## 2024-01-20 NOTE — Progress Notes (Signed)
 The patient is a 74 year old who is here for first postoperative visit status post a right total hip arthroplasty.  She is actually ambulating excellent and has had a home exercise program that she is adhering to.  She very highly motivated and she understands we want to slow her down just a little bit.  Her right hip incision looks great.  Staples are removed and Steri-Strips applied.  There is swelling to be expected but not significant.  Her calf is soft.  She has been compliant with a baby aspirin  twice daily.  She will slowly increase her activities as comfort allows.  We talked about restrictions for her and the things to do and not do.  She can stop her baby aspirin  twice daily.  She can get her incision wet in the shower tomorrow and will let the Steri-Strips fall off on their own.  We will see her back in a month to see how she is doing overall but no x-rays are needed.

## 2024-01-22 ENCOUNTER — Encounter

## 2024-01-24 LAB — LAB REPORT - SCANNED
A1c: 5
EGFR: 82.5

## 2024-01-27 ENCOUNTER — Encounter

## 2024-01-29 ENCOUNTER — Encounter

## 2024-02-17 ENCOUNTER — Ambulatory Visit: Admitting: Orthopaedic Surgery

## 2024-03-02 ENCOUNTER — Ambulatory Visit: Admitting: Physician Assistant
